# Patient Record
Sex: Male | Born: 1965 | Race: White | Hispanic: No | Marital: Married | Smoking: Never smoker
Health system: Southern US, Community
[De-identification: ages and names within clinical notes are randomized; demographics above are authoritative.]

## PROBLEM LIST (undated history)

## (undated) DIAGNOSIS — E785 Hyperlipidemia, unspecified: Secondary | ICD-10-CM

## (undated) DIAGNOSIS — I10 Essential (primary) hypertension: Secondary | ICD-10-CM

## (undated) DIAGNOSIS — E119 Type 2 diabetes mellitus without complications: Secondary | ICD-10-CM

## (undated) HISTORY — DX: Essential (primary) hypertension: I10

## (undated) HISTORY — DX: Hyperlipidemia, unspecified: E78.5

## (undated) HISTORY — DX: Type 2 diabetes mellitus without complications: E11.9

---

## 2010-07-31 DIAGNOSIS — I152 Hypertension secondary to endocrine disorders: Secondary | ICD-10-CM | POA: Insufficient documentation

## 2010-07-31 DIAGNOSIS — N138 Other obstructive and reflux uropathy: Secondary | ICD-10-CM | POA: Insufficient documentation

## 2010-07-31 DIAGNOSIS — N400 Enlarged prostate without lower urinary tract symptoms: Secondary | ICD-10-CM | POA: Insufficient documentation

## 2010-07-31 DIAGNOSIS — I10 Essential (primary) hypertension: Secondary | ICD-10-CM | POA: Insufficient documentation

## 2016-07-19 DIAGNOSIS — E1169 Type 2 diabetes mellitus with other specified complication: Secondary | ICD-10-CM | POA: Insufficient documentation

## 2016-07-19 DIAGNOSIS — E782 Mixed hyperlipidemia: Secondary | ICD-10-CM | POA: Insufficient documentation

## 2018-07-09 LAB — COLOGUARD: Cologuard: NEGATIVE

## 2019-12-28 DIAGNOSIS — E1141 Type 2 diabetes mellitus with diabetic mononeuropathy: Secondary | ICD-10-CM | POA: Insufficient documentation

## 2020-11-08 ENCOUNTER — Ambulatory Visit: Payer: Self-pay | Admitting: Family Medicine

## 2020-12-23 ENCOUNTER — Other Ambulatory Visit: Payer: Self-pay

## 2020-12-23 ENCOUNTER — Encounter: Payer: Self-pay | Admitting: Family Medicine

## 2020-12-23 ENCOUNTER — Ambulatory Visit (INDEPENDENT_AMBULATORY_CARE_PROVIDER_SITE_OTHER): Payer: 59 | Admitting: Family Medicine

## 2020-12-23 VITALS — BP 153/84 | HR 105 | Temp 98.2°F | Ht 67.0 in | Wt 177.6 lb

## 2020-12-23 DIAGNOSIS — E782 Mixed hyperlipidemia: Secondary | ICD-10-CM | POA: Diagnosis not present

## 2020-12-23 DIAGNOSIS — E1141 Type 2 diabetes mellitus with diabetic mononeuropathy: Secondary | ICD-10-CM

## 2020-12-23 DIAGNOSIS — I10 Essential (primary) hypertension: Secondary | ICD-10-CM

## 2020-12-23 LAB — BAYER DCA HB A1C WAIVED: HB A1C (BAYER DCA - WAIVED): 11.7 % — ABNORMAL HIGH (ref <7.0)

## 2020-12-23 MED ORDER — LOSARTAN POTASSIUM 100 MG PO TABS: 1.0000 | ORAL_TABLET | Freq: Every day | ORAL | 1 refills | Status: DC

## 2020-12-23 MED ORDER — GLIMEPIRIDE 2 MG PO TABS: 2.0000 mg | ORAL_TABLET | Freq: Two times a day (BID) | ORAL | 1 refills | Status: DC

## 2020-12-23 MED ORDER — DAPAGLIFLOZIN PROPANEDIOL 10 MG PO TABS: ORAL_TABLET | ORAL | 2 refills | Status: DC

## 2020-12-23 MED ORDER — AMLODIPINE BESYLATE 10 MG PO TABS: 1.0000 | ORAL_TABLET | Freq: Every day | ORAL | 1 refills | Status: DC

## 2020-12-23 MED ORDER — METFORMIN HCL ER 500 MG PO TB24
500.0000 mg | ORAL_TABLET | Freq: Two times a day (BID) | ORAL | 1 refills | Status: DC
Start: 2020-12-23 — End: 2021-06-20

## 2020-12-23 MED ORDER — ATORVASTATIN CALCIUM 20 MG PO TABS: 1.0000 | ORAL_TABLET | Freq: Every day | ORAL | 1 refills | Status: DC

## 2020-12-23 MED ORDER — HYDROCHLOROTHIAZIDE 25 MG PO TABS: 1.0000 | ORAL_TABLET | Freq: Every day | ORAL | 1 refills | Status: DC

## 2020-12-23 NOTE — Patient Instructions (Addendum)
Please monitor your blood pressure and blood sugar and keep a log to bring back with you.   Diabetes Mellitus and Nutrition, Adult When you have diabetes, or diabetes mellitus, it is very important to have healthy eating habits because your blood sugar (glucose) levels are greatly affected by what you eat and drink. Eating healthy foods in the right amounts, at about the same times every day, can help you:  Control your blood glucose.  Lower your risk of heart disease.  Improve your blood pressure.  Reach or maintain a healthy weight. What can affect my meal plan? Every person with diabetes is different, and each person has different needs for a meal plan. Your health care provider may recommend that you work with a dietitian to make a meal plan that is best for you. Your meal plan may vary depending on factors such as:  The calories you need.  The medicines you take.  Your weight.  Your blood glucose, blood pressure, and cholesterol levels.  Your activity level.  Other health conditions you have, such as heart or kidney disease. How do carbohydrates affect me? Carbohydrates, also called carbs, affect your blood glucose level more than any other type of food. Eating carbs naturally raises the amount of glucose in your blood. Carb counting is a method for keeping track of how many carbs you eat. Counting carbs is important to keep your blood glucose at a healthy level, especially if you use insulin or take certain oral diabetes medicines. It is important to know how many carbs you can safely have in each meal. This is different for every person. Your dietitian can help you calculate how many carbs you should have at each meal and for each snack. How does alcohol affect me? Alcohol can cause a sudden decrease in blood glucose (hypoglycemia), especially if you use insulin or take certain oral diabetes medicines. Hypoglycemia can be a life-threatening condition. Symptoms of hypoglycemia,  such as sleepiness, dizziness, and confusion, are similar to symptoms of having too much alcohol.  Do not drink alcohol if: ? Your health care provider tells you not to drink. ? You are pregnant, may be pregnant, or are planning to become pregnant.  If you drink alcohol: ? Do not drink on an empty stomach. ? Limit how much you use to:  0-1 drink a day for women.  0-2 drinks a day for men. ? Be aware of how much alcohol is in your drink. In the [place] one drink equals one 12 oz bottle of beer (355 mL), one 5 oz glass of wine (148 mL), or one 1 oz glass of hard liquor (44 mL). ? Keep yourself hydrated with water, diet soda, or unsweetened iced tea.  Keep in mind that regular soda, juice, and other mixers may contain a lot of sugar and must be counted as carbs. What are tips for following this plan? Reading food labels  Start by checking the serving size on the "Nutrition Facts" label of packaged foods and drinks. The amount of calories, carbs, fats, and other nutrients listed on the label is based on one serving of the item. Many items contain more than one serving per package.  Check the total grams (g) of carbs in one serving. You can calculate the number of servings of carbs in one serving by dividing the total carbs by 15. For example, if a food has 30 g of total carbs per serving, it would be equal to 2 servings of carbs.  Check the number of grams (g) of saturated fats and trans fats in one serving. Choose foods that have a low amount or none of these fats.  Check the number of milligrams (mg) of salt (sodium) in one serving. Most people should limit total sodium intake to less than 2,300 mg per day.  Always check the nutrition information of foods labeled as "low-fat" or "nonfat." These foods may be higher in added sugar or refined carbs and should be avoided.  Talk to your dietitian to identify your daily goals for nutrients listed on the label. Shopping  Avoid buying canned,  pre-made, or processed foods. These foods tend to be high in fat, sodium, and added sugar.  Shop around the outside edge of the grocery store. This is where you will most often find fresh fruits and vegetables, bulk grains, fresh meats, and fresh dairy. Cooking  Use low-heat cooking methods, such as baking, instead of high-heat cooking methods like deep frying.  Cook using healthy oils, such as olive, canola, or sunflower oil.  Avoid cooking with butter, cream, or high-fat meats. Meal planning  Eat meals and snacks regularly, preferably at the same times every day. Avoid going long periods of time without eating.  Eat foods that are high in fiber, such as fresh fruits, vegetables, beans, and whole grains. Talk with your dietitian about how many servings of carbs you can eat at each meal.  Eat 4-6 oz (112-168 g) of lean protein each day, such as lean meat, chicken, fish, eggs, or tofu. One ounce (oz) of lean protein is equal to: ? 1 oz (28 g) of meat, chicken, or fish. ? 1 egg. ?  cup (62 g) of tofu.  Eat some foods each day that contain healthy fats, such as avocado, nuts, seeds, and fish.   What foods should I eat? Fruits Berries. Apples. Oranges. Peaches. Apricots. Plums. Grapes. Mango. Papaya. Pomegranate. Kiwi. Cherries. Vegetables Lettuce. Spinach. Leafy greens, including kale, chard, collard greens, and mustard greens. Beets. Cauliflower. Cabbage. Broccoli. Carrots. Green beans. Tomatoes. Peppers. Onions. Cucumbers. Brussels sprouts. Grains Whole grains, such as whole-wheat or whole-grain bread, crackers, tortillas, cereal, and pasta. Unsweetened oatmeal. Quinoa. Brown or wild rice. Meats and other proteins Seafood. Poultry without skin. Lean cuts of poultry and beef. Tofu. Nuts. Seeds. Dairy Low-fat or fat-free dairy products such as milk, yogurt, and cheese. The items listed above may not be a complete list of foods and beverages you can eat. Contact a dietitian for more  information. What foods should I avoid? Fruits Fruits canned with syrup. Vegetables Canned vegetables. Frozen vegetables with butter or cream sauce. Grains Refined white flour and flour products such as bread, pasta, snack foods, and cereals. Avoid all processed foods. Meats and other proteins Fatty cuts of meat. Poultry with skin. Breaded or fried meats. Processed meat. Avoid saturated fats. Dairy Full-fat yogurt, cheese, or milk. Beverages Sweetened drinks, such as soda or iced tea. The items listed above may not be a complete list of foods and beverages you should avoid. Contact a dietitian for more information. Questions to ask a health care provider  Do I need to meet with a diabetes educator?  Do I need to meet with a dietitian?  What number can I call if I have questions?  When are the best times to check my blood glucose? Where to find more information:  American Diabetes Association: diabetes.org  Academy of Nutrition and Dietetics: www.eatright.[employer] of Diabetes and Digestive and Kidney Diseases:  [employer]  Association of Diabetes Care and Education Specialists: www.diabeteseducator.org Summary  It is important to have healthy eating habits because your blood sugar (glucose) levels are greatly affected by what you eat and drink.  A healthy meal plan will help you control your blood glucose and maintain a healthy lifestyle.  Your health care provider may recommend that you work with a dietitian to make a meal plan that is best for you.  Keep in mind that carbohydrates (carbs) and alcohol have immediate effects on your blood glucose levels. It is important to count carbs and to use alcohol carefully. This information is not intended to replace advice given to you by your health care provider. Make sure you discuss any questions you have with your health care provider. Document Revised: 07/28/2019 Document Reviewed: 07/28/2019 Elsevier  Patient Education  2021 [employer].

## 2020-12-23 NOTE — Progress Notes (Signed)
New Patient Office Visit  Assessment & Plan:  1. Type 2 diabetes mellitus with diabetic mononeuropathy, without long-term current use of insulin (HCC) Lab Results  Component Value Date   HGBA1C 11.7 (H) 12/23/2020    - Diabetes is not at goal of A1c < 7. - Medications: continue current medications, add [place] glucose monitoring: Continue monitoring - Patient is currently taking a statin. Patient is taking an ACE-inhibitor/ARB.  - Instruction/counseling given: discussed diet and provided printed educational material  Diabetes Health Maintenance Due  Topic Date Due  . FOOT EXAM  Never done  . OPHTHALMOLOGY EXAM  Never done  . HEMOGLOBIN A1C  06/24/2021    - metFORMIN (GLUCOPHAGE-XR) 500 MG 24 hr tablet; Take 1 tablet (500 mg total) by mouth 2 (two) times daily.  Dispense: 180 tablet; Refill: 1 - glimepiride (AMARYL) 2 MG tablet; Take 1 tablet (2 mg total) by mouth in the morning and at bedtime.  Dispense: 90 tablet; Refill: 1 - dapagliflozin propanediol (FARXIGA) 10 MG TABS tablet; Take 5 mg by mouth once daily x1 week, then increase to 10 mg once daily.  Dispense: 30 tablet; Refill: 2 - CBC with Differential/Platelet - CMP14+EGFR - Lipid panel - Vitamin B12 - Bayer DCA Hb A1c Waived  2. Essential hypertension Uncontrolled.  Patient to monitor his blood pressure at home and keep a log to bring back with him to his next appointment. - amLODipine (NORVASC) 10 MG tablet; Take 1 tablet (10 mg total) by mouth daily.  Dispense: 90 tablet; Refill: 1 - hydrochlorothiazide (HYDRODIURIL) 25 MG tablet; Take 1 tablet (25 mg total) by mouth daily.  Dispense: 90 tablet; Refill: 1 - losartan (COZAAR) 100 MG tablet; Take 1 tablet (100 mg total) by mouth daily.  Dispense: 90 tablet; Refill: 1 - CBC with Differential/Platelet - CMP14+EGFR - Lipid panel  3. Mixed hyperlipidemia Labs to assess. - atorvastatin (LIPITOR) 20 MG tablet; Take 1 tablet (20 mg total) by mouth daily.  Dispense:  90 tablet; Refill: 1 - CMP14+EGFR - Lipid panel   Follow-up: Return in about 6 weeks (around 02/03/2021) for DM, HTN.   Hendricks Limes, MSN, APRN, FNP-C [facility]  Subjective:  Patient ID: Ranulfo Kall, male    DOB: 11/30/1965  Age: 55 y.o. MRN: [id number]  Patient Care Team: Loman Brooklyn, FNP as PCP - General (Family Medicine)  CC:  Chief Complaint  Patient presents with  . New Patient (Initial Visit)    [facility]  . Establish Care    HPI Wilberth Damon presents to establish care.   Diabetes: Current symptoms include: hyperglycemia. Known diabetic complications: none. Medication compliance: Taking metformin 500 mg twice daily and glimepiride 2 mg twice daily.  He does not tolerate higher doses of metformin due to diarrhea. Current diet: in general, a "healthy" diet  . Current exercise: reports he is increasing his exercise now that it is getting warm. Home blood sugar records: high - 266 yesterday. Is he  on ACE inhibitor or angiotensin II receptor blocker? Yes. Is he on a statin? Yes.    Hypertension: Patient does check his blood pressure at home and states it is normally 140s/85.  He does not add salt to his food.  He is starting to exercise more now that it is getting warm out.   Review of Systems  Constitutional: Negative for chills, fever, malaise/fatigue and weight loss.  HENT: Negative for congestion, ear discharge, ear pain, nosebleeds, sinus pain, sore throat and tinnitus.  Eyes: Negative for blurred vision, double vision, pain, discharge and redness.  Respiratory: Negative for cough, shortness of breath and wheezing.   Cardiovascular: Negative for chest pain, palpitations and leg swelling.  Gastrointestinal: Negative for abdominal pain, constipation, diarrhea, heartburn, nausea and vomiting.  Genitourinary: Negative for dysuria, frequency and urgency.  Musculoskeletal: Negative for myalgias.  Skin: Negative for rash.  Neurological: Negative for  dizziness, seizures, weakness and headaches.  Psychiatric/Behavioral: Negative for depression, substance abuse and suicidal ideas. The patient is not nervous/anxious.     Current Outpatient Medications:  .  amLODipine (NORVASC) 10 MG tablet, Take 1 tablet by mouth daily., Disp: , Rfl:  .  aspirin 81 MG EC tablet, Take 1 tablet by mouth daily., Disp: , Rfl:  .  atorvastatin (LIPITOR) 20 MG tablet, Take 1 tablet by mouth daily., Disp: , Rfl:  .  glimepiride (AMARYL) 4 MG tablet, Take 4 mg by mouth 2 (two) times daily., Disp: , Rfl:  .  hydrochlorothiazide (HYDRODIURIL) 25 MG tablet, Take 1 tablet by mouth daily at 2 PM., Disp: , Rfl:  .  losartan (COZAAR) 100 MG tablet, Take 1 tablet by mouth daily., Disp: , Rfl:  .  metFORMIN (GLUCOPHAGE-XR) 500 MG 24 hr tablet, Take 500 mg by mouth 2 (two) times daily., Disp: , Rfl:   Allergies  Allergen Reactions  . Ace Inhibitors Swelling and Other (See Comments)    Tongue swelling and cough.     Past Medical History:  Diagnosis Date  . Diabetes mellitus without complication ([place])   . Hyperlipidemia   . Hypertension     History reviewed. No pertinent surgical history.  Family History  Problem Relation Age of Onset  . Hypertension Mother   . Heart disease Sister   . Heart attack Maternal Grandfather     Social History   Socioeconomic History  . Marital status: Married    Spouse name: Not on file  . Number of children: Not on file  . Years of education: Not on file  . Highest education level: Not on file  Occupational History  . Not on file  Tobacco Use  . Smoking status: Never Smoker  . Smokeless tobacco: Never Used  Vaping Use  . Vaping Use: Never used  Substance and Sexual Activity  . Alcohol use: Never  . Drug use: Never  . Sexual activity: Yes  Other Topics Concern  . Not on file  Social History Narrative  . Not on file   Social Determinants of Health   Financial Resource Strain: Not on file  Food Insecurity: Not on  file  Transportation Needs: Not on file  Physical Activity: Not on file  Stress: Not on file  Social Connections: Not on file  Intimate Partner Violence: Not on file    Objective:   Today's Vitals: BP (!) 153/84   Pulse (!) 105   Temp 98.2 F (36.8 C) (Temporal)   Ht '5\' 7"'  (1.702 m)   Wt 177 lb 9.6 oz (80.6 kg)   SpO2 98%   BMI 27.82 kg/m   Physical Exam Vitals reviewed.  Constitutional:      General: He is not in acute distress.    Appearance: Normal appearance. He is overweight. He is not ill-appearing, toxic-appearing or diaphoretic.  HENT:     Head: Normocephalic and atraumatic.  Eyes:     General: No scleral icterus.       Right eye: No discharge.        Left eye: No  discharge.     Conjunctiva/sclera: Conjunctivae normal.  Cardiovascular:     Rate and Rhythm: Normal rate and regular rhythm.     Heart sounds: Normal heart sounds. No murmur heard. No friction rub. No gallop.   Pulmonary:     Effort: Pulmonary effort is normal. No respiratory distress.     Breath sounds: Normal breath sounds. No stridor. No wheezing, rhonchi or rales.  Musculoskeletal:        General: Normal range of motion.     Cervical back: Normal range of motion.  Skin:    General: Skin is warm and dry.  Neurological:     Mental Status: He is alert and oriented to person, place, and time. Mental status is at baseline.  Psychiatric:        Mood and Affect: Mood normal.        Behavior: Behavior normal.        Thought Content: Thought content normal.        Judgment: Judgment normal.

## 2020-12-24 LAB — CBC WITH DIFFERENTIAL/PLATELET
Basophils Absolute: 0.1 10*3/uL (ref 0.0–0.2)
Basos: 1 %
EOS (ABSOLUTE): 0.1 10*3/uL (ref 0.0–0.4)
Eos: 1 %
Hematocrit: 48.4 % (ref 37.5–51.0)
Hemoglobin: 16.6 g/dL (ref 13.0–17.7)
Immature Grans (Abs): 0 10*3/uL (ref 0.0–0.1)
Immature Granulocytes: 1 %
Lymphocytes Absolute: 1.4 10*3/uL (ref 0.7–3.1)
Lymphs: 17 %
MCH: 29.7 pg (ref 26.6–33.0)
MCHC: 34.3 g/dL (ref 31.5–35.7)
MCV: 87 fL (ref 79–97)
Monocytes Absolute: 0.6 10*3/uL (ref 0.1–0.9)
Monocytes: 7 %
Neutrophils Absolute: 6.4 10*3/uL (ref 1.4–7.0)
Neutrophils: 73 %
Platelets: 273 10*3/uL (ref 150–450)
RBC: 5.58 x10E6/uL (ref 4.14–5.80)
RDW: 12.2 % (ref 11.6–15.4)
WBC: 8.7 10*3/uL (ref 3.4–10.8)

## 2020-12-24 LAB — CMP14+EGFR
ALT: 26 IU/L (ref 0–44)
AST: 16 IU/L (ref 0–40)
Albumin/Globulin Ratio: 1.8 (ref 1.2–2.2)
Albumin: 5.1 g/dL — ABNORMAL HIGH (ref 3.8–4.9)
Alkaline Phosphatase: 72 IU/L (ref 44–121)
BUN/Creatinine Ratio: 15 (ref 9–20)
BUN: 16 mg/dL (ref 6–24)
Bilirubin Total: 0.5 mg/dL (ref 0.0–1.2)
CO2: 21 mmol/L (ref 20–29)
Calcium: 10.4 mg/dL — ABNORMAL HIGH (ref 8.7–10.2)
Chloride: 95 mmol/L — ABNORMAL LOW (ref 96–106)
Creatinine, Ser: 1.1 mg/dL (ref 0.76–1.27)
Globulin, Total: 2.8 g/dL (ref 1.5–4.5)
Glucose: 391 mg/dL — ABNORMAL HIGH (ref 65–99)
Potassium: 4.8 mmol/L (ref 3.5–5.2)
Sodium: 137 mmol/L (ref 134–144)
Total Protein: 7.9 g/dL (ref 6.0–8.5)
eGFR: 80 mL/min/{1.73_m2} (ref >59)

## 2020-12-24 LAB — LIPID PANEL
Chol/HDL Ratio: 7.3 ratio — ABNORMAL HIGH (ref 0.0–5.0)
Cholesterol, Total: 242 mg/dL — ABNORMAL HIGH (ref 100–199)
HDL: 33 mg/dL — ABNORMAL LOW (ref >39)
LDL Chol Calc (NIH): 156 mg/dL — ABNORMAL HIGH (ref 0–99)
Triglycerides: 285 mg/dL — ABNORMAL HIGH (ref 0–149)
VLDL Cholesterol Cal: 53 mg/dL — ABNORMAL HIGH (ref 5–40)

## 2020-12-24 LAB — VITAMIN B12: Vitamin B-12: 1208 pg/mL (ref 232–1245)

## 2020-12-26 ENCOUNTER — Other Ambulatory Visit: Payer: Self-pay | Admitting: Family Medicine

## 2020-12-26 DIAGNOSIS — E782 Mixed hyperlipidemia: Secondary | ICD-10-CM

## 2020-12-26 MED ORDER — ATORVASTATIN CALCIUM 40 MG PO TABS: 40.0000 mg | ORAL_TABLET | Freq: Every day | ORAL | 2 refills | Status: DC

## 2020-12-26 NOTE — Progress Notes (Signed)
ORDER INFORMATION Patient: Matthew Mann, Matthew Mann Date of Birth: 1966/06/24 Medical Record #: [id number]  Sex: Male Authorizing Provider: Georgeanne Nim, NP Client Order ID: [id number] Report Date: 07/23/2018 Specimen ID: [id number] Specimen Collected: 07/09/2018 7:20 AM Specimen Received: 07/09/2018 Specimen Type: Stool Specimen Source: Per Rectum       Cologuard Order: [id number] Status: Final result   0 Result Notes     1                 Ref Range & Units 07/09/18  7:20 AM Test Result Not Applicable Negative   Results Negative per cologuard

## 2021-01-14 ENCOUNTER — Other Ambulatory Visit: Payer: Self-pay | Admitting: Family Medicine

## 2021-01-14 DIAGNOSIS — E1141 Type 2 diabetes mellitus with diabetic mononeuropathy: Secondary | ICD-10-CM

## 2021-01-30 ENCOUNTER — Other Ambulatory Visit: Payer: Self-pay | Admitting: Family Medicine

## 2021-01-30 DIAGNOSIS — E1141 Type 2 diabetes mellitus with diabetic mononeuropathy: Secondary | ICD-10-CM

## 2021-02-03 ENCOUNTER — Ambulatory Visit: Payer: 59 | Admitting: Family Medicine

## 2021-02-08 ENCOUNTER — Encounter: Payer: Self-pay | Admitting: Family Medicine

## 2021-02-08 DIAGNOSIS — E1141 Type 2 diabetes mellitus with diabetic mononeuropathy: Secondary | ICD-10-CM

## 2021-02-08 DIAGNOSIS — E782 Mixed hyperlipidemia: Secondary | ICD-10-CM

## 2021-02-08 MED ORDER — DAPAGLIFLOZIN PROPANEDIOL 10 MG PO TABS: ORAL_TABLET | ORAL | 0 refills | Status: DC

## 2021-02-08 MED ORDER — ATORVASTATIN CALCIUM 20 MG PO TABS: 20.0000 mg | ORAL_TABLET | Freq: Every day | ORAL | 0 refills | Status: DC

## 2021-02-21 ENCOUNTER — Other Ambulatory Visit: Payer: Self-pay

## 2021-02-21 ENCOUNTER — Encounter: Payer: Self-pay | Admitting: Family Medicine

## 2021-02-21 ENCOUNTER — Ambulatory Visit (INDEPENDENT_AMBULATORY_CARE_PROVIDER_SITE_OTHER): Payer: 59 | Admitting: Family Medicine

## 2021-02-21 VITALS — BP 154/92 | HR 90 | Temp 97.3°F | Ht 67.0 in | Wt 174.4 lb

## 2021-02-21 DIAGNOSIS — I10 Essential (primary) hypertension: Secondary | ICD-10-CM

## 2021-02-21 DIAGNOSIS — B349 Viral infection, unspecified: Secondary | ICD-10-CM

## 2021-02-21 DIAGNOSIS — E1141 Type 2 diabetes mellitus with diabetic mononeuropathy: Secondary | ICD-10-CM

## 2021-02-21 MED ORDER — GLIMEPIRIDE 4 MG PO TABS: 4.0000 mg | ORAL_TABLET | Freq: Two times a day (BID) | ORAL | 1 refills | Status: DC

## 2021-02-21 NOTE — Progress Notes (Signed)
Assessment & Plan:  1. Type 2 diabetes mellitus with diabetic mononeuropathy, without long-term current use of insulin (HCC) Lab Results  Component Value Date   HGBA1C 11.7 (H) 12/23/2020    - Diabetes is not at goal of A1c < 7. - Medications: continue current medications. Patient declines a medication change until follow-up A1c.  - Home glucose monitoring: continue monitoring - Patient is currently taking a statin. Patient is taking an ACE-inhibitor/ARB.  - Instruction/counseling given: reminded to get eye exam and discussed foot care  Diabetes Health Maintenance Due  Topic Date Due   OPHTHALMOLOGY EXAM  Never done   HEMOGLOBIN A1C  06/24/2021   FOOT EXAM  02/21/2022    - glimepiride (AMARYL) 4 MG tablet; Take 1 tablet (4 mg total) by mouth in the morning and at bedtime.  Dispense: 180 tablet; Refill: 1  2. Essential hypertension Well controlled by home readings.   3. Viral illness Discussed viral illness. Does not sound related to tick bite.    Return in about 2 months (around 04/23/2021) for DM.  Hendricks Limes, MSN, APRN, FNP-C [facility]  Subjective:    Patient ID: Matthew Mann, male    DOB: May 26, 1966, 55 y.o.   MRN: [id number]  Patient Care Team: Loman Brooklyn, FNP as PCP - General (Family Medicine)   Chief Complaint:  Chief Complaint  Patient presents with   Diabetes   Hypertension    6 week follow up     HPI: Matthew Mann is a 55 y.o. male presenting on 02/21/2021 for Diabetes and Hypertension (6 week follow up )  Diabetes: Patient presents for follow up of diabetes. Current symptoms include: hyperglycemia. Known diabetic complications: none. Medication compliance: yes. Current diet: in general, a "healthy" diet  . Current exercise: bicycling. Home blood sugar records:  mostly <200 since starting Farxiga; none over 300; 130 was the best reading . Is he  on ACE inhibitor or angiotensin II receptor blocker? Yes. Is he on a statin? Yes.    Hypertension: patient does check his blood pressure at home and reports readings 120-130/70-85. States he is always higher at the doctor's office.  New complaints: Patient reports he has had a sore throat intermittently over the past month. None for the past week. He had a fever x2 days in the past month but not at the same time as his sore throat. His grandson lives with him and goes to daycare; states he is always bringing illnesses home. He has got multiple ticks off of him this year but denies headaches, nausea, vomiting, rash, or increased joint pain.   Social history:  Relevant past medical, surgical, family and social history reviewed and updated as indicated. Interim medical history since our last visit reviewed.  Allergies and medications reviewed and updated.  DATA REVIEWED: CHART IN EPIC  ROS: Negative unless specifically indicated above in HPI.    Current Outpatient Medications:    amLODipine (NORVASC) 10 MG tablet, Take 1 tablet (10 mg total) by mouth daily., Disp: 90 tablet, Rfl: 1   aspirin 81 MG EC tablet, Take 1 tablet by mouth daily., Disp: , Rfl:    atorvastatin (LIPITOR) 20 MG tablet, Take 1 tablet (20 mg total) by mouth daily., Disp: 90 tablet, Rfl: 0   dapagliflozin propanediol (FARXIGA) 10 MG TABS tablet, Take 5 mg by mouth once daily x1 week, then increase to 10 mg once daily., Disp: 90 tablet, Rfl: 0   glimepiride (AMARYL) 2 MG tablet, TAKE 1  TABLET (2 MG TOTAL) BY MOUTH IN THE MORNING AND AT BEDTIME., Disp: 180 tablet, Rfl: 1   hydrochlorothiazide (HYDRODIURIL) 25 MG tablet, Take 1 tablet (25 mg total) by mouth daily., Disp: 90 tablet, Rfl: 1   losartan (COZAAR) 100 MG tablet, Take 1 tablet (100 mg total) by mouth daily., Disp: 90 tablet, Rfl: 1   metFORMIN (GLUCOPHAGE-XR) 500 MG 24 hr tablet, Take 1 tablet (500 mg total) by mouth 2 (two) times daily., Disp: 180 tablet, Rfl: 1   Allergies  Allergen Reactions   Ace Inhibitors Swelling and Other (See Comments)     Tongue swelling and cough.    Past Medical History:  Diagnosis Date   Diabetes mellitus without complication ([place])    Hyperlipidemia    Hypertension     History reviewed. No pertinent surgical history.  Social History   Socioeconomic History   Marital status: Married    Spouse name: Not on file   Number of children: Not on file   Years of education: Not on file   Highest education level: Not on file  Occupational History   Not on file  Tobacco Use   Smoking status: Never   Smokeless tobacco: Never  Vaping Use   Vaping Use: Never used  Substance and Sexual Activity   Alcohol use: Never   Drug use: Never   Sexual activity: Yes  Other Topics Concern   Not on file  Social History Narrative   Not on file   Social Determinants of Health   Financial Resource Strain: Not on file  Food Insecurity: Not on file  Transportation Needs: Not on file  Physical Activity: Not on file  Stress: Not on file  Social Connections: Not on file  Intimate Partner Violence: Not on file        Objective:    BP (!) 154/92   Pulse 90   Temp (!) 97.3 F (36.3 C) (Temporal)   Ht '5\' 7"'  (1.702 m)   Wt 174 lb 6.4 oz (79.1 kg)   SpO2 100%   BMI 27.31 kg/m   Wt Readings from Last 3 Encounters:  02/21/21 174 lb 6.4 oz (79.1 kg)  12/23/20 177 lb 9.6 oz (80.6 kg)    Physical Exam Vitals reviewed.  Constitutional:      General: He is not in acute distress.    Appearance: Normal appearance. He is not ill-appearing, toxic-appearing or diaphoretic.  HENT:     Head: Normocephalic and atraumatic.  Eyes:     General: No scleral icterus.       Right eye: No discharge.        Left eye: No discharge.     Conjunctiva/sclera: Conjunctivae normal.  Cardiovascular:     Rate and Rhythm: Normal rate and regular rhythm.     Heart sounds: Normal heart sounds. No murmur heard.   No friction rub. No gallop.  Pulmonary:     Effort: Pulmonary effort is normal. No respiratory distress.      Breath sounds: Normal breath sounds. No stridor. No wheezing, rhonchi or rales.  Musculoskeletal:        General: Normal range of motion.     Cervical back: Normal range of motion.  Skin:    General: Skin is warm and dry.     Comments: Area of tick bite to the left side of his abdomen with mild reactive erythema. No s/s of infection.  Neurological:     Mental Status: He is alert and oriented to person,  place, and time. Mental status is at baseline.  Psychiatric:        Mood and Affect: Mood normal.        Behavior: Behavior normal.        Thought Content: Thought content normal.        Judgment: Judgment normal.    No results found for: TSH Lab Results  Component Value Date   WBC 8.7 12/23/2020   HGB 16.6 12/23/2020   HCT 48.4 12/23/2020   MCV 87 12/23/2020   PLT 273 12/23/2020   Lab Results  Component Value Date   NA 137 12/23/2020   K 4.8 12/23/2020   CO2 21 12/23/2020   GLUCOSE 391 (H) 12/23/2020   BUN 16 12/23/2020   CREATININE 1.10 12/23/2020   BILITOT 0.5 12/23/2020   ALKPHOS 72 12/23/2020   AST 16 12/23/2020   ALT 26 12/23/2020   PROT 7.9 12/23/2020   ALBUMIN 5.1 (H) 12/23/2020   CALCIUM 10.4 (H) 12/23/2020   EGFR 80 12/23/2020   Lab Results  Component Value Date   CHOL 242 (H) 12/23/2020   Lab Results  Component Value Date   HDL 33 (L) 12/23/2020   Lab Results  Component Value Date   LDLCALC 156 (H) 12/23/2020   Lab Results  Component Value Date   TRIG 285 (H) 12/23/2020   Lab Results  Component Value Date   CHOLHDL 7.3 (H) 12/23/2020   Lab Results  Component Value Date   HGBA1C 11.7 (H) 12/23/2020

## 2021-04-24 ENCOUNTER — Ambulatory Visit: Payer: 59 | Admitting: Family Medicine

## 2021-04-27 ENCOUNTER — Other Ambulatory Visit: Payer: Self-pay

## 2021-04-27 ENCOUNTER — Encounter: Payer: Self-pay | Admitting: Family Medicine

## 2021-04-27 ENCOUNTER — Ambulatory Visit (INDEPENDENT_AMBULATORY_CARE_PROVIDER_SITE_OTHER): Payer: 59 | Admitting: Family Medicine

## 2021-04-27 VITALS — BP 125/86 | HR 93 | Temp 97.6°F | Ht 67.0 in | Wt 172.0 lb

## 2021-04-27 DIAGNOSIS — M5431 Sciatica, right side: Secondary | ICD-10-CM | POA: Diagnosis not present

## 2021-04-27 DIAGNOSIS — E1141 Type 2 diabetes mellitus with diabetic mononeuropathy: Secondary | ICD-10-CM | POA: Diagnosis not present

## 2021-04-27 DIAGNOSIS — I10 Essential (primary) hypertension: Secondary | ICD-10-CM | POA: Diagnosis not present

## 2021-04-27 DIAGNOSIS — E782 Mixed hyperlipidemia: Secondary | ICD-10-CM | POA: Diagnosis not present

## 2021-04-27 LAB — BAYER DCA HB A1C WAIVED: HB A1C (BAYER DCA - WAIVED): 8.9 % — ABNORMAL HIGH (ref <7.0)

## 2021-04-27 MED ORDER — PREDNISONE 10 MG (21) PO TBPK: ORAL_TABLET | ORAL | 0 refills | Status: DC

## 2021-04-27 NOTE — Progress Notes (Signed)
Assessment & Plan:  1. Type 2 diabetes mellitus with diabetic mononeuropathy, without long-term current use of insulin (HCC) Lab Results  Component Value Date   HGBA1C 8.9 (H) 04/27/2021   HGBA1C 11.7 (H) 12/23/2020    - Diabetes is not at goal of A1c < 7, but is improving. - Medications: continue current medications. Strongly encouraged adding a GLP or new oral medication but patient is not agreeable to adding a medication as he wants to finish getting his A1c down with diet. - Home glucose monitoring: Continue monitoring. - Patient is currently taking a statin. Patient is taking an ACE-inhibitor/ARB.  - Instruction/counseling given: reminded to get eye exam  Diabetes Health Maintenance Due  Topic Date Due   OPHTHALMOLOGY EXAM  Never done   HEMOGLOBIN A1C  10/28/2021   FOOT EXAM  02/21/2022    - Lipid panel - CBC with Differential/Platelet - CMP14+EGFR - Bayer DCA Hb A1c Waived  2. Essential hypertension Well controlled on current regimen per home readings. - Lipid panel - CBC with Differential/Platelet - CMP14+EGFR  3. Mixed hyperlipidemia Labs to assess since increasing Atorvastatin from 20 mg to 40 mg once daily. - Lipid panel - CMP14+EGFR  4. Right sided sciatica Education provided on sciatica. - predniSONE (STERAPRED UNI-PAK 21 TAB) 10 MG (21) TBPK tablet; As directed x 6 days  Dispense: 21 tablet; Refill: 0   Return in about 3 months (around 07/28/2021) for annual physical.  Matthew Limes, MSN, APRN, FNP-C Matthew Mann Matthew Mann  Subjective:    Patient ID: Matthew Mann, male    DOB: 07/13/1966, 55 y.o.   MRN: [id number]  Patient Care Team: Matthew Brooklyn, FNP as PCP - General (Matthew Mann) [place]   Chief Complaint:  Chief Complaint  Patient presents with   Diabetes   Hypertension    6 week follow up   Leg Pain    Patient states he has been having right leg pain x1 months. Pain goes down leg.     HPI: Matthew Mann is a  55 y.o. male presenting on 04/27/2021 for Diabetes, Hypertension (6 week follow up), and Leg Pain (Patient states he has been having right leg pain x1 months. Pain goes down leg. )  Diabetes with hypertension and hyperlipidemia: Patient presents for follow up of diabetes. Current symptoms include: hyperglycemia. Known diabetic complications: none. Medication compliance: yes. Current diet:  doing better cutting out sweets and not snacking in the evening . Current exercise:  "some" . Home blood sugar records: BGs range between 150 and 230 . Is he  on ACE inhibitor or angiotensin II receptor blocker? Yes. Is he on a statin? Yes.   Hypertension: patient does check his blood pressure at home and reports readings 120-130/70-85. States he is always higher at the doctor's office. He was 125/86 at home this morning.  New complaints: Patient reports right sided sciatic pain x1 month that started after he did a lot of walking. He has been trying to do exercises at home to help and taking Ibuprofen which is somewhat helpful.  Patient reports having COVID a few weeks ago.   Social history:  Relevant past medical, surgical, Matthew and social history reviewed and updated as indicated. Interim medical history since our last visit reviewed.  Allergies and medications reviewed and updated.  DATA REVIEWED: CHART IN EPIC  ROS: Negative unless specifically indicated above in HPI.    Current Outpatient Medications:    amLODipine (NORVASC) 10 MG tablet, Take 1 tablet (  10 mg total) by mouth daily., Disp: 90 tablet, Rfl: 1   aspirin 81 MG EC tablet, Take 1 tablet by mouth daily., Disp: , Rfl:    atorvastatin (LIPITOR) 20 MG tablet, Take 1 tablet (20 mg total) by mouth daily., Disp: 90 tablet, Rfl: 0   dapagliflozin propanediol (FARXIGA) 10 MG TABS tablet, Take 5 mg by mouth once daily x1 week, then increase to 10 mg once daily., Disp: 90 tablet, Rfl: 0   glimepiride (AMARYL) 4 MG tablet, Take 1 tablet (4 mg total)  by mouth in the morning and at bedtime., Disp: 180 tablet, Rfl: 1   hydrochlorothiazide (HYDRODIURIL) 25 MG tablet, Take 1 tablet (25 mg total) by mouth daily., Disp: 90 tablet, Rfl: 1   losartan (COZAAR) 100 MG tablet, Take 1 tablet (100 mg total) by mouth daily., Disp: 90 tablet, Rfl: 1   metFORMIN (GLUCOPHAGE-XR) 500 MG 24 hr tablet, Take 1 tablet (500 mg total) by mouth 2 (two) times daily., Disp: 180 tablet, Rfl: 1   Allergies  Allergen Reactions   Ace Inhibitors Swelling and Other (See Comments)    Tongue swelling and cough.    Past Medical History:  Diagnosis Date   Diabetes mellitus without complication ([place])    Hyperlipidemia    Hypertension     History reviewed. No pertinent surgical history.  Social History   Socioeconomic History   Marital status: Married    Spouse name: Not on file   Number of children: Not on file   Years of education: Not on file   Highest education level: Not on file  Occupational History   Not on file  Tobacco Use   Smoking status: Never   Smokeless tobacco: Never  Vaping Use   Vaping Use: Never used  Substance and Sexual Activity   Alcohol use: Never   Drug use: Never   Sexual activity: Yes  Other Topics Concern   Not on file  Social History Narrative   Not on file   Social Determinants of Health   Financial Resource Strain: Not on file  Food Insecurity: Not on file  Transportation Needs: Not on file  Physical Activity: Not on file  Stress: Not on file  Social Connections: Not on file  Intimate Partner Violence: Not on file        Objective:    BP 125/86   Pulse 93   Temp 97.6 F (36.4 C) (Temporal)   Ht '5\' 7"'  (1.702 m)   Wt 172 lb (78 kg)   SpO2 100%   BMI 26.94 kg/m   Wt Readings from Last 3 Encounters:  04/27/21 172 lb (78 kg)  02/21/21 174 lb 6.4 oz (79.1 kg)  12/23/20 177 lb 9.6 oz (80.6 kg)    Physical Exam Vitals reviewed.  Constitutional:      General: He is not in acute distress.    Appearance:  Normal appearance. He is overweight. He is not ill-appearing, toxic-appearing or diaphoretic.  HENT:     Head: Normocephalic and atraumatic.  Eyes:     General: No scleral icterus.       Right eye: No discharge.        Left eye: No discharge.     Conjunctiva/sclera: Conjunctivae normal.  Cardiovascular:     Rate and Rhythm: Normal rate and regular rhythm.     Heart sounds: Normal heart sounds. No murmur heard.   No friction rub. No gallop.  Pulmonary:     Effort: Pulmonary effort is normal.  No respiratory distress.     Breath sounds: Normal breath sounds. No stridor. No wheezing, rhonchi or rales.  Musculoskeletal:        General: Normal range of motion.     Cervical back: Normal range of motion.  Skin:    General: Skin is warm and dry.  Neurological:     Mental Status: He is alert and oriented to person, place, and time. Mental status is at baseline.  Psychiatric:        Mood and Affect: Mood normal.        Behavior: Behavior normal.        Thought Content: Thought content normal.        Judgment: Judgment normal.    No results found for: TSH Lab Results  Component Value Date   WBC 8.7 12/23/2020   HGB 16.6 12/23/2020   HCT 48.4 12/23/2020   MCV 87 12/23/2020   PLT 273 12/23/2020   Lab Results  Component Value Date   NA 137 12/23/2020   K 4.8 12/23/2020   CO2 21 12/23/2020   GLUCOSE 391 (H) 12/23/2020   BUN 16 12/23/2020   CREATININE 1.10 12/23/2020   BILITOT 0.5 12/23/2020   ALKPHOS 72 12/23/2020   AST 16 12/23/2020   ALT 26 12/23/2020   PROT 7.9 12/23/2020   ALBUMIN 5.1 (H) 12/23/2020   CALCIUM 10.4 (H) 12/23/2020   EGFR 80 12/23/2020   Lab Results  Component Value Date   CHOL 242 (H) 12/23/2020   Lab Results  Component Value Date   HDL 33 (L) 12/23/2020   Lab Results  Component Value Date   LDLCALC 156 (H) 12/23/2020   Lab Results  Component Value Date   TRIG 285 (H) 12/23/2020   Lab Results  Component Value Date   CHOLHDL 7.3 (H)  12/23/2020   Lab Results  Component Value Date   HGBA1C 11.7 (H) 12/23/2020

## 2021-04-28 LAB — CMP14+EGFR
ALT: 30 IU/L (ref 0–44)
AST: 19 IU/L (ref 0–40)
Albumin/Globulin Ratio: 1.9 (ref 1.2–2.2)
Albumin: 5.1 g/dL — ABNORMAL HIGH (ref 3.8–4.9)
Alkaline Phosphatase: 75 IU/L (ref 44–121)
BUN/Creatinine Ratio: 14 (ref 9–20)
BUN: 15 mg/dL (ref 6–24)
Bilirubin Total: 1 mg/dL (ref 0.0–1.2)
CO2: 22 mmol/L (ref 20–29)
Calcium: 10.4 mg/dL — ABNORMAL HIGH (ref 8.7–10.2)
Chloride: 98 mmol/L (ref 96–106)
Creatinine, Ser: 1.06 mg/dL (ref 0.76–1.27)
Globulin, Total: 2.7 g/dL (ref 1.5–4.5)
Glucose: 192 mg/dL — ABNORMAL HIGH (ref 65–99)
Potassium: 4.2 mmol/L (ref 3.5–5.2)
Sodium: 138 mmol/L (ref 134–144)
Total Protein: 7.8 g/dL (ref 6.0–8.5)
eGFR: 84 mL/min/{1.73_m2} (ref >59)

## 2021-04-28 LAB — CBC WITH DIFFERENTIAL/PLATELET
Basophils Absolute: 0.1 10*3/uL (ref 0.0–0.2)
Basos: 1 %
EOS (ABSOLUTE): 0.3 10*3/uL (ref 0.0–0.4)
Eos: 3 %
Hematocrit: 46.9 % (ref 37.5–51.0)
Hemoglobin: 16.2 g/dL (ref 13.0–17.7)
Immature Grans (Abs): 0 10*3/uL (ref 0.0–0.1)
Immature Granulocytes: 0 %
Lymphocytes Absolute: 2.5 10*3/uL (ref 0.7–3.1)
Lymphs: 25 %
MCH: 29.4 pg (ref 26.6–33.0)
MCHC: 34.5 g/dL (ref 31.5–35.7)
MCV: 85 fL (ref 79–97)
Monocytes Absolute: 0.7 10*3/uL (ref 0.1–0.9)
Monocytes: 7 %
Neutrophils Absolute: 6.3 10*3/uL (ref 1.4–7.0)
Neutrophils: 64 %
Platelets: 282 10*3/uL (ref 150–450)
RBC: 5.51 x10E6/uL (ref 4.14–5.80)
RDW: 11.7 % (ref 11.6–15.4)
WBC: 9.8 10*3/uL (ref 3.4–10.8)

## 2021-04-28 LAB — LIPID PANEL
Chol/HDL Ratio: 4.1 ratio (ref 0.0–5.0)
Cholesterol, Total: 153 mg/dL (ref 100–199)
HDL: 37 mg/dL — ABNORMAL LOW (ref >39)
LDL Chol Calc (NIH): 91 mg/dL (ref 0–99)
Triglycerides: 143 mg/dL (ref 0–149)
VLDL Cholesterol Cal: 25 mg/dL (ref 5–40)

## 2021-05-31 ENCOUNTER — Other Ambulatory Visit: Payer: Self-pay | Admitting: Family Medicine

## 2021-05-31 DIAGNOSIS — I10 Essential (primary) hypertension: Secondary | ICD-10-CM

## 2021-05-31 DIAGNOSIS — E1141 Type 2 diabetes mellitus with diabetic mononeuropathy: Secondary | ICD-10-CM

## 2021-06-04 ENCOUNTER — Other Ambulatory Visit: Payer: Self-pay | Admitting: Family Medicine

## 2021-06-04 DIAGNOSIS — E782 Mixed hyperlipidemia: Secondary | ICD-10-CM

## 2021-06-14 ENCOUNTER — Other Ambulatory Visit: Payer: Self-pay

## 2021-06-14 ENCOUNTER — Encounter (HOSPITAL_COMMUNITY)
Admission: EM | Disposition: A | Discharge status: Home / Self Care | Payer: Self-pay | Source: Home / Self Care | Attending: Cardiovascular Disease

## 2021-06-14 ENCOUNTER — Emergency Department (HOSPITAL_COMMUNITY): Payer: 59

## 2021-06-14 ENCOUNTER — Inpatient Hospital Stay (HOSPITAL_COMMUNITY): Payer: 59

## 2021-06-14 ENCOUNTER — Encounter (HOSPITAL_COMMUNITY): Payer: Self-pay | Admitting: *Deleted

## 2021-06-14 ENCOUNTER — Inpatient Hospital Stay (HOSPITAL_COMMUNITY)
Admission: EM | Admit: 2021-06-14 | Discharge: 2021-06-23 | DRG: 234 | Disposition: A | Discharge status: Home / Self Care | Payer: 59 | Attending: Thoracic Surgery (Cardiothoracic Vascular Surgery) | Admitting: Thoracic Surgery (Cardiothoracic Vascular Surgery)

## 2021-06-14 DIAGNOSIS — I2510 Atherosclerotic heart disease of native coronary artery without angina pectoris: Secondary | ICD-10-CM | POA: Diagnosis present

## 2021-06-14 DIAGNOSIS — D72829 Elevated white blood cell count, unspecified: Secondary | ICD-10-CM | POA: Diagnosis not present

## 2021-06-14 DIAGNOSIS — E1141 Type 2 diabetes mellitus with diabetic mononeuropathy: Secondary | ICD-10-CM | POA: Diagnosis present

## 2021-06-14 DIAGNOSIS — N400 Enlarged prostate without lower urinary tract symptoms: Secondary | ICD-10-CM | POA: Diagnosis present

## 2021-06-14 DIAGNOSIS — R911 Solitary pulmonary nodule: Secondary | ICD-10-CM | POA: Diagnosis present

## 2021-06-14 DIAGNOSIS — D62 Acute posthemorrhagic anemia: Secondary | ICD-10-CM | POA: Diagnosis not present

## 2021-06-14 DIAGNOSIS — Z8249 Family history of ischemic heart disease and other diseases of the circulatory system: Secondary | ICD-10-CM | POA: Diagnosis not present

## 2021-06-14 DIAGNOSIS — Z20822 Contact with and (suspected) exposure to covid-19: Secondary | ICD-10-CM | POA: Diagnosis present

## 2021-06-14 DIAGNOSIS — I252 Old myocardial infarction: Secondary | ICD-10-CM | POA: Diagnosis not present

## 2021-06-14 DIAGNOSIS — I214 Non-ST elevation (NSTEMI) myocardial infarction: Principal | ICD-10-CM | POA: Diagnosis present

## 2021-06-14 DIAGNOSIS — E119 Type 2 diabetes mellitus without complications: Secondary | ICD-10-CM | POA: Diagnosis present

## 2021-06-14 DIAGNOSIS — E782 Mixed hyperlipidemia: Secondary | ICD-10-CM | POA: Diagnosis present

## 2021-06-14 DIAGNOSIS — E8770 Fluid overload, unspecified: Secondary | ICD-10-CM | POA: Diagnosis not present

## 2021-06-14 DIAGNOSIS — Z79899 Other long term (current) drug therapy: Secondary | ICD-10-CM

## 2021-06-14 DIAGNOSIS — R42 Dizziness and giddiness: Secondary | ICD-10-CM | POA: Diagnosis present

## 2021-06-14 DIAGNOSIS — Z09 Encounter for follow-up examination after completed treatment for conditions other than malignant neoplasm: Secondary | ICD-10-CM

## 2021-06-14 DIAGNOSIS — I152 Hypertension secondary to endocrine disorders: Secondary | ICD-10-CM | POA: Diagnosis present

## 2021-06-14 DIAGNOSIS — Z888 Allergy status to other drugs, medicaments and biological substances status: Secondary | ICD-10-CM

## 2021-06-14 DIAGNOSIS — I10 Essential (primary) hypertension: Secondary | ICD-10-CM | POA: Diagnosis present

## 2021-06-14 DIAGNOSIS — K5900 Constipation, unspecified: Secondary | ICD-10-CM | POA: Diagnosis not present

## 2021-06-14 DIAGNOSIS — I25110 Atherosclerotic heart disease of native coronary artery with unstable angina pectoris: Secondary | ICD-10-CM | POA: Diagnosis not present

## 2021-06-14 DIAGNOSIS — Z7984 Long term (current) use of oral hypoglycemic drugs: Secondary | ICD-10-CM

## 2021-06-14 DIAGNOSIS — Z7982 Long term (current) use of aspirin: Secondary | ICD-10-CM | POA: Diagnosis not present

## 2021-06-14 DIAGNOSIS — Z951 Presence of aortocoronary bypass graft: Secondary | ICD-10-CM

## 2021-06-14 DIAGNOSIS — J939 Pneumothorax, unspecified: Secondary | ICD-10-CM

## 2021-06-14 DIAGNOSIS — E1169 Type 2 diabetes mellitus with other specified complication: Secondary | ICD-10-CM | POA: Diagnosis present

## 2021-06-14 DIAGNOSIS — Z01810 Encounter for preprocedural cardiovascular examination: Secondary | ICD-10-CM | POA: Diagnosis not present

## 2021-06-14 HISTORY — PX: LEFT HEART CATH AND CORONARY ANGIOGRAPHY: CATH118249

## 2021-06-14 LAB — BASIC METABOLIC PANEL
Anion gap: 9 (ref 5–15)
BUN: 17 mg/dL (ref 6–20)
CO2: 24 mmol/L (ref 22–32)
Calcium: 9.3 mg/dL (ref 8.9–10.3)
Chloride: 104 mmol/L (ref 98–111)
Creatinine, Ser: 1.11 mg/dL (ref 0.61–1.24)
GFR, Estimated: >60 mL/min (ref >60)
Glucose, Bld: 213 mg/dL — ABNORMAL HIGH (ref 70–99)
Potassium: 3.8 mmol/L (ref 3.5–5.1)
Sodium: 137 mmol/L (ref 135–145)

## 2021-06-14 LAB — CBC
HCT: 48.5 % (ref 39.0–52.0)
Hemoglobin: 16.6 g/dL (ref 13.0–17.0)
MCH: 29.8 pg (ref 26.0–34.0)
MCHC: 34.2 g/dL (ref 30.0–36.0)
MCV: 87.1 fL (ref 80.0–100.0)
Platelets: 267 10*3/uL (ref 150–400)
RBC: 5.57 MIL/uL (ref 4.22–5.81)
RDW: 12.7 % (ref 11.5–15.5)
WBC: 9.4 10*3/uL (ref 4.0–10.5)
nRBC: 0 % (ref 0.0–0.2)

## 2021-06-14 LAB — TROPONIN I (HIGH SENSITIVITY)
Troponin I (High Sensitivity): 278 ng/L (ref <18)
Troponin I (High Sensitivity): 275 ng/L (ref <18)
Troponin I (High Sensitivity): 285 ng/L (ref <18)
Troponin I (High Sensitivity): 265 ng/L (ref <18)

## 2021-06-14 LAB — RESP PANEL BY RT-PCR (FLU A&B, COVID) ARPGX2
Influenza A by PCR: NEGATIVE
Influenza B by PCR: NEGATIVE
SARS Coronavirus 2 by RT PCR: NEGATIVE

## 2021-06-14 LAB — GLUCOSE, CAPILLARY
Glucose-Capillary: 75 mg/dL (ref 70–99)
Glucose-Capillary: 120 mg/dL — ABNORMAL HIGH (ref 70–99)

## 2021-06-14 LAB — D-DIMER, QUANTITATIVE: D-Dimer, Quant: <0.27 ug/mL-FEU (ref 0.00–0.50)

## 2021-06-14 SURGERY — LEFT HEART CATH AND CORONARY ANGIOGRAPHY
Anesthesia: LOCAL

## 2021-06-14 MED ORDER — ASPIRIN EC 81 MG PO TBEC
81.0000 mg | DELAYED_RELEASE_TABLET | Freq: Every day | ORAL | Status: DC
  Administered 2021-06-15 – 2021-06-18 (×4): 81 mg via ORAL
  Filled 2021-06-14 (×4): qty 1

## 2021-06-14 MED ORDER — SODIUM CHLORIDE 0.9% FLUSH
3.0000 mL | Freq: Two times a day (BID) | INTRAVENOUS | Status: DC
Start: 2021-06-14 — End: 2021-06-14

## 2021-06-14 MED ORDER — LOSARTAN POTASSIUM 50 MG PO TABS
100.0000 mg | ORAL_TABLET | Freq: Every day | ORAL | Status: DC
  Administered 2021-06-15 – 2021-06-18 (×4): 100 mg via ORAL
  Filled 2021-06-14 (×4): qty 2

## 2021-06-14 MED ORDER — FENTANYL CITRATE (PF) 100 MCG/2ML IJ SOLN
INTRAMUSCULAR | Status: DC | PRN
  Administered 2021-06-14: 25 ug via INTRAVENOUS

## 2021-06-14 MED ORDER — ACETAMINOPHEN 325 MG PO TABS: 650.0000 mg | ORAL_TABLET | ORAL | Status: DC | PRN

## 2021-06-14 MED ORDER — SODIUM CHLORIDE 0.9% FLUSH
3.0000 mL | Freq: Two times a day (BID) | INTRAVENOUS | Status: DC
  Administered 2021-06-16 – 2021-06-18 (×3): 3 mL via INTRAVENOUS

## 2021-06-14 MED ORDER — SODIUM CHLORIDE 0.9 % WEIGHT BASED INFUSION
1.0000 mL/kg/h | INTRAVENOUS | Status: DC
  Administered 2021-06-14: 1 mL/kg/h via INTRAVENOUS

## 2021-06-14 MED ORDER — HEPARIN SODIUM (PORCINE) 1000 UNIT/ML IJ SOLN
INTRAMUSCULAR | Status: DC | PRN
  Administered 2021-06-14: 4000 [IU] via INTRAVENOUS

## 2021-06-14 MED ORDER — ONDANSETRON HCL 4 MG/2ML IJ SOLN
4.0000 mg | Freq: Four times a day (QID) | INTRAMUSCULAR | Status: DC | PRN
Start: 2021-06-14 — End: 2021-06-14

## 2021-06-14 MED ORDER — ASPIRIN 81 MG PO CHEW
324.0000 mg | CHEWABLE_TABLET | Freq: Once | ORAL | Status: AC
  Administered 2021-06-14: 324 mg via ORAL
  Filled 2021-06-14: qty 4

## 2021-06-14 MED ORDER — SODIUM CHLORIDE 0.9% FLUSH: 3.0000 mL | INTRAVENOUS | Status: DC | PRN

## 2021-06-14 MED ORDER — SODIUM CHLORIDE 0.9 % WEIGHT BASED INFUSION: 1.0000 mL/kg/h | INTRAVENOUS | Status: AC

## 2021-06-14 MED ORDER — HEPARIN (PORCINE) IN NACL 1000-0.9 UT/500ML-% IV SOLN
INTRAVENOUS | Status: DC | PRN
  Administered 2021-06-14 (×2): 500 mL

## 2021-06-14 MED ORDER — METOPROLOL TARTRATE 25 MG PO TABS
25.0000 mg | ORAL_TABLET | Freq: Two times a day (BID) | ORAL | Status: DC
  Administered 2021-06-14 – 2021-06-18 (×10): 25 mg via ORAL
  Filled 2021-06-14 (×10): qty 1

## 2021-06-14 MED ORDER — DAPAGLIFLOZIN PROPANEDIOL 10 MG PO TABS
10.0000 mg | ORAL_TABLET | Freq: Every day | ORAL | Status: DC
  Administered 2021-06-14 – 2021-06-18 (×5): 10 mg via ORAL
  Filled 2021-06-14 (×7): qty 1

## 2021-06-14 MED ORDER — SODIUM CHLORIDE 0.9 % IV SOLN: 250.0000 mL | INTRAVENOUS | Status: DC | PRN

## 2021-06-14 MED ORDER — AMLODIPINE BESYLATE 10 MG PO TABS
10.0000 mg | ORAL_TABLET | Freq: Every day | ORAL | Status: DC
  Administered 2021-06-15 – 2021-06-18 (×4): 10 mg via ORAL
  Filled 2021-06-14 (×4): qty 1

## 2021-06-14 MED ORDER — HEPARIN (PORCINE) IN NACL 1000-0.9 UT/500ML-% IV SOLN
INTRAVENOUS | Status: AC
  Filled 2021-06-14: qty 1000

## 2021-06-14 MED ORDER — SODIUM CHLORIDE 0.9% FLUSH: 3.0000 mL | Freq: Two times a day (BID) | INTRAVENOUS | Status: DC

## 2021-06-14 MED ORDER — NITROGLYCERIN 0.4 MG SL SUBL
0.4000 mg | SUBLINGUAL_TABLET | SUBLINGUAL | Status: DC | PRN
  Administered 2021-06-14 – 2021-06-15 (×5): 0.4 mg via SUBLINGUAL
  Filled 2021-06-14 (×3): qty 1

## 2021-06-14 MED ORDER — IOHEXOL 350 MG/ML SOLN
75.0000 mL | Freq: Once | INTRAVENOUS | Status: AC | PRN
  Administered 2021-06-14: 75 mL via INTRAVENOUS

## 2021-06-14 MED ORDER — VERAPAMIL HCL 2.5 MG/ML IV SOLN
INTRAVENOUS | Status: AC
  Filled 2021-06-14: qty 2

## 2021-06-14 MED ORDER — ONDANSETRON HCL 4 MG/2ML IJ SOLN
4.0000 mg | Freq: Once | INTRAMUSCULAR | Status: AC
  Administered 2021-06-14: 4 mg via INTRAVENOUS
  Filled 2021-06-14: qty 2

## 2021-06-14 MED ORDER — LIDOCAINE HCL (PF) 1 % IJ SOLN
INTRAMUSCULAR | Status: DC | PRN
  Administered 2021-06-14: 2 mL

## 2021-06-14 MED ORDER — HYDRALAZINE HCL 20 MG/ML IJ SOLN: 10.0000 mg | INTRAMUSCULAR | Status: AC | PRN

## 2021-06-14 MED ORDER — ONDANSETRON HCL 4 MG/2ML IJ SOLN: 4.0000 mg | Freq: Four times a day (QID) | INTRAMUSCULAR | Status: DC | PRN

## 2021-06-14 MED ORDER — ASPIRIN 81 MG PO CHEW: 81.0000 mg | CHEWABLE_TABLET | ORAL | Status: AC

## 2021-06-14 MED ORDER — HEPARIN BOLUS VIA INFUSION
4000.0000 [IU] | Freq: Once | INTRAVENOUS | Status: AC
  Administered 2021-06-14: 4000 [IU] via INTRAVENOUS

## 2021-06-14 MED ORDER — SODIUM CHLORIDE 0.9 % WEIGHT BASED INFUSION
3.0000 mL/kg/h | INTRAVENOUS | Status: AC
  Administered 2021-06-14: 3 mL/kg/h via INTRAVENOUS

## 2021-06-14 MED ORDER — HEPARIN (PORCINE) 25000 UT/250ML-% IV SOLN
1200.0000 [IU]/h | INTRAVENOUS | Status: DC
  Administered 2021-06-14 – 2021-06-15 (×2): 1000 [IU]/h via INTRAVENOUS
  Administered 2021-06-16 – 2021-06-18 (×4): 1200 [IU]/h via INTRAVENOUS
  Filled 2021-06-14 (×4): qty 250

## 2021-06-14 MED ORDER — GLIMEPIRIDE 4 MG PO TABS
4.0000 mg | ORAL_TABLET | Freq: Every day | ORAL | Status: DC
  Administered 2021-06-15 – 2021-06-18 (×4): 4 mg via ORAL
  Filled 2021-06-14 (×6): qty 1

## 2021-06-14 MED ORDER — ACETAMINOPHEN 325 MG PO TABS
650.0000 mg | ORAL_TABLET | Freq: Once | ORAL | Status: AC
  Administered 2021-06-14: 650 mg via ORAL
  Filled 2021-06-14: qty 2

## 2021-06-14 MED ORDER — ATORVASTATIN CALCIUM 80 MG PO TABS
80.0000 mg | ORAL_TABLET | Freq: Every day | ORAL | Status: DC
  Administered 2021-06-14: 80 mg via ORAL
  Filled 2021-06-14: qty 2

## 2021-06-14 MED ORDER — ACETAMINOPHEN 325 MG PO TABS
650.0000 mg | ORAL_TABLET | ORAL | Status: DC | PRN
Start: 2021-06-14 — End: 2021-06-19

## 2021-06-14 MED ORDER — INSULIN ASPART 100 UNIT/ML IJ SOLN
0.0000 [IU] | Freq: Three times a day (TID) | INTRAMUSCULAR | Status: DC
  Administered 2021-06-17: 3 [IU] via SUBCUTANEOUS

## 2021-06-14 MED ORDER — FENTANYL CITRATE (PF) 100 MCG/2ML IJ SOLN
INTRAMUSCULAR | Status: AC
  Filled 2021-06-14: qty 2

## 2021-06-14 MED ORDER — LABETALOL HCL 5 MG/ML IV SOLN: 10.0000 mg | INTRAVENOUS | Status: AC | PRN

## 2021-06-14 MED ORDER — ATORVASTATIN CALCIUM 80 MG PO TABS
80.0000 mg | ORAL_TABLET | Freq: Every day | ORAL | Status: DC
  Administered 2021-06-15 – 2021-06-23 (×8): 80 mg via ORAL
  Filled 2021-06-14 (×8): qty 1

## 2021-06-14 MED ORDER — HEPARIN SODIUM (PORCINE) 1000 UNIT/ML IJ SOLN
INTRAMUSCULAR | Status: AC
  Filled 2021-06-14: qty 1

## 2021-06-14 MED ORDER — MIDAZOLAM HCL 2 MG/2ML IJ SOLN
INTRAMUSCULAR | Status: DC | PRN
  Administered 2021-06-14: 2 mg via INTRAVENOUS

## 2021-06-14 MED ORDER — MIDAZOLAM HCL 2 MG/2ML IJ SOLN
INTRAMUSCULAR | Status: AC
  Filled 2021-06-14: qty 2

## 2021-06-14 MED ORDER — VERAPAMIL HCL 2.5 MG/ML IV SOLN
INTRAVENOUS | Status: DC | PRN
  Administered 2021-06-14: 10 mL via INTRA_ARTERIAL

## 2021-06-14 MED ORDER — SODIUM CHLORIDE 0.9 % IV SOLN
250.0000 mL | INTRAVENOUS | Status: DC | PRN
Start: 2021-06-14 — End: 2021-06-14

## 2021-06-14 MED ORDER — HEPARIN (PORCINE) 25000 UT/250ML-% IV SOLN
1000.0000 [IU]/h | INTRAVENOUS | Status: DC
  Administered 2021-06-14: 1000 [IU]/h via INTRAVENOUS
  Filled 2021-06-14: qty 250

## 2021-06-14 MED ORDER — LIDOCAINE HCL (PF) 1 % IJ SOLN
INTRAMUSCULAR | Status: AC
  Filled 2021-06-14: qty 30

## 2021-06-14 MED ORDER — IOHEXOL 350 MG/ML SOLN
INTRAVENOUS | Status: DC | PRN
  Administered 2021-06-14: 85 mL

## 2021-06-14 SURGICAL SUPPLY — 11 items
CATH 5FR JL3.5 JR4 ANG PIG MP (CATHETERS) ×2 IMPLANT
CATH INFINITI 5 FR JR5 (CATHETERS) ×2 IMPLANT
DEVICE RAD COMP TR BAND LRG (VASCULAR PRODUCTS) ×2 IMPLANT
GLIDESHEATH SLEND SS 6F .021 (SHEATH) ×2 IMPLANT
GUIDEWIRE INQWIRE 1.5J.035X260 (WIRE) ×1 IMPLANT
INQWIRE 1.5J .035X260CM (WIRE) ×2
KIT HEART LEFT (KITS) ×2 IMPLANT
PACK CARDIAC CATHETERIZATION (CUSTOM PROCEDURE TRAY) ×2 IMPLANT
SYR MEDRAD MARK 7 150ML (SYRINGE) ×2 IMPLANT
TRANSDUCER W/STOPCOCK (MISCELLANEOUS) ×2 IMPLANT
TUBING CIL FLEX 10 FLL-RA (TUBING) ×2 IMPLANT

## 2021-06-14 NOTE — Progress Notes (Signed)
ANTICOAGULATION CONSULT NOTE - Initial Consult  Pharmacy Consult for heparin Indication: chest pain/ACS  Allergies  Allergen Reactions   Ace Inhibitors Swelling and Other (See Comments)    Tongue swelling and cough.     Patient Measurements: Height: 5\' 7"  (170.2 cm) Weight: 78.9 kg (174 lb) IBW/kg (Calculated) : 66.1 HEPARIN DW (KG): 78.9   Vital Signs: Temp: 98.9 F (37.2 C) (10/12 0945) Temp Source: Oral (10/12 0945) BP: 136/93 (10/12 1100) Pulse Rate: 97 (10/12 1100)  Labs: Recent Labs    06/14/21 0954  HGB 16.6  HCT 48.5  PLT 267  CREATININE 1.11  TROPONINIHS 278*    Estimated Creatinine Clearance: 72 mL/min (by C-G formula based on SCr of 1.11 mg/dL).   Medical History: Past Medical History:  Diagnosis Date   Diabetes mellitus without complication (HCC)    Hyperlipidemia    Hypertension     Medications:  See med rec  Assessment: Patient presented with chest pain that radiates into left arm and jaw. Not on any oral anticoagulation. Pharmacy asked to start heparin  Goal of Therapy:  Heparin level 0.3-0.7 units/ml Monitor platelets by anticoagulation protocol: Yes   Plan:  Give 4000 units bolus x 1 Start heparin infusion at 1000 units/hr Check anti-Xa level in ~6 hours and daily while on heparin Continue to monitor H&H and platelets  08/14/21, BS Elder Cyphers, BCPS Clinical Pharmacist Pager [phone] 06/14/2021,11:05 AM

## 2021-06-14 NOTE — ED Triage Notes (Signed)
Pt c/o left sided chest pain that radiates into left arm that started Friday. Pt reports the pain started going up into his left jaw this morning. Pt also c/o SOB, nausea, dizziness and weakness.

## 2021-06-14 NOTE — ED Notes (Signed)
RN was in the room when pt reported he was starting to feel nauseated. Emesis bag given and then pt c/o feeling sweaty and "not right". Pt laid flat and new EKG done. Upon getting EKG, pt was noted to be very diaphoretic. BP 97/71, HR 65. Dr. Charm Barges given new EKG and updated on pt's symptoms.

## 2021-06-14 NOTE — Plan of Care (Signed)

## 2021-06-14 NOTE — ED Notes (Signed)
Pt given Nitro SL for chest pain. Initially rated his pain at a 7. Five minutes afterwards pain was rated at a 1. Pt stated, "it helped my pain significantly".

## 2021-06-14 NOTE — Progress Notes (Signed)
ANTICOAGULATION CONSULT NOTE  Pharmacy Consult for heparin Indication: chest pain/ACS  Allergies  Allergen Reactions   Ace Inhibitors Swelling and Other (See Comments)    Tongue swelling and cough.     Patient Measurements: Height: 5\' 7"  (170.2 cm) Weight: 78.9 kg (174 lb) IBW/kg (Calculated) : 66.1 HEPARIN DW (KG): 78.9   Vital Signs: Temp: 97.6 F (36.4 C) (10/12 1345) Temp Source: Oral (10/12 1345) BP: 116/68 (10/12 1600) Pulse Rate: 66 (10/12 1600)  Labs: Recent Labs    06/14/21 0954 06/14/21 1139  HGB 16.6  --   HCT 48.5  --   PLT 267  --   CREATININE 1.11  --   TROPONINIHS 278* 275*     Estimated Creatinine Clearance: 72 mL/min (by C-G formula based on SCr of 1.11 mg/dL).  Assessment: Patient presented with chest pain that radiates into left arm and jaw, and pharmacy asked to dose heparin.  Cath reveals severe multivessel CAD and TCTS consulted for possible CABG vs targeted PCI.  In the meantime, IV heparin to be resumed 4 hours post TR band removal.   Per discussion with RN, patient had a hematoma soon after he came to [facility].  Hematoma resolving and TR band was removed at 1900.  Goal of Therapy:  Heparin level 0.3-0.7 units/ml Monitor platelets by anticoagulation protocol: Yes   Plan:  At 2300, restart heparin infusion at 1000 units/hr Check 6 hr heparin level Daily heparin level and CBC Monitor for hematoma stability, s/sx of bleeding  Dalia Jollie D. 10-10-1980, PharmD, BCPS, BCCCP 06/14/2021, 9:08 PM

## 2021-06-14 NOTE — H&P (Signed)
Cardiology Admission History and Physical:  Patient ID: Matthew Mann MRN: [id number] DOB: 07/17/1966  Admit date: 06/14/2021  Primary Care Provider: Gwenlyn Fudge, FNP Primary Cardiologist: None  Primary Electrophysiologist:  None   Chief Complaint:  Chest pain  Patient Profile:  Matthew Mann is a 55 y.o. male with diabetes, hypertension, hyperlipidemia who was admitted on 06/14/2021 for non-STEMI.  History of Present Illness:  [name] presents with intermittent chest pressure over the last 4 days.  He reports last Thursday he was carrying his grandson when he noticed pressure in his left shoulder.  He reports he put his grandson down and symptoms resolved.  He is continue to have intermittent exertional chest pressure symptoms.  He reports a dull pain in the center of his chest as well as into his left arm.  It occurs with exertion and is relieved by rest.  He reports symptoms have not fully resolved.  He reports nausea with his symptoms.  No significant shortness of breath.  He reports he went to [facility] with his wife on Saturday and symptoms continued.  She made him come back early as a plan to stay to Friday.  Medical history significant for diabetes and hypertension.  He has never had a heart attack or stroke.  He reports his mother had a heart attack at age 47.  She died suddenly.  He is diabetic and inform his A1c is 8.0.  We do not know his cholesterol profile.  He is a Education officer, environmental.  He has children and grandchildren.  He denies tobacco abuse, alcohol or drugs.  On arrival to the ER vitals were stable.  Labs show creatinine 1.11.  Hemoglobin 16.6.  Initial troponin 278.  EKG demonstrates sinus rhythm heart rate 84, nonspecific ST-T changes were noted.  No prior tracing is available.  He informs me that his chest pain is 5 out of 10 currently.  It does improve with nitroglycerin.  Heart Pathway Score:   HEAR Score: 6   Past Medical History: Past Medical History:  Diagnosis  Date   Diabetes mellitus without complication (HCC)    Hyperlipidemia    Hypertension     Past Surgical History: History reviewed. No pertinent surgical history.   Medications Prior to Admission: Prior to Admission medications   Medication Sig Start Date End Date Taking? Authorizing Provider  amLODipine (NORVASC) 10 MG tablet Take 1 tablet (10 mg total) by mouth daily. 12/23/20  Yes Deliah Boston F, FNP  aspirin 81 MG EC tablet Take 1 tablet by mouth daily.   Yes [provider]  atorvastatin (LIPITOR) 20 MG tablet Take 1 tablet (20 mg total) by mouth daily. 02/08/21  Yes Dettinger, Elige Radon, MD  dapagliflozin propanediol (FARXIGA) 10 MG TABS tablet Take 1 tablet (10 mg total) by mouth daily. 05/31/21  Yes Gwenlyn Fudge, FNP  glimepiride (AMARYL) 4 MG tablet Take 1 tablet (4 mg total) by mouth in the morning and at bedtime. 02/21/21  Yes Deliah Boston F, FNP  hydrochlorothiazide (HYDRODIURIL) 25 MG tablet Take 1 tablet (25 mg total) by mouth daily. 12/23/20  Yes Deliah Boston F, FNP  losartan (COZAAR) 100 MG tablet TAKE 1 TABLET BY MOUTH EVERY DAY 05/31/21  Yes Deliah Boston F, FNP  metFORMIN (GLUCOPHAGE-XR) 500 MG 24 hr tablet Take 1 tablet (500 mg total) by mouth 2 (two) times daily. Patient taking differently: Take 500 mg by mouth daily. 12/23/20  Yes Deliah Boston F, FNP  predniSONE (STERAPRED UNI-PAK 21 TAB)  10 MG (21) TBPK tablet As directed x 6 days Patient not taking: No sig reported 04/27/21   Gwenlyn Fudge, FNP     Allergies:    Allergies  Allergen Reactions   Ace Inhibitors Swelling and Other (See Comments)    Tongue swelling and cough.     Social History:   Social History   Socioeconomic History   Marital status: Married    Spouse name: Not on file   Number of children: Not on file   Years of education: Not on file   Highest education level: Not on file  Occupational History   Not on file  Tobacco Use   Smoking status: Never   Smokeless tobacco:  Never  Vaping Use   Vaping Use: Never used  Substance and Sexual Activity   Alcohol use: Never   Drug use: Never   Sexual activity: Yes  Other Topics Concern   Not on file  Social History Narrative   Not on file   Social Determinants of Health   Financial Resource Strain: Not on file  Food Insecurity: Not on file  Transportation Needs: Not on file  Physical Activity: Not on file  Stress: Not on file  Social Connections: Not on file  Intimate Partner Violence: Not on file     Family History:   The patient's family history includes Heart attack in his maternal grandfather; Heart disease in his sister; Hypertension in his mother.    ROS:  All other ROS reviewed and negative. Pertinent positives noted in the HPI.     Physical Exam/Data:   Vitals:   06/14/21 0946 06/14/21 1000 06/14/21 1030 06/14/21 1100  BP:  (!) 140/92 125/82 (!) 136/93  Pulse:  90 81 97  Resp:  16 12 11   Temp:      TempSrc:      SpO2:  100% 97% 100%  Weight: 78.9 kg     Height: 5\' 7"  (1.702 m)      No intake or output data in the 24 hours ending 06/14/21 1114  Last 3 Weights 06/14/2021 04/27/2021 02/21/2021  Weight (lbs) 174 lb 172 lb 174 lb 6.4 oz  Weight (kg) 78.926 kg 78.019 kg 79.107 kg    Body mass index is 27.25 kg/m.   General: Well nourished, well developed, in no acute distress Head: Atraumatic, normal size  Eyes: PEERLA, EOMI  Neck: Supple, no JVD Endocrine: No thryomegaly Cardiac: Normal S1, S2; RRR; no murmurs, rubs, or gallops Lungs: Clear to auscultation bilaterally, no wheezing, rhonchi or rales  Abd: Soft, nontender, no hepatomegaly  Ext: No edema, pulses 2+ Musculoskeletal: No deformities, BUE and BLE strength normal and equal Skin: Warm and dry, no rashes   Neuro: Alert and oriented to person, place, time, and situation, CNII-XII grossly intact, no focal deficits  Psych: Normal mood and affect   EKG:  The ECG that was done was personally reviewed and demonstrates ormal sinus  rhythm heart rate 84, nonspecific ST-T changes   Relevant CV Studies: Echo pending  Laboratory Data: High Sensitivity Troponin:   Recent Labs  Lab 06/14/21 0954  TROPONINIHS 278*      Cardiac EnzymesNo results for input(s): TROPONINI in the last 168 hours. No results for input(s): TROPIPOC in the last 168 hours.  Chemistry Recent Labs  Lab 06/14/21 0954  NA 137  K 3.8  CL 104  CO2 24  GLUCOSE 213*  BUN 17  CREATININE 1.11  CALCIUM 9.3  GFRNONAA >60  ANIONGAP 9  No results for input(s): PROT, ALBUMIN, AST, ALT, ALKPHOS, BILITOT in the last 168 hours. Hematology Recent Labs  Lab 06/14/21 0954  WBC 9.4  RBC 5.57  HGB 16.6  HCT 48.5  MCV 87.1  MCH 29.8  MCHC 34.2  RDW 12.7  PLT 267   BNPNo results for input(s): BNP, PROBNP in the last 168 hours.  DDimer  Recent Labs  Lab 06/14/21 0954  DDIMER <0.27    Radiology/Studies:  [facility] Chest Port 1 View  Result Date: 06/14/2021 CLINICAL DATA:  Left-sided chest pain EXAM: PORTABLE CHEST 1 VIEW COMPARISON:  None. FINDINGS: The heart size and mediastinal contours are within normal limits. No focal airspace consolidation, pleural effusion, or pneumothorax. The visualized skeletal structures are unremarkable. IMPRESSION: No active disease. Electronically Signed   By: Duanne Guess D.O.   On: 06/14/2021 10:34    Assessment and Plan:   #NSTEMI -He presents with exertional chest pain symptoms that have progressed to symptoms at rest.  Chest pain improved with nitroglycerin.  Initial troponin is consistent with non-STEMI.  CVD risk factors include diabetes and hypertension.  EKG shows no ST elevation.  He does have nonspecific ST-T changes. -He will be admitted directly to our service.  We will plan for transfer today to [facility].  I discussed left heart catheterization with him.  Risk and benefits were explained and he is willing to proceed. -We will continue with heparin drip. -Sublingual nitro as needed for pain.  She  did continue to have pain we will start nitroglycerin drip. -He is on Lipitor and have increased this to 80 mg daily. -He has been given aspirin 325. 81 mg daily tomorrow.  -I have started metoprolol tartrate 25 mg twice daily. -He will need TSH, A1c and lipid profile tomorrow. -We will also set him up for an echo. -He currently is electrically stable.  No signs of arrhythmia.  Chest pain symptoms are improving with nitro.  He is hemodynamically stable.  Given symptoms that have occurred since Thursday we will proceed with left heart catheterization today.  Shared Decision Making/Informed Consent The risks [stroke (1 in 1000), death (1 in 1000), kidney failure [usually temporary] (1 in 500), bleeding (1 in 200), allergic reaction [possibly serious] (1 in 200)], benefits (diagnostic support and management of coronary artery disease) and alternatives of a cardiac catheterization were discussed in detail with Mr. Pfiffner and he is willing to proceed.  #DM -Hold oral hypoglycemic agents.  Sliding scale insulin while in house. -He will need to hold his metformin several days after cardiac cath. -Check A1c.  #Hypertension -Add metoprolol as above. -Add back home Norvasc and HCTZ as you are able.  Also on home losartan.  #FEN -Precath fluids -DVT Ppx: Heparin drip -Diet: N.p.o.  -Code: Full  Severity of Illness: The appropriate patient status for this patient is INPATIENT. Inpatient status is judged to be reasonable and necessary in order to provide the required intensity of service to ensure the patient's safety. The patient's presenting symptoms, physical exam findings, and initial radiographic and laboratory data in the context of their chronic comorbidities is felt to place them at high risk for further clinical deterioration. Furthermore, it is not anticipated that the patient will be medically stable for discharge from the hospital within 2 midnights of admission. The following factors support  the patient status of inpatient.   " The patient's presenting symptoms include chest pain. " The worrisome physical exam findings include chest pain. " The initial radiographic and  laboratory data are worrisome because of elevated troponin, abnormal EKG. " The chronic co-morbidities include diabetes, hypertension.   * I certify that at the point of admission it is my clinical judgment that the patient will require inpatient hospital care spanning beyond 2 midnights from the point of admission due to high intensity of service, high risk for further deterioration and high frequency of surveillance required.*   For questions or updates, please contact [facility] Please consult [email or web address] for contact info under    Signed, Gerri Spore T. Flora Lipps, MD, [facility] [place]  [facility]  06/14/2021 11:14 AM

## 2021-06-14 NOTE — Interval H&P Note (Signed)
Cath Lab Visit (complete for each Cath Lab visit)  Clinical Evaluation Leading to the Procedure:   ACS: Yes.    Non-ACS:    Anginal Classification: CCS IV  Anti-ischemic medical therapy: Minimal Therapy (1 class of medications)  Non-Invasive Test Results: No non-invasive testing performed  Prior CABG: No previous CABG      History and Physical Interval Note:  06/14/2021 3:02 PM  Matthew Mann  has presented today for surgery, with the diagnosis of nstemi.  The various methods of treatment have been discussed with the patient and family. After consideration of risks, benefits and other options for treatment, the patient has consented to  Procedure(s): LEFT HEART CATH AND CORONARY ANGIOGRAPHY (N/A) as a surgical intervention.  The patient's history has been reviewed, patient examined, no change in status, stable for surgery.  I have reviewed the patient's chart and labs.  Questions were answered to the patient's satisfaction.     Tonny Bollman

## 2021-06-14 NOTE — Progress Notes (Signed)
Received patient from [facility] via [facility], alert and oriented X4, skin warm and dry resp even and unlabored, and complaint of left upper shoulder pain a 1 out of 10.  Pt placed on bedside monitor, IVF infusing with Heparin in the left hand.  Consent signed and patient waiting for cath procedure.  Call bell in reach.

## 2021-06-14 NOTE — ED Provider Notes (Signed)
[facility] EMERGENCY DEPARTMENT Provider Note   CSN: [id number] Arrival date & time: 06/14/21  0930     History Chief Complaint  Patient presents with   Chest Pain    Matthew Mann is a 55 y.o. male.  He is here with a complaint of left-sided chest pressure that radiates into jaw and arm that started about 5 days ago.  Worse with exertion.  Feels slightly short of breath with it.  Nausea and lightheaded.  Has tried nothing for it.  No history of cardiac disease.  Thought it was muscular and tried ibuprofen without any improvement.  Risk factors of diabetes hyperlipidemia hypertension and family history.  Non-smoker.  Rates the pain 6 out of 10 currently 10 out of 10 at worst.  The history is provided by the patient.  Chest Pain Pain location:  Substernal area and L chest Pain quality: aching and pressure   Pain radiates to:  Neck and L shoulder Pain severity:  Severe Onset quality:  Gradual Duration:  5 days Timing:  Constant Progression:  Unchanged Chronicity:  New Relieved by:  Nothing Worsened by:  Exertion and certain positions Ineffective treatments:  Aspirin Associated symptoms: dizziness, nausea and shortness of breath   Associated symptoms: no abdominal pain, no cough, no diaphoresis, no fever, no palpitations and no vomiting   Risk factors: diabetes mellitus, high cholesterol, hypertension and male sex    HPI: A 55 year old patient with a history of treated diabetes, hypertension and hypercholesterolemia presents for evaluation of chest pain. Initial onset of pain was more than 6 hours ago. The patient's chest pain is described as heaviness/pressure/tightness and is worse with exertion. The patient complains of nausea. The patient's chest pain is middle- or left-sided, is not well-localized, is not sharp and does radiate to the arms/jaw/neck. The patient denies diaphoresis. The patient has a family history of coronary artery disease in a first-degree relative with onset  less than age 3. The patient has no history of stroke, has no history of peripheral artery disease, has not smoked in the past 90 days and does not have an elevated BMI (>=30).   Past Medical History:  Diagnosis Date   Diabetes mellitus without complication (HCC)    Hyperlipidemia    Hypertension     Patient Active Problem List   Diagnosis Date Noted   Type 2 diabetes mellitus with diabetic mononeuropathy, without long-term current use of insulin (HCC) 12/28/2019   Mixed hyperlipidemia 07/19/2016   Hypertrophy of prostate without urinary obstruction and other lower urinary tract symptoms (LUTS) 07/31/2010   Essential hypertension 07/31/2010    History reviewed. No pertinent surgical history.     Family History  Problem Relation Age of Onset   Hypertension Mother    Heart disease Sister    Heart attack Maternal Grandfather     Social History   Tobacco Use   Smoking status: Never   Smokeless tobacco: Never  Vaping Use   Vaping Use: Never used  Substance Use Topics   Alcohol use: Never   Drug use: Never    Home Medications Prior to Admission medications   Medication Sig Start Date End Date Taking? Authorizing Provider  amLODipine (NORVASC) 10 MG tablet Take 1 tablet (10 mg total) by mouth daily. 12/23/20   Gwenlyn Fudge, FNP  aspirin 81 MG EC tablet Take 1 tablet by mouth daily.    [provider]  atorvastatin (LIPITOR) 20 MG tablet Take 1 tablet (20 mg total) by mouth daily. 02/08/21  Dettinger, Elige Radon, MD  dapagliflozin propanediol (FARXIGA) 10 MG TABS tablet Take 1 tablet (10 mg total) by mouth daily. 05/31/21   Gwenlyn Fudge, FNP  glimepiride (AMARYL) 4 MG tablet Take 1 tablet (4 mg total) by mouth in the morning and at bedtime. 02/21/21   Gwenlyn Fudge, FNP  hydrochlorothiazide (HYDRODIURIL) 25 MG tablet Take 1 tablet (25 mg total) by mouth daily. 12/23/20   Gwenlyn Fudge, FNP  losartan (COZAAR) 100 MG tablet TAKE 1 TABLET BY MOUTH EVERY DAY  05/31/21   Gwenlyn Fudge, FNP  metFORMIN (GLUCOPHAGE-XR) 500 MG 24 hr tablet Take 1 tablet (500 mg total) by mouth 2 (two) times daily. 12/23/20   Gwenlyn Fudge, FNP  predniSONE (STERAPRED UNI-PAK 21 TAB) 10 MG (21) TBPK tablet As directed x 6 days 04/27/21   Gwenlyn Fudge, FNP    Allergies    Ace inhibitors  Review of Systems   Review of Systems  Constitutional:  Negative for diaphoresis and fever.  HENT:  Negative for sore throat.   Eyes:  Negative for visual disturbance.  Respiratory:  Positive for shortness of breath. Negative for cough.   Cardiovascular:  Positive for chest pain. Negative for palpitations.  Gastrointestinal:  Positive for nausea. Negative for abdominal pain and vomiting.  Genitourinary:  Negative for dysuria.  Musculoskeletal:  Negative for gait problem.  Skin:  Negative for rash.  Neurological:  Positive for dizziness.   Physical Exam Updated Vital Signs BP (!) 156/95 (BP Location: Left Arm)   Pulse 82   Temp 98.9 F (37.2 C) (Oral)   Resp 14   Ht 5\' 7"  (1.702 m)   Wt 78.9 kg   SpO2 99%   BMI 27.25 kg/m   Physical Exam Vitals and nursing note reviewed.  Constitutional:      Appearance: Normal appearance. He is well-developed.  HENT:     Head: Normocephalic and atraumatic.  Eyes:     Conjunctiva/sclera: Conjunctivae normal.  Cardiovascular:     Rate and Rhythm: Normal rate and regular rhythm.     Heart sounds: Normal heart sounds. No murmur heard. Pulmonary:     Effort: Pulmonary effort is normal. No respiratory distress.     Breath sounds: Normal breath sounds.  Abdominal:     Palpations: Abdomen is soft.     Tenderness: There is no abdominal tenderness.  Musculoskeletal:        General: Normal range of motion.     Cervical back: Neck supple.     Right lower leg: No tenderness. No edema.     Left lower leg: No tenderness. No edema.  Skin:    General: Skin is warm and dry.  Neurological:     General: No focal deficit present.      Mental Status: He is alert.    ED Results / Procedures / Treatments   Labs (all labs ordered are listed, but only abnormal results are displayed) Labs Reviewed  BASIC METABOLIC PANEL - Abnormal; Notable for the following components:      Result Value   Glucose, Bld 213 (*)    All other components within normal limits  TROPONIN I (HIGH SENSITIVITY) - Abnormal; Notable for the following components:   Troponin I (High Sensitivity) 278 (*)    All other components within normal limits  TROPONIN I (HIGH SENSITIVITY) - Abnormal; Notable for the following components:   Troponin I (High Sensitivity) 275 (*)    All other components within normal limits  TROPONIN I (HIGH SENSITIVITY) - Abnormal; Notable for the following components:   Troponin I (High Sensitivity) 285 (*)    All other components within normal limits  RESP PANEL BY RT-PCR (FLU A&B, COVID) ARPGX2  CBC  D-DIMER, QUANTITATIVE  GLUCOSE, CAPILLARY  LIPID PANEL  BASIC METABOLIC PANEL  CBC  TROPONIN I (HIGH SENSITIVITY)    EKG EKG Interpretation  Date/Time:  Wednesday June 14 2021 09:43:01 EDT Ventricular Rate:  84 PR Interval:  107 QRS Duration: 95 QT Interval:  359 QTC Calculation: 425 R Axis:   0 Text Interpretation: Sinus rhythm Short PR interval Low voltage, precordial leads Nonspecific T abnormalities, lateral leads Baseline wander in lead(s) III aVF V2 No old tracing to compare Confirmed by Meridee Score [phone]) on 06/14/2021 9:45:08 AM  Radiology CARDIAC CATHETERIZATION  Result Date: 06/14/2021 1.  Left dominant coronary system with patent left main, severe diffuse mid LAD stenosis, severe mid circumflex stenosis, severe left PDA stenosis, severe diagonal stenosis. 2.  Nondominant RCA with moderate diffuse plaquing 3.  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  We will resume heparin and consult cardiac  surgery for consideration of CABG.  His targets are suboptimal and if he is not felt to have suitable anatomy for CABG, he could be treated with targeted PCI and ongoing medical therapy.  If his targets are felt to be suitable for grafting, he would be more completely revascularized with CABG.   DG Chest Port 1 View  Result Date: 06/14/2021 CLINICAL DATA:  Left-sided chest pain EXAM: PORTABLE CHEST 1 VIEW COMPARISON:  None. FINDINGS: The heart size and mediastinal contours are within normal limits. No focal airspace consolidation, pleural effusion, or pneumothorax. The visualized skeletal structures are unremarkable. IMPRESSION: No active disease. Electronically Signed   By: Duanne Guess D.O.   On: 06/14/2021 10:34   CT Angio Chest Aorta W and/or Wo Contrast  Result Date: 06/14/2021 CLINICAL DATA:  Left-sided chest pain radiating to the left arm since this past Friday. Evaluate for thoracic aortic aneurysm. EXAM: CT ANGIOGRAPHY CHEST WITH CONTRAST TECHNIQUE: Multidetector CT imaging of the chest was performed using the standard protocol during bolus administration of intravenous contrast. Multiplanar CT image reconstructions and MIPs were obtained to evaluate the vascular anatomy. CONTRAST:  78mL OMNIPAQUE IOHEXOL 350 MG/ML SOLN COMPARISON:  None. FINDINGS: Vascular Findings: No evidence of thoracic aortic aneurysm with measurements as follows. There is a very minimal amount of eccentric noncalcified atherosclerotic plaque involving the proximal aspect of the descending thoracic aorta, not resulting in a hemodynamically significant stenosis. No evidence of thoracic aortic dissection or periaortic stranding on this nongated examination. Conventional configuration of the aortic arch. The branch vessels of the aortic arch appear widely patent throughout their imaged courses. Normal heart size.  No pericardial effusion. Although this examination was not tailored for the evaluation the pulmonary arteries,  there are no discrete filling defects within the central pulmonary arterial tree to suggest central pulmonary embolism. Normal caliber of the main pulmonary artery. ------------------------------------------------------------- Thoracic aortic measurements: SINOTUBULAR JUNCTION: 27 mm as measured in greatest oblique short axis coronal dimension. PROXIMAL ASCENDING THORACIC AORTA: 28 mm as measured in greatest oblique short axis axial dimension at the level of the main pulmonary artery in approximately 29 mm in greatest oblique short axis coronal diameter (coronal image 58, series 8) AORTIC ARCH: 22 mm as measured in greatest oblique short axis sagittal dimension. PROXIMAL DESCENDING  THORACIC AORTA: 24 mm as measured in greatest oblique short axis axial dimension at the level of the main pulmonary artery. DISTAL DESCENDING THORACIC AORTA: 21 mm as measured in greatest oblique short axis axial dimension at the level of the diaphragmatic hiatus. Review of the MIP images confirms the above findings. ------------------------------------------------------------- Non-Vascular Findings: Mediastinum/Lymph Nodes: Calcified mediastinal lymph nodes, the sequela of previous granulomatous infection. No bulky mediastinal, hilar or axillary lymphadenopathy. Lungs/Pleura: No focal airspace opacities. No pleural effusion or pneumothorax. The central pulmonary airways appear widely patent. Punctate (2 mm) calcified granuloma involving the medial basilar aspect the right lower lobe (image 107, series 7) as well as the left lower lobe (image 98, series 7). There is a punctate (approximately 4 mm) noncalcified nodule within the subpleural aspect the left lower lobe (image 129, series 7). Upper abdomen: Limited early arterial phase evaluation of the upper abdomen demonstrates diffuse decreased attenuation of the hepatic parenchyma suggestive of hepatic steatosis. Punctate granuloma are seen within the spleen. Musculoskeletal: No acute or  aggressive osseous abnormalities. Stigmata of dish within the thoracic spine. Regional soft tissues appear normal. IMPRESSION: 1. No acute cardiopulmonary disease. Specifically, no evidence of thoracic aortic aneurysm or dissection. 2. Minimal amount of atherosclerotic plaque within the thoracic aorta, not resulting in a hemodynamically significant stenosis. Aortic Atherosclerosis (ICD10-I70.0). 3. Sequela of previous granulomatous infection as above. 4. Punctate (4 mm) noncalcified left lower lobe pulmonary nodule. Correlates to previous outside examinations (if available), is advised. Otherwise, no follow-up needed if patient is low-risk. Non-contrast chest CT can be considered in 12 months if patient is high-risk. This recommendation follows the consensus statement: Guidelines for Management of Incidental Pulmonary Nodules Detected on CT Images: From the Fleischner Society 2017; Radiology 2017; 284:228-243. 5. Suspected hepatic steatosis.  Correlation with LFTs is advised. Electronically Signed   By: Simonne Come M.D.   On: 06/14/2021 14:17    Procedures .Critical Care Performed by: Terrilee Files, MD Authorized by: Terrilee Files, MD   Critical care provider statement:    Critical care time (minutes):  45   Critical care time was exclusive of:  Separately billable procedures and treating other patients   Critical care was necessary to treat or prevent imminent or life-threatening deterioration of the following conditions:  Cardiac failure   Critical care was time spent personally by me on the following activities:  Development of treatment plan with patient or surrogate, discussions with consultants, evaluation of patient's response to treatment, examination of patient, obtaining history from patient or surrogate, ordering and performing treatments and interventions, ordering and review of laboratory studies, ordering and review of radiographic studies, pulse oximetry and re-evaluation of  patient's condition   I assumed direction of critical care for this patient from another provider in my specialty: no     Medications Ordered in ED Medications  nitroGLYCERIN (NITROSTAT) SL tablet 0.4 mg ( Sublingual MAR Unhold 06/14/21 1616)  heparin bolus via infusion 4,000 Units (4,000 Units Intravenous Bolus from Bag 06/14/21 1118)    Followed by  heparin ADULT infusion 100 units/mL (25000 units/29mL) (0 Units/hr Intravenous Stopped 06/14/21 1515)  amLODipine (NORVASC) tablet 10 mg (has no administration in time range)  aspirin EC tablet 81 mg (has no administration in time range)  atorvastatin (LIPITOR) tablet 80 mg (has no administration in time range)  dapagliflozin propanediol (FARXIGA) tablet 10 mg (10 mg Oral Given 06/14/21 1740)  glimepiride (AMARYL) tablet 4 mg (has no administration in time range)  losartan (COZAAR) tablet 100  mg (has no administration in time range)  aspirin chewable tablet 81 mg (has no administration in time range)  insulin aspart (novoLOG) injection 0-15 Units (has no administration in time range)  0.9% sodium chloride infusion (0 mL/kg/hr  78.9 kg Intravenous Stopped 06/14/21 1342)  metoprolol tartrate (LOPRESSOR) tablet 25 mg ( Oral MAR Unhold 06/14/21 1616)  labetalol (NORMODYNE) injection 10 mg (has no administration in time range)  hydrALAZINE (APRESOLINE) injection 10 mg (has no administration in time range)  acetaminophen (TYLENOL) tablet 650 mg (has no administration in time range)  ondansetron (ZOFRAN) injection 4 mg (has no administration in time range)  0.9% sodium chloride infusion (1 mL/kg/hr  78.9 kg Intravenous Rate/Dose Change 06/14/21 1739)  sodium chloride flush (NS) 0.9 % injection 3 mL (has no administration in time range)  sodium chloride flush (NS) 0.9 % injection 3 mL (has no administration in time range)  0.9 %  sodium chloride infusion (has no administration in time range)  aspirin chewable tablet 324 mg (324 mg Oral Given  06/14/21 1004)  acetaminophen (TYLENOL) tablet 650 mg (650 mg Oral Given 06/14/21 1108)  ondansetron (ZOFRAN) injection 4 mg (4 mg Intravenous Given 06/14/21 1246)  iohexol (OMNIPAQUE) 350 MG/ML injection 75 mL (75 mLs Intravenous Contrast Given 06/14/21 1336)    ED Course  I have reviewed the triage vital signs and the nursing notes.  Pertinent labs & imaging results that were available during my care of the patient were reviewed by me and considered in my medical decision making (see chart for details).  Clinical Course as of 06/14/21 1901  Wed Jun 14, 2021  1006 Chest x-ray interpreted by me as normal mediastinum no pneumothorax.  Awaiting radiology reading. [MB]  1111 Discussed with Dr. Beverly Milch cardiology.  He agrees with current plan of heparin and he will work on getting the patient transferred to Cath Lab. [MB]  1117 Patient updated on plan. [MB]  1242 Patient had an episode of nausea and lightheadedness.  Dropped his blood pressure.  Nurse had another EKG which does not show any acute findings. [MB]    Clinical Course User Index [MB] Terrilee Files, MD   MDM Rules/Calculators/A&P HEAR Score: 6                        This patient complains of chest pain radiating into the left shoulder left neck left jaw worse with exertion; this involves an extensive number of treatment Options and is a complaint that carries with it a high risk of complications and Morbidity. The differential includes ACS, PE, pneumothorax, pneumonia, vascular  I ordered, reviewed and interpreted labs, which included CBC with normal white count normal hemoglobin, chemistries normal other than elevated glucose, troponins elevated but not rising, COVID and flu negative, D-dimer negative I ordered medication aspirin nitroglycerin heparin I ordered imaging studies which included chest x-ray and CT angio chest and I independently    visualized and interpreted imaging which showed no acute  findings. Additional history obtained from patient's wife Previous records obtained and reviewed in epic no recent admissions I consulted Dr. Bufford Buttner cardiology and discussed lab and imaging findings  Critical Interventions: Initiation of IV heparin for patient's NSTEMI, urgent cardiology consultation  After the interventions stated above, I reevaluated the patient and found patient's pain to be improving.  He had did have transient hypotension which has resolved.  He will be transferred to [facility] for catheterization.   Final Clinical Impression(s) /  ED Diagnoses Final diagnoses:  NSTEMI (non-ST elevated myocardial infarction) [facility])    Rx / DC Orders ED Discharge Orders     None        Terrilee Files, MD 06/14/21 1905

## 2021-06-15 ENCOUNTER — Ambulatory Visit: Payer: 59 | Admitting: Family Medicine

## 2021-06-15 ENCOUNTER — Inpatient Hospital Stay (HOSPITAL_COMMUNITY): Payer: 59

## 2021-06-15 ENCOUNTER — Encounter (HOSPITAL_COMMUNITY): Payer: Self-pay | Admitting: Cardiovascular Disease

## 2021-06-15 DIAGNOSIS — I214 Non-ST elevation (NSTEMI) myocardial infarction: Secondary | ICD-10-CM

## 2021-06-15 DIAGNOSIS — I25110 Atherosclerotic heart disease of native coronary artery with unstable angina pectoris: Secondary | ICD-10-CM

## 2021-06-15 DIAGNOSIS — E119 Type 2 diabetes mellitus without complications: Secondary | ICD-10-CM

## 2021-06-15 LAB — BASIC METABOLIC PANEL
Anion gap: 12 (ref 5–15)
BUN: 13 mg/dL (ref 6–20)
CO2: 21 mmol/L — ABNORMAL LOW (ref 22–32)
Calcium: 9.3 mg/dL (ref 8.9–10.3)
Chloride: 104 mmol/L (ref 98–111)
Creatinine, Ser: 0.94 mg/dL (ref 0.61–1.24)
GFR, Estimated: >60 mL/min (ref >60)
Glucose, Bld: 102 mg/dL — ABNORMAL HIGH (ref 70–99)
Potassium: 4.2 mmol/L (ref 3.5–5.1)
Sodium: 137 mmol/L (ref 135–145)

## 2021-06-15 LAB — ECHOCARDIOGRAM COMPLETE
AR max vel: 1.18 cm2
AV Area VTI: 1.22 cm2
AV Area mean vel: 1.17 cm2
AV Mean grad: 11 mmHg
AV Peak grad: 19.5 mmHg
Ao pk vel: 2.21 m/s
Area-P 1/2: 3.02 cm2
Height: 67 in
S' Lateral: 3.3 cm
Weight: 2698.43 oz

## 2021-06-15 LAB — CBC
HCT: 47.4 % (ref 39.0–52.0)
Hemoglobin: 16.1 g/dL (ref 13.0–17.0)
MCH: 29.5 pg (ref 26.0–34.0)
MCHC: 34 g/dL (ref 30.0–36.0)
MCV: 86.8 fL (ref 80.0–100.0)
Platelets: 233 10*3/uL (ref 150–400)
RBC: 5.46 MIL/uL (ref 4.22–5.81)
RDW: 12.8 % (ref 11.5–15.5)
WBC: 9.8 10*3/uL (ref 4.0–10.5)
nRBC: 0 % (ref 0.0–0.2)

## 2021-06-15 LAB — GLUCOSE, CAPILLARY
Glucose-Capillary: 106 mg/dL — ABNORMAL HIGH (ref 70–99)
Glucose-Capillary: 108 mg/dL — ABNORMAL HIGH (ref 70–99)
Glucose-Capillary: 104 mg/dL — ABNORMAL HIGH (ref 70–99)
Glucose-Capillary: 88 mg/dL (ref 70–99)

## 2021-06-15 LAB — LIPID PANEL
Cholesterol: 126 mg/dL (ref 0–200)
HDL: 35 mg/dL — ABNORMAL LOW (ref >40)
LDL Cholesterol: 73 mg/dL (ref 0–99)
Total CHOL/HDL Ratio: 3.6 RATIO
Triglycerides: 88 mg/dL (ref <150)
VLDL: 18 mg/dL (ref 0–40)

## 2021-06-15 LAB — HEPARIN LEVEL (UNFRACTIONATED)
Heparin Unfractionated: 0.56 IU/mL (ref 0.30–0.70)
Heparin Unfractionated: 0.19 IU/mL — ABNORMAL LOW (ref 0.30–0.70)
Heparin Unfractionated: 0.38 IU/mL (ref 0.30–0.70)

## 2021-06-15 MED ORDER — NITROGLYCERIN IN D5W 200-5 MCG/ML-% IV SOLN
INTRAVENOUS | Status: AC
  Administered 2021-06-15: 5 ug/min via INTRAVENOUS
  Filled 2021-06-15: qty 250

## 2021-06-15 MED ORDER — NITROGLYCERIN IN D5W 200-5 MCG/ML-% IV SOLN
0.0000 ug/min | INTRAVENOUS | Status: DC
  Administered 2021-06-17: 5 ug/min via INTRAVENOUS

## 2021-06-15 NOTE — Progress Notes (Addendum)
1728   Patient with same chest pain as at home, 2/10  NTG 0.4 mg given  Will notify MD  1746  Second NTG 0.4 mcg given, call to Theodore Demark PA requesting NTG drip per cardiology discussion earlier today.  1804 NTG drip started at per Minute, BPs set to Q 15, RN remaining in the room  to calm fears  1813 Discussed with patient and family that BPs will be frequent and for his to assess his pain when the cuff squeezes, will increase or decrease based on his pain level.  Patient and spouse understood the plan

## 2021-06-15 NOTE — Progress Notes (Signed)
Progress Note  Patient Name: Matthew Mann Date of Encounter: 06/15/2021  [facility] Cardiologist: O'Neal  Subjective   No chest pain. No events overnight  Inpatient Medications    Scheduled Meds:  amLODipine  10 mg Oral Daily   aspirin EC  81 mg Oral Daily   atorvastatin  80 mg Oral Daily   dapagliflozin propanediol  10 mg Oral Daily   glimepiride  4 mg Oral Q breakfast   insulin aspart  0-15 Units Subcutaneous TID WC   losartan  100 mg Oral Daily   metoprolol tartrate  25 mg Oral BID   sodium chloride flush  3 mL Intravenous Q12H   Continuous Infusions:  sodium chloride     heparin 1,000 Units/hr (06/15/21 0150)   PRN Meds: sodium chloride, acetaminophen, nitroGLYCERIN, ondansetron (ZOFRAN) IV, sodium chloride flush   Vital Signs    Vitals:   06/14/21 2200 06/14/21 2300 06/15/21 0356 06/15/21 0435  BP: 127/80 128/86 120/81   Pulse: 71 63 69   Resp: 15 15 17    Temp:  97.9 F (36.6 C) 97.9 F (36.6 C)   TempSrc:  Oral Oral   SpO2: 90% 94% 98%   Weight:    76.5 kg  Height:        Intake/Output Summary (Last 24 hours) at 06/15/2021 0721 Last data filed at 06/14/2021 1800 Gross per 24 hour  Intake 949.52 ml  Output --  Net 949.52 ml   Last 3 Weights 06/15/2021 06/14/2021 04/27/2021  Weight (lbs) 168 lb 10.4 oz 174 lb 172 lb  Weight (kg) 76.5 kg 78.926 kg 78.019 kg      Telemetry    sinus - Personally Reviewed  ECG    No AM EKG - Personally Reviewed  Physical Exam   GEN: No acute distress.   Neck: No JVD Cardiac: RRR, no murmurs, rubs, or gallops.  Respiratory: Clear to auscultation bilaterally. GI: Soft, nontender, non-distended  MS: No edema; No deformity. Neuro:  Nonfocal  Psych: Normal affect   Labs    High Sensitivity Troponin:   Recent Labs  Lab 06/14/21 0954 06/14/21 1139 06/14/21 1652 06/14/21 1825  TROPONINIHS 278* 275* 285* 265*     Chemistry Recent Labs  Lab 06/14/21 0954 06/15/21 0621  NA 137 137  K 3.8 4.2   CL 104 104  CO2 24 21*  GLUCOSE 213* 102*  BUN 17 13  CREATININE 1.11 0.94  CALCIUM 9.3 9.3  GFRNONAA >60 >60  ANIONGAP 9 12    Lipids  Recent Labs  Lab 06/15/21 0621  CHOL 126  TRIG 88  HDL 35*  LDLCALC 73  CHOLHDL 3.6    Hematology Recent Labs  Lab 06/14/21 0954 06/15/21 0621  WBC 9.4 9.8  RBC 5.57 5.46  HGB 16.6 16.1  HCT 48.5 47.4  MCV 87.1 86.8  MCH 29.8 29.5  MCHC 34.2 34.0  RDW 12.7 12.8  PLT 267 233   Thyroid No results for input(s): TSH, FREET4 in the last 168 hours.  BNPNo results for input(s): BNP, PROBNP in the last 168 hours.  DDimer  Recent Labs  Lab 06/14/21 0954  DDIMER <0.27     Radiology    CARDIAC CATHETERIZATION  Result Date: 06/14/2021 1.  Left dominant coronary system with patent left main, severe diffuse mid LAD stenosis, severe mid circumflex stenosis, severe left PDA stenosis, severe diagonal stenosis. 2.  Nondominant RCA with moderate diffuse plaquing 3.  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  We will resume heparin and consult cardiac surgery for consideration of CABG.  His targets are suboptimal and if he is not felt to have suitable anatomy for CABG, he could be treated with targeted PCI and ongoing medical therapy.  If his targets are felt to be suitable for grafting, he would be more completely revascularized with CABG.   DG Chest Port 1 View  Result Date: 06/14/2021 CLINICAL DATA:  Left-sided chest pain EXAM: PORTABLE CHEST 1 VIEW COMPARISON:  None. FINDINGS: The heart size and mediastinal contours are within normal limits. No focal airspace consolidation, pleural effusion, or pneumothorax. The visualized skeletal structures are unremarkable. IMPRESSION: No active disease. Electronically Signed   By: Duanne Guess D.O.   On: 06/14/2021 10:34   CT Angio Chest Aorta W and/or Wo Contrast  Result Date:  06/14/2021 CLINICAL DATA:  Left-sided chest pain radiating to the left arm since this past Friday. Evaluate for thoracic aortic aneurysm. EXAM: CT ANGIOGRAPHY CHEST WITH CONTRAST TECHNIQUE: Multidetector CT imaging of the chest was performed using the standard protocol during bolus administration of intravenous contrast. Multiplanar CT image reconstructions and MIPs were obtained to evaluate the vascular anatomy. CONTRAST:  7mL OMNIPAQUE IOHEXOL 350 MG/ML SOLN COMPARISON:  None. FINDINGS: Vascular Findings: No evidence of thoracic aortic aneurysm with measurements as follows. There is a very minimal amount of eccentric noncalcified atherosclerotic plaque involving the proximal aspect of the descending thoracic aorta, not resulting in a hemodynamically significant stenosis. No evidence of thoracic aortic dissection or periaortic stranding on this nongated examination. Conventional configuration of the aortic arch. The branch vessels of the aortic arch appear widely patent throughout their imaged courses. Normal heart size.  No pericardial effusion. Although this examination was not tailored for the evaluation the pulmonary arteries, there are no discrete filling defects within the central pulmonary arterial tree to suggest central pulmonary embolism. Normal caliber of the main pulmonary artery. ------------------------------------------------------------- Thoracic aortic measurements: SINOTUBULAR JUNCTION: 27 mm as measured in greatest oblique short axis coronal dimension. PROXIMAL ASCENDING THORACIC AORTA: 28 mm as measured in greatest oblique short axis axial dimension at the level of the main pulmonary artery in approximately 29 mm in greatest oblique short axis coronal diameter (coronal image 58, series 8) AORTIC ARCH: 22 mm as measured in greatest oblique short axis sagittal dimension. PROXIMAL DESCENDING THORACIC AORTA: 24 mm as measured in greatest oblique short axis axial dimension at the level of the main  pulmonary artery. DISTAL DESCENDING THORACIC AORTA: 21 mm as measured in greatest oblique short axis axial dimension at the level of the diaphragmatic hiatus. Review of the MIP images confirms the above findings. ------------------------------------------------------------- Non-Vascular Findings: Mediastinum/Lymph Nodes: Calcified mediastinal lymph nodes, the sequela of previous granulomatous infection. No bulky mediastinal, hilar or axillary lymphadenopathy. Lungs/Pleura: No focal airspace opacities. No pleural effusion or pneumothorax. The central pulmonary airways appear widely patent. Punctate (2 mm) calcified granuloma involving the medial basilar aspect the right lower lobe (image 107, series 7) as well as the left lower lobe (image 98, series 7). There is a punctate (approximately 4 mm) noncalcified nodule within the subpleural aspect the left lower lobe (image 129, series 7). Upper abdomen: Limited early arterial phase evaluation of the upper abdomen demonstrates diffuse decreased attenuation of the hepatic parenchyma suggestive of hepatic steatosis. Punctate granuloma are seen within the spleen. Musculoskeletal: No acute or aggressive osseous abnormalities. Stigmata of dish within the thoracic spine. Regional soft tissues  appear normal. IMPRESSION: 1. No acute cardiopulmonary disease. Specifically, no evidence of thoracic aortic aneurysm or dissection. 2. Minimal amount of atherosclerotic plaque within the thoracic aorta, not resulting in a hemodynamically significant stenosis. Aortic Atherosclerosis (ICD10-I70.0). 3. Sequela of previous granulomatous infection as above. 4. Punctate (4 mm) noncalcified left lower lobe pulmonary nodule. Correlates to previous outside examinations (if available), is advised. Otherwise, no follow-up needed if patient is low-risk. Non-contrast chest CT can be considered in 12 months if patient is high-risk. This recommendation follows the consensus statement: Guidelines for  Management of Incidental Pulmonary Nodules Detected on CT Images: From the Fleischner Society 2017; Radiology 2017; 284:228-243. 5. Suspected hepatic steatosis.  Correlation with LFTs is advised. Electronically Signed   By: Simonne Come M.D.   On: 06/14/2021 14:17    Cardiac Studies     Patient Profile     55 y.o. male with DM, HTN, HLD admitted 06/14/21 with a NSTEMI. 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   Assessment & Plan    NSTEMI: Cardiac cath with severe two vessel CAD. CT surgery consulted for CABG. No chest pain today. Continue ASA, statin, beta blocker and IV heparin.  Echo today HTN: BP controlled Diabetes mellitus: SSI.   For questions or updates, please contact [facility] Please consult [email or web address] for contact info under     Signed, Verne Carrow, MD  06/15/2021, 7:21 AM

## 2021-06-15 NOTE — Progress Notes (Signed)
ANTICOAGULATION CONSULT NOTE  Pharmacy Consult for heparin Indication: chest pain/ACS  Allergies  Allergen Reactions   Ace Inhibitors Swelling and Other (See Comments)    Tongue swelling and cough.     Patient Measurements: Height: 5\' 7"  (170.2 cm) Weight: 76.5 kg (168 lb 10.4 oz) IBW/kg (Calculated) : 66.1 HEPARIN DW (KG): 78.9   Vital Signs: Temp: 97.6 F (36.4 C) (10/13 1930) Temp Source: Oral (10/13 1930) BP: 127/97 (10/13 1930) Pulse Rate: 80 (10/13 1930)  Labs: Recent Labs    06/14/21 0954 06/14/21 1139 06/14/21 1652 06/14/21 1825 06/15/21 0621 06/15/21 1246 06/15/21 2038  HGB 16.6  --   --   --  16.1  --   --   HCT 48.5  --   --   --  47.4  --   --   PLT 267  --   --   --  233  --   --   HEPARINUNFRC  --   --   --   --  0.56 0.19* 0.38  CREATININE 1.11  --   --   --  0.94  --   --   TROPONINIHS 278* 275* 285* 265*  --   --   --      Estimated Creatinine Clearance: 85 mL/min (by C-G formula based on SCr of 0.94 mg/dL).  Assessment: Patient presented with chest pain that radiates into left arm and jaw, and pharmacy asked to dose heparin.  Cath reveals severe multivessel CAD and TCTS consulted for possible CABG (possible procedure on 10/17)  Heparin level of 0.38 is therapeutic on heparin 1200 units/hr.   Goal of Therapy:  Heparin level 0.3-0.7 units/ml Monitor platelets by anticoagulation protocol: Yes   Plan Continue heparin at 1200 units/hr  Next heparin level with AM labs   11/17, PharmD, BCPS Clinical Pharmacist 06/15/2021 10:32 PM

## 2021-06-15 NOTE — Plan of Care (Signed)

## 2021-06-15 NOTE — Consult Note (Addendum)
[address]       [place] [address]             [phone]        Matthew Mann [facility] Medical Record #[id number] Date of Birth: 1966/03/16  Referring: Verne Carrow, MD Primary Care: Gwenlyn Fudge, FNP Primary Cardiologist:None  Reason for consult: Consideration of coronary bypass grafting for severe multivessel coronary artery disease   History of Present Illness:     [name] is a 55 year old male with past history significant for type 2 diabetes, hypertension, and dyslipidemia.  He also has a positive family history for premature coronary artery disease.  He presented to the emergency room yesterday given a 4-day history of pressure-like for occurring intermittently over the previous 4 days.  In the emergency room, EKG showed sinus rhythm with nonspecific ST-T wave changes.  High-sensitivity troponin was 278.  Chest x-ray showed no active disease and CT scan of the chest showed cardiopulmonary disease.  There was minimal atherosclerotic plaque in the thoracic aorta.  There were some mild granulomatous changes in the lungs as well as a 4 mm noncalcified left lower lobe pulmonary nodule.  Having ruled in for acute non-ST elevation myocardial infarction, [name] was admitted to the hospital and started on a heparin drip along with PRN SL NTG, high intensity statin, aspirin, and a beta-blocker.  He remained stable following admission with no further chest pain.  He was taken to the Cath Lab yesterday for left heart catheterization which demonstrated a left dominant system with high-grade obstruction is in the LAD and circumflex distributions.  Please see the cath report below.  Left ventricular function was well-preserved.  An echocardiogram has been done this morning with results still pending.  Currently , [name] is free of chest pain and resting comfortably in bed with his wife at the bedside. He denies having any previous surgeries or injuries.  He sees a dentist regularly with the last visit within the past month. He denies any lower extremity claudication or varicosities.   Current Activity/ Functional Status:  Zubrod Score: At the time of surgery this patient's most appropriate activity status/level should be described as: []     0    Normal activity, no symptom [x]     1    Restricted in physical strenuous activity but ambulatory, able to do out light work []     2    Ambulatory and capable of self care, unable to do work activities, up and about                 more than 50%  Of the time                            []     3    Only limited self care, in bed greater than 50% of waking hours []     4    Completely disabled, no self care, confined to bed or chair []     5    Moribund  Past Medical History:  Diagnosis Date   Diabetes mellitus without complication (HCC)    Hyperlipidemia    Hypertension     Past Surgical History:  Procedure Laterality Date   LEFT HEART CATH AND CORONARY ANGIOGRAPHY N/A 06/14/2021   Procedure: LEFT HEART CATH AND CORONARY ANGIOGRAPHY;  Surgeon: Tonny Bollman, MD;  Location: [facility] INVASIVE CV LAB;  Service: Cardiovascular;  Laterality: N/A;    Social History   Tobacco Use  Smoking Status Never  Smokeless Tobacco Never    Social History   Substance and Sexual Activity  Alcohol Use Never     Allergies  Allergen Reactions   Ace Inhibitors Swelling and Other (See Comments)    Tongue swelling and cough.     Current Facility-Administered Medications  Medication Dose Route Frequency Provider Last Rate Last Admin   0.9 %  sodium chloride infusion  250 mL Intravenous PRN Tonny Bollman, MD       acetaminophen (TYLENOL) tablet 650 mg  650 mg Oral Q4H PRN Tonny Bollman, MD       amLODipine (NORVASC) tablet 10 mg  10 mg Oral Daily Creig Hines, NP   10 mg at 06/15/21 4098   aspirin EC tablet 81 mg  81 mg Oral Daily Creig Hines, NP   81 mg at 06/15/21 0953    atorvastatin (LIPITOR) tablet 80 mg  80 mg Oral Daily Creig Hines, NP   80 mg at 06/15/21 1191   dapagliflozin propanediol (FARXIGA) tablet 10 mg  10 mg Oral Daily Creig Hines, NP   10 mg at 06/15/21 0954   glimepiride (AMARYL) tablet 4 mg  4 mg Oral Q breakfast Creig Hines, NP   4 mg at 06/15/21 0801   heparin ADULT infusion 100 units/mL (25000 units/244mL)  1,000 Units/hr Intravenous Continuous Gerilyn Nestle, RPH 10 mL/hr at 06/15/21 1005 1,000 Units/hr at 06/15/21 1005   insulin aspart (novoLOG) injection 0-15 Units  0-15 Units Subcutaneous TID WC Creig Hines, NP       losartan (COZAAR) tablet 100 mg  100 mg Oral Daily Creig Hines, NP   50 mg at 06/15/21 0954   metoprolol tartrate (LOPRESSOR) tablet 25 mg  25 mg Oral BID Sande Rives, MD   25 mg at 06/15/21 0954   nitroGLYCERIN (NITROSTAT) SL tablet 0.4 mg  0.4 mg Sublingual Q5 min PRN Creig Hines, NP   0.4 mg at 06/14/21 1222   ondansetron (ZOFRAN) injection 4 mg  4 mg Intravenous Q6H PRN Tonny Bollman, MD       sodium chloride flush (NS) 0.9 % injection 3 mL  3 mL Intravenous Q12H Tonny Bollman, MD       sodium chloride flush (NS) 0.9 % injection 3 mL  3 mL Intravenous PRN Tonny Bollman, MD        Medications Prior to Admission  Medication Sig Dispense Refill Last Dose   amLODipine (NORVASC) 10 MG tablet Take 1 tablet (10 mg total) by mouth daily. 90 tablet 1 06/14/2021   aspirin 81 MG EC tablet Take 1 tablet by mouth daily.   06/13/2021   atorvastatin (LIPITOR) 20 MG tablet Take 1 tablet (20 mg total) by mouth daily. 90 tablet 0 06/14/2021   dapagliflozin propanediol (FARXIGA) 10 MG TABS tablet Take 1 tablet (10 mg total) by mouth daily. 90 tablet 0 06/13/2021   glimepiride (AMARYL) 4 MG tablet Take 1 tablet (4 mg total) by mouth in the morning and at bedtime. 180 tablet 1 06/13/2021   hydrochlorothiazide (HYDRODIURIL) 25 MG tablet Take 1  tablet (25 mg total) by mouth daily. 90 tablet 1 06/13/2021   losartan (COZAAR) 100 MG tablet TAKE 1 TABLET BY MOUTH EVERY DAY 90 tablet 0 06/14/2021   metFORMIN (GLUCOPHAGE-XR) 500 MG 24 hr tablet Take 1 tablet (500 mg total) by mouth 2 (two) times daily. (Patient  taking differently: Take 500 mg by mouth daily.) 180 tablet 1 06/13/2021   predniSONE (STERAPRED UNI-PAK 21 TAB) 10 MG (21) TBPK tablet As directed x 6 days (Patient not taking: No sig reported) 21 tablet 0 Completed Course    Family History  Problem Relation Age of Onset   Hypertension Mother    Heart disease Sister    Heart attack Maternal Grandfather      Review of Systems:       Cardiac Review of Systems: Y or  [    ]= no  Chest Pain [   x ]  Resting SOB [   ] Exertional SOB  [ x ]  Orthopnea [  ]   Pedal Edema [   ]    Palpitations [  ] Syncope  [  ]   Presyncope [   ]  General Review of Systems: [Y] = yes [  ]=no Constitional: recent weight change [  ]; anorexia [  ]; fatigue [  ]; nausea [  ]; night sweats [  ]; fever [  ]; or chills [  ]                                                               Dental: Last Dentist visit: last month  Eye : blurred vision [  ]; diplopia [   ]; vision changes [  ];  Amaurosis fugax[  ]; Resp: cough [  ];  wheezing[  ];  hemoptysis[  ]; shortness of breath[  ]; paroxysmal nocturnal dyspnea[  ]; dyspnea on exertion[  ]; or orthopnea[  ];  GI:  gallstones[  ], vomiting[  ];  dysphagia[  ]; melena[  ];  hematochezia [  ]; heartburn[  ];   Hx of  Colonoscopy[  ]; GU: kidney stones [  ]; hematuria[  ];   dysuria [  ];  nocturia[  ];  history of     obstruction [  ]; urinary frequency [  ]             Skin: rash, swelling[  ];, hair loss[  ];  peripheral edema[  ];  or itching[  ]; Musculosketetal: myalgias[  ];  joint swelling[  ];  joint erythema[  ];  joint pain[  ];  back pain[  ];  Heme/Lymph: bruising[  ];  bleeding[  ];  anemia[  ];  Neuro: TIA[  ];  headaches[  ];  stroke[  ];   vertigo[  ];  seizures[  ];   paresthesias[  ];  difficulty walking[  ];  Psych:depression[  ]; anxiety[  ];  Endocrine: diabetes[ y ];  thyroid dysfunction[  ];        Physical Exam: BP (!) 143/91 (BP Location: Left Arm)   Pulse 78   Temp 98 F (36.7 C) (Oral)   Resp 20   Ht  (1.702 m)   Wt 76.5 kg   SpO2 95%   BMI 26.41 kg/m    General appearance: alert, cooperative, and no distress Head: Normocephalic, without obvious abnormality, atraumatic Neck: no adenopathy, no carotid bruit, no JVD, and supple, symmetrical, trachea midline Lymph nodes: no cervical or clavicular adenopathy Resp: clear to auscultation bilaterally Cardio: regular rate and rhythm, S1, S2  normal, no murmur, click, rub or gallop GI: soft, non-tender; bowel sounds normal; no masses,  no organomegaly Extremities: no deformity, all well perfused with palpable pulses. The modified Allen's test using a pulse oximetry probe on the left thumb shows no change I the wave form with the left radial artery occluded. Neurologic: Grossly normal  Diagnostic Studies & Laboratory data:     Recent Radiology Findings:    CARDIAC CATHETERIZATION   Conclusion  1.  Left dominant coronary system with patent left main, severe diffuse mid LAD stenosis, severe mid circumflex stenosis, severe left PDA stenosis, severe diagonal stenosis. 2.  Nondominant RCA with moderate diffuse plaquing 3.  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  We will resume heparin and consult cardiac surgery for consideration of CABG.  His targets are suboptimal and if he is not felt to have suitable anatomy for CABG, he could be treated with targeted PCI and ongoing medical therapy.  If his targets are felt to be suitable for grafting, he would be more completely revascularized with CABG.   Procedural Details  Technical Details INDICATION:  NSTEMI. 55 yo diabetic male with typical symptoms of acute coronary syndrome, referred for cardiac catheterization.  PROCEDURAL DETAILS: The right wrist is prepped, draped, and anesthetized with 1% lidocaine. Using the modified Seldinger technique, a 5/6 French Slender sheath is introduced into the right radial artery. 3 mg of verapamil is administered through the sheath, weight-based unfractionated heparin was administered intravenously. Standard Judkins catheters are used for selective coronary angiography. LV pressure is recorded and an aortic valve pullback gradient is measured.  Left ventriculography is performed through the JR4 catheter.  Catheter exchanges are performed over an exchange length guidewire. There are no immediate procedural complications. A TR band is used for radial hemostasis at the completion of the procedure.  The patient was transferred to the post catheterization recovery area for further monitoring.      Estimated blood loss <50 mL.   During this procedure medications were administered to achieve and maintain moderate conscious sedation while the patient's heart rate, blood pressure, and oxygen saturation were continuously monitored and I was present face-to-face 100% of this time.   Medications (Filter: Administrations occurring from 1508 to 1607 on 06/14/21)  important  Continuous medications are totaled by the amount administered until 06/14/21 1607.    Heparin (Porcine) in NaCl 1000-0.9 UT/500ML-% SOLN (mL) Total volume:  1,000 mL  Date/Time Rate/Dose/Volume Action   06/14/21 1520 500 mL Given   1520 500 mL Given    midazolam (VERSED) injection (mg) Total dose:  2 mg  Date/Time Rate/Dose/Volume Action   06/14/21 1527 2 mg Given    fentaNYL (SUBLIMAZE) injection (mcg) Total dose:  25 mcg  Date/Time Rate/Dose/Volume Action   06/14/21 1527 25 mcg Given    lidocaine (PF) (XYLOCAINE) 1 % injection (mL) Total volume:  2 mL  Date/Time Rate/Dose/Volume  Action   06/14/21 1530 2 mL Given    Radial Cocktail/Verapamil only (mL) Total volume:  10 mL  Date/Time Rate/Dose/Volume Action   06/14/21 1533 10 mL Given    heparin sodium (porcine) injection (Units) Total dose:  4,000 Units  Date/Time Rate/Dose/Volume Action   06/14/21 1537 4,000 Units Given    iohexol (OMNIPAQUE) 350 MG/ML injection (mL) Total volume:  85 mL  Date/Time Rate/Dose/Volume Action   06/14/21 1557 85 mL Given    metoprolol tartrate (LOPRESSOR) tablet  25 mg (mg) Total dose:  Cannot be calculated* Dosing weight:  78.9  *Administration dose not documented Date/Time Rate/Dose/Volume Action   06/14/21 1508 *Not included in total MAR Hold    nitroGLYCERIN (NITROSTAT) SL tablet 0.4 mg (mg) Total dose:  Cannot be calculated* Dosing weight:  78.9  *Administration dose not documented Date/Time Rate/Dose/Volume Action   06/14/21 1508 *Not included in total MAR Hold    atorvastatin (LIPITOR) tablet 80 mg (mg) Total dose:  Cannot be calculated* Dosing weight:  78.9  *Administration dose not documented Date/Time Rate/Dose/Volume Action   06/14/21 1508 *Not included in total MAR Hold    Sedation Time  Sedation Time Physician-1:    Contrast  Medication Name Total Dose  iohexol (OMNIPAQUE) 350 MG/ML injection 85 mL    Radiation/Fluoro  Fluoro time: 7 (min) DAP: 20616 (mGycm2) Cumulative Air Kerma: 366 (mGy)   Coronary Findings   Diagnostic Dominance: Left  Left Anterior Descending  Prox LAD to Mid LAD lesion is 90% stenosed.  First Diagonal Branch  1st Diag lesion is 95% stenosed.  Second Diagonal Branch  2nd Diag lesion is 50% stenosed.  Left Circumflex  Mid Cx lesion is 80% stenosed. The lesion is focal and eccentric.  Left Posterior Descending Artery  LPDA lesion is 70% stenosed.  Left Posterior Atrioventricular Artery  LPAV lesion is 90% stenosed.  Right Coronary Artery  Vessel is small. There is moderate diffuse disease throughout the  vessel.   Intervention   No interventions have been documented.  Wall Motion              Left Heart  Left Ventricle The left ventricular size is normal. The left ventricular systolic function is normal. The left ventricular ejection fraction is 55-65% by visual estimate. No regional wall motion abnormalities.   Coronary Diagrams   Diagnostic Dominance: Left       DG Chest Port 1 View  Result Date: 06/14/2021 CLINICAL DATA:  Left-sided chest pain EXAM: PORTABLE CHEST 1 VIEW COMPARISON:  None. FINDINGS: The heart size and mediastinal contours are within normal limits. No focal airspace consolidation, pleural effusion, or pneumothorax. The visualized skeletal structures are unremarkable. IMPRESSION: No active disease. Electronically Signed   By: Duanne Guess D.O.   On: 06/14/2021 10:34   CT Angio Chest Aorta W and/or Wo Contrast  Result Date: 06/14/2021 CLINICAL DATA:  Left-sided chest pain radiating to the left arm since this past Friday. Evaluate for thoracic aortic aneurysm. EXAM: CT ANGIOGRAPHY CHEST WITH CONTRAST TECHNIQUE: Multidetector CT imaging of the chest was performed using the standard protocol during bolus administration of intravenous contrast. Multiplanar CT image reconstructions and MIPs were obtained to evaluate the vascular anatomy. CONTRAST:  34mL OMNIPAQUE IOHEXOL 350 MG/ML SOLN COMPARISON:  None. FINDINGS: Vascular Findings: No evidence of thoracic aortic aneurysm with measurements as follows. There is a very minimal amount of eccentric noncalcified atherosclerotic plaque involving the proximal aspect of the descending thoracic aorta, not resulting in a hemodynamically significant stenosis. No evidence of thoracic aortic dissection or periaortic stranding on this nongated examination. Conventional configuration of the aortic arch. The branch vessels of the aortic arch appear widely patent throughout their imaged courses. Normal heart size.  No pericardial  effusion. Although this examination was not tailored for the evaluation the pulmonary arteries, there are no discrete filling defects within the central pulmonary arterial tree to suggest central pulmonary embolism. Normal caliber of the main pulmonary artery. ------------------------------------------------------------- Thoracic aortic measurements: SINOTUBULAR JUNCTION: 27 mm as measured in  greatest oblique short axis coronal dimension. PROXIMAL ASCENDING THORACIC AORTA: 28 mm as measured in greatest oblique short axis axial dimension at the level of the main pulmonary artery in approximately 29 mm in greatest oblique short axis coronal diameter (coronal image 58, series 8) AORTIC ARCH: 22 mm as measured in greatest oblique short axis sagittal dimension. PROXIMAL DESCENDING THORACIC AORTA: 24 mm as measured in greatest oblique short axis axial dimension at the level of the main pulmonary artery. DISTAL DESCENDING THORACIC AORTA: 21 mm as measured in greatest oblique short axis axial dimension at the level of the diaphragmatic hiatus. Review of the MIP images confirms the above findings. ------------------------------------------------------------- Non-Vascular Findings: Mediastinum/Lymph Nodes: Calcified mediastinal lymph nodes, the sequela of previous granulomatous infection. No bulky mediastinal, hilar or axillary lymphadenopathy. Lungs/Pleura: No focal airspace opacities. No pleural effusion or pneumothorax. The central pulmonary airways appear widely patent. Punctate (2 mm) calcified granuloma involving the medial basilar aspect the right lower lobe (image 107, series 7) as well as the left lower lobe (image 98, series 7). There is a punctate (approximately 4 mm) noncalcified nodule within the subpleural aspect the left lower lobe (image 129, series 7). Upper abdomen: Limited early arterial phase evaluation of the upper abdomen demonstrates diffuse decreased attenuation of the hepatic parenchyma suggestive of  hepatic steatosis. Punctate granuloma are seen within the spleen. Musculoskeletal: No acute or aggressive osseous abnormalities. Stigmata of dish within the thoracic spine. Regional soft tissues appear normal. IMPRESSION: 1. No acute cardiopulmonary disease. Specifically, no evidence of thoracic aortic aneurysm or dissection. 2. Minimal amount of atherosclerotic plaque within the thoracic aorta, not resulting in a hemodynamically significant stenosis. Aortic Atherosclerosis (ICD10-I70.0). 3. Sequela of previous granulomatous infection as above. 4. Punctate (4 mm) noncalcified left lower lobe pulmonary nodule. Correlates to previous outside examinations (if available), is advised. Otherwise, no follow-up needed if patient is low-risk. Non-contrast chest CT can be considered in 12 months if patient is high-risk. This recommendation follows the consensus statement: Guidelines for Management of Incidental Pulmonary Nodules Detected on CT Images: From the Fleischner Society 2017; Radiology 2017; 284:228-243. 5. Suspected hepatic steatosis.  Correlation with LFTs is advised. Electronically Signed   By: Simonne Come M.D.   On: 06/14/2021 14:17     I have independently reviewed the above radiologic studies and discussed with the patient   Recent Lab Findings: Lab Results  Component Value Date   WBC 9.8 06/15/2021   HGB 16.1 06/15/2021   HCT 47.4 06/15/2021   PLT 233 06/15/2021   GLUCOSE 102 (H) 06/15/2021   CHOL 126 06/15/2021   TRIG 88 06/15/2021   HDL 35 (L) 06/15/2021   LDLCALC 73 06/15/2021   ALT 30 04/27/2021   AST 19 04/27/2021   NA 137 06/15/2021   K 4.2 06/15/2021   CL 104 06/15/2021   CREATININE 0.94 06/15/2021   BUN 13 06/15/2021   CO2 21 (L) 06/15/2021   HGBA1C 8.9 (H) 04/27/2021      Assessment / Plan:    -Pleasant 55 year old gentleman admitted for evaluation of intermittent chest pain of 4 days duration and ruled in for non-ST elevation myocardial infarction.  Left heart  catheterization demonstrates severe three-vessel coronary artery disease.  He has a left-dominant system and left ventricular function is well-preserved on ventriculogram.  The echocardiogram is pending.  The distal coronaries are small but he does appear to have a suitable targets for grafting.   Coronary bypass grafting and the expected perioperative course was described to [name]  and his wife and their questions were answered. They would like for [place] to proceed with preoperative work-up in anticipation of coronary bypass grafting.  Dr. Cliffton Asters will review the clinical data and the coronary angiography later. We will tentatively plan for surgery on Monday, 10/17.  -Type 2 diabetes mellitus-- A1c is 8.9 on admission, was 11.7 in April of this year.  Managed at home with Amaryl, Marcelline Deist, and metformin.  -Dyslipidemia-on high intensity statin therapy    -Hypertension-managed with amlodipine and losartan prior to admission    I  spent 25 minutes counseling the patient face to face.   Leary Roca, PA-C  06/15/2021 10:13 AM    Agree with above.  This is a 55 year old gentleman presents with an NSTEMI.  He underwent a left heart catheterization which showed severe three-vessel coronary disease.  His echocardiogram shows preserved biventricular function.  He also underwent a CTA of the chest which shows a 28 mm a sending aorta.  He has no mitral valve or tricuspid valve disease.  On review of his left heart catheterization he does have diffuse disease consistent with diabetes, however given his age I think that his best option would be surgical revascularization.  We discussed the risks and benefits of this, and we will plan for coronary artery bypass grafting with the use of the left radial artery as well.  He is tentatively scheduled for early next week.  Addalynne Golding Keane Scrape

## 2021-06-15 NOTE — Progress Notes (Addendum)
ANTICOAGULATION CONSULT NOTE  Pharmacy Consult for heparin Indication: chest pain/ACS  Allergies  Allergen Reactions   Ace Inhibitors Swelling and Other (See Comments)    Tongue swelling and cough.     Patient Measurements: Height: 5\' 7"  (170.2 cm) Weight: 76.5 kg (168 lb 10.4 oz) IBW/kg (Calculated) : 66.1 HEPARIN DW (KG): 78.9   Vital Signs: Temp: 97.9 F (36.6 C) (10/13 1110) Temp Source: Oral (10/13 1110) BP: 128/85 (10/13 1110) Pulse Rate: 77 (10/13 1110)  Labs: Recent Labs    06/14/21 0954 06/14/21 1139 06/14/21 1652 06/14/21 1825 06/15/21 0621  HGB 16.6  --   --   --  16.1  HCT 48.5  --   --   --  47.4  PLT 267  --   --   --  233  HEPARINUNFRC  --   --   --   --  0.56  CREATININE 1.11  --   --   --  0.94  TROPONINIHS 278* 275* 285* 265*  --      Estimated Creatinine Clearance: 85 mL/min (by C-G formula based on SCr of 0.94 mg/dL).  Assessment: Patient presented with chest pain that radiates into left arm and jaw, and pharmacy asked to dose heparin.  Cath reveals severe multivessel CAD and TCTS consulted for possible CABG (possible procedure on 10/17) -heparin level at goal on 1000 units/hr   Goal of Therapy:  Heparin level 0.3-0.7 units/ml Monitor platelets by anticoagulation protocol: Yes   Plan:  Continue heparin infusion at 1000 units/hr Recheck heparin level today Daily heparin level and CBC  11/17, PharmD Clinical Pharmacist **Pharmacist phone directory can now be found on [email or web address] ([email or web address]).  Listed under [facility].  Addendum -heparin level= 0.19  Plan -increase heparin to 1200 units/hr -Heparin level in 6 hours and daily wth CBC daily  [facility], PharmD Clinical Pharmacist **Pharmacist phone directory can now be found on [email or web address] ([email or web address]).  Listed under [facility].

## 2021-06-15 NOTE — Progress Notes (Signed)
  Echocardiogram 2D Echocardiogram has been performed.  Matthew Mann F 06/15/2021, 9:12 AM

## 2021-06-16 ENCOUNTER — Inpatient Hospital Stay (HOSPITAL_COMMUNITY): Payer: 59

## 2021-06-16 DIAGNOSIS — Z01810 Encounter for preprocedural cardiovascular examination: Secondary | ICD-10-CM | POA: Diagnosis not present

## 2021-06-16 DIAGNOSIS — I214 Non-ST elevation (NSTEMI) myocardial infarction: Secondary | ICD-10-CM | POA: Diagnosis not present

## 2021-06-16 LAB — CBC
HCT: 43.9 % (ref 39.0–52.0)
HCT: 44.3 % (ref 39.0–52.0)
Hemoglobin: 14.9 g/dL (ref 13.0–17.0)
Hemoglobin: 15.6 g/dL (ref 13.0–17.0)
MCH: 29.2 pg (ref 26.0–34.0)
MCH: 29.8 pg (ref 26.0–34.0)
MCHC: 33.9 g/dL (ref 30.0–36.0)
MCHC: 35.2 g/dL (ref 30.0–36.0)
MCV: 86.1 fL (ref 80.0–100.0)
MCV: 84.7 fL (ref 80.0–100.0)
Platelets: 243 10*3/uL (ref 150–400)
Platelets: 246 10*3/uL (ref 150–400)
RBC: 5.1 MIL/uL (ref 4.22–5.81)
RBC: 5.23 MIL/uL (ref 4.22–5.81)
RDW: 12.7 % (ref 11.5–15.5)
RDW: 12.6 % (ref 11.5–15.5)
WBC: 9.8 10*3/uL (ref 4.0–10.5)
WBC: 9.7 10*3/uL (ref 4.0–10.5)
nRBC: 0 % (ref 0.0–0.2)
nRBC: 0 % (ref 0.0–0.2)

## 2021-06-16 LAB — BASIC METABOLIC PANEL
Anion gap: 9 (ref 5–15)
BUN: 16 mg/dL (ref 6–20)
CO2: 23 mmol/L (ref 22–32)
Calcium: 9.2 mg/dL (ref 8.9–10.3)
Chloride: 105 mmol/L (ref 98–111)
Creatinine, Ser: 1.16 mg/dL (ref 0.61–1.24)
GFR, Estimated: >60 mL/min (ref >60)
Glucose, Bld: 93 mg/dL (ref 70–99)
Potassium: 3.7 mmol/L (ref 3.5–5.1)
Sodium: 137 mmol/L (ref 135–145)

## 2021-06-16 LAB — GLUCOSE, CAPILLARY
Glucose-Capillary: 92 mg/dL (ref 70–99)
Glucose-Capillary: 120 mg/dL — ABNORMAL HIGH (ref 70–99)
Glucose-Capillary: 109 mg/dL — ABNORMAL HIGH (ref 70–99)
Glucose-Capillary: 142 mg/dL — ABNORMAL HIGH (ref 70–99)

## 2021-06-16 LAB — HEPARIN LEVEL (UNFRACTIONATED): Heparin Unfractionated: 0.43 IU/mL (ref 0.30–0.70)

## 2021-06-16 NOTE — Progress Notes (Signed)
CARDIAC REHAB PHASE I   Preop education completed with pt and wife. Pt educated on importance of IS use, sternal precautions, and ambulation postop. Pt given in-the-tube sheet along with cardiac surgery booklet and [facility] care guide. Pt denies further questions or concerns at this time. Will continue to follow.  [id number] Reynold Bowen, RN BSN 06/16/2021 2:31 PM

## 2021-06-16 NOTE — Plan of Care (Signed)
  Problem: Education: Goal: Knowledge of General Education information will improve Description: Including pain rating scale, medication(s)/side effects and non-pharmacologic comfort measures Outcome: Progressing   Problem: Health Behavior/Discharge Planning: Goal: Ability to manage health-related needs will improve Outcome: Progressing   Problem: Nutrition: Goal: Adequate nutrition will be maintained Outcome: Progressing   Problem: Activity: Goal: Risk for activity intolerance will decrease Outcome: Progressing   

## 2021-06-16 NOTE — Progress Notes (Signed)
Progress Note  Patient Name: Matthew Mann Date of Encounter: 06/16/2021  [facility] Cardiologist: O'Neal  Subjective   Chest pain last night relieved with IV NTG. No chest pain this am.   Inpatient Medications    Scheduled Meds:  amLODipine  10 mg Oral Daily   aspirin EC  81 mg Oral Daily   atorvastatin  80 mg Oral Daily   dapagliflozin propanediol  10 mg Oral Daily   glimepiride  4 mg Oral Q breakfast   insulin aspart  0-15 Units Subcutaneous TID WC   losartan  100 mg Oral Daily   metoprolol tartrate  25 mg Oral BID   sodium chloride flush  3 mL Intravenous Q12H   Continuous Infusions:  sodium chloride     heparin 1,200 Units/hr (06/16/21 0636)   nitroGLYCERIN 5 mcg/min (06/15/21 1804)   PRN Meds: sodium chloride, acetaminophen, nitroGLYCERIN, ondansetron (ZOFRAN) IV, sodium chloride flush   Vital Signs    Vitals:   06/15/21 2341 06/16/21 0306 06/16/21 0628 06/16/21 0718  BP: 130/84 131/79  (!) 154/89  Pulse: 70 66  78  Resp: 20 15  19   Temp: 98.4 F (36.9 C) 97.8 F (36.6 C)  97.8 F (36.6 C)  TempSrc: Oral Oral  Oral  SpO2: 98% 96%  96%  Weight:   75.9 kg   Height:        Intake/Output Summary (Last 24 hours) at 06/16/2021 0734 Last data filed at 06/16/2021 06/18/2021 Gross per 24 hour  Intake 331.97 ml  Output 3 ml  Net 328.97 ml   Last 3 Weights 06/16/2021 06/15/2021 06/14/2021  Weight (lbs) 167 lb 5.3 oz 168 lb 10.4 oz 174 lb  Weight (kg) 75.9 kg 76.5 kg 78.926 kg      Telemetry    Sinus- Personally Reviewed  ECG    Sinus, possible inferior Q waves - Personally Reviewed  Physical Exam   General: Well developed, well nourished, NAD  HEENT: OP clear, mucus membranes moist  SKIN: warm, dry. No rashes. Neuro: No focal deficits  Musculoskeletal: Muscle strength 5/5 all ext  Psychiatric: Mood and affect normal  Neck: No JVD, no carotid bruits, no thyromegaly, no lymphadenopathy.  Lungs:Clear bilaterally, no wheezes, rhonci,  crackles Cardiovascular: Regular rate and rhythm. No murmurs, gallops or rubs. Abdomen:Soft. Bowel sounds present. Non-tender.  Extremities: No lower extremity edema. Pulses are 2 + in the bilateral DP/PT.  Labs    High Sensitivity Troponin:   Recent Labs  Lab 06/14/21 0954 06/14/21 1139 06/14/21 1652 06/14/21 1825  TROPONINIHS 278* 275* 285* 265*     Chemistry Recent Labs  Lab 06/14/21 0954 06/15/21 0621 06/16/21 0440  NA 137 137 137  K 3.8 4.2 3.7  CL 104 104 105  CO2 24 21* 23  GLUCOSE 213* 102* 93  BUN 17 13 16   CREATININE 1.11 0.94 1.16  CALCIUM 9.3 9.3 9.2  GFRNONAA >60 >60 >60  ANIONGAP 9 12 9     Lipids  Recent Labs  Lab 06/15/21 0621  CHOL 126  TRIG 88  HDL 35*  LDLCALC 73  CHOLHDL 3.6    Hematology Recent Labs  Lab 06/15/21 0621 06/16/21 0040 06/16/21 0440  WBC 9.8 9.8 9.7  RBC 5.46 5.10 5.23  HGB 16.1 14.9 15.6  HCT 47.4 43.9 44.3  MCV 86.8 86.1 84.7  MCH 29.5 29.2 29.8  MCHC 34.0 33.9 35.2  RDW 12.8 12.7 12.6  PLT 233 243 246   Thyroid No results for input(s): TSH, FREET4  in the last 168 hours.  BNPNo results for input(s): BNP, PROBNP in the last 168 hours.  DDimer  Recent Labs  Lab 06/14/21 0954  DDIMER <0.27     Radiology    CARDIAC CATHETERIZATION  Result Date: 06/14/2021 1.  Left dominant coronary system with patent left main, severe diffuse mid LAD stenosis, severe mid circumflex stenosis, severe left PDA stenosis, severe diagonal stenosis. 2.  Nondominant RCA with moderate diffuse plaquing 3.  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  We will resume heparin and consult cardiac surgery for consideration of CABG.  His targets are suboptimal and if he is not felt to have suitable anatomy for CABG, he could be treated with targeted PCI and ongoing medical therapy.  If his targets are felt to be suitable for grafting, he  would be more completely revascularized with CABG.   DG Chest Port 1 View  Result Date: 06/14/2021 CLINICAL DATA:  Left-sided chest pain EXAM: PORTABLE CHEST 1 VIEW COMPARISON:  None. FINDINGS: The heart size and mediastinal contours are within normal limits. No focal airspace consolidation, pleural effusion, or pneumothorax. The visualized skeletal structures are unremarkable. IMPRESSION: No active disease. Electronically Signed   By: Duanne Guess D.O.   On: 06/14/2021 10:34   CT Angio Chest Aorta W and/or Wo Contrast  Result Date: 06/14/2021 CLINICAL DATA:  Left-sided chest pain radiating to the left arm since this past Friday. Evaluate for thoracic aortic aneurysm. EXAM: CT ANGIOGRAPHY CHEST WITH CONTRAST TECHNIQUE: Multidetector CT imaging of the chest was performed using the standard protocol during bolus administration of intravenous contrast. Multiplanar CT image reconstructions and MIPs were obtained to evaluate the vascular anatomy. CONTRAST:  59mL OMNIPAQUE IOHEXOL 350 MG/ML SOLN COMPARISON:  None. FINDINGS: Vascular Findings: No evidence of thoracic aortic aneurysm with measurements as follows. There is a very minimal amount of eccentric noncalcified atherosclerotic plaque involving the proximal aspect of the descending thoracic aorta, not resulting in a hemodynamically significant stenosis. No evidence of thoracic aortic dissection or periaortic stranding on this nongated examination. Conventional configuration of the aortic arch. The branch vessels of the aortic arch appear widely patent throughout their imaged courses. Normal heart size.  No pericardial effusion. Although this examination was not tailored for the evaluation the pulmonary arteries, there are no discrete filling defects within the central pulmonary arterial tree to suggest central pulmonary embolism. Normal caliber of the main pulmonary artery. ------------------------------------------------------------- Thoracic aortic  measurements: SINOTUBULAR JUNCTION: 27 mm as measured in greatest oblique short axis coronal dimension. PROXIMAL ASCENDING THORACIC AORTA: 28 mm as measured in greatest oblique short axis axial dimension at the level of the main pulmonary artery in approximately 29 mm in greatest oblique short axis coronal diameter (coronal image 58, series 8) AORTIC ARCH: 22 mm as measured in greatest oblique short axis sagittal dimension. PROXIMAL DESCENDING THORACIC AORTA: 24 mm as measured in greatest oblique short axis axial dimension at the level of the main pulmonary artery. DISTAL DESCENDING THORACIC AORTA: 21 mm as measured in greatest oblique short axis axial dimension at the level of the diaphragmatic hiatus. Review of the MIP images confirms the above findings. ------------------------------------------------------------- Non-Vascular Findings: Mediastinum/Lymph Nodes: Calcified mediastinal lymph nodes, the sequela of previous granulomatous infection. No bulky mediastinal, hilar or axillary lymphadenopathy. Lungs/Pleura: No focal airspace opacities. No pleural effusion or pneumothorax. The central pulmonary airways appear widely patent. Punctate (2 mm) calcified granuloma involving the medial  basilar aspect the right lower lobe (image 107, series 7) as well as the left lower lobe (image 98, series 7). There is a punctate (approximately 4 mm) noncalcified nodule within the subpleural aspect the left lower lobe (image 129, series 7). Upper abdomen: Limited early arterial phase evaluation of the upper abdomen demonstrates diffuse decreased attenuation of the hepatic parenchyma suggestive of hepatic steatosis. Punctate granuloma are seen within the spleen. Musculoskeletal: No acute or aggressive osseous abnormalities. Stigmata of dish within the thoracic spine. Regional soft tissues appear normal. IMPRESSION: 1. No acute cardiopulmonary disease. Specifically, no evidence of thoracic aortic aneurysm or dissection. 2. Minimal  amount of atherosclerotic plaque within the thoracic aorta, not resulting in a hemodynamically significant stenosis. Aortic Atherosclerosis (ICD10-I70.0). 3. Sequela of previous granulomatous infection as above. 4. Punctate (4 mm) noncalcified left lower lobe pulmonary nodule. Correlates to previous outside examinations (if available), is advised. Otherwise, no follow-up needed if patient is low-risk. Non-contrast chest CT can be considered in 12 months if patient is high-risk. This recommendation follows the consensus statement: Guidelines for Management of Incidental Pulmonary Nodules Detected on CT Images: From the Fleischner Society 2017; Radiology 2017; 284:228-243. 5. Suspected hepatic steatosis.  Correlation with LFTs is advised. Electronically Signed   By: Simonne Come M.D.   On: 06/14/2021 14:17   ECHOCARDIOGRAM COMPLETE  Result Date: 06/15/2021    ECHOCARDIOGRAM REPORT   Patient Name:   Matthew Mann Date of Exam: 06/15/2021 Medical Rec #:  [id number]   Height:       67.0 in Accession #:    [id number]  Weight:       168.7 lb Date of Birth:  1966-04-29    BSA:          1.881 m Patient Age:    55 years    BP:           143/91 mmHg Patient Gender: M           HR:           73 bpm. Exam Location:  Inpatient Procedure: 2D Echo, Cardiac Doppler and Color Doppler Indications:    NSTEMI I21.4  History:        Patient has no prior history of Echocardiogram examinations.                 CAD, Cath 06/14/21; Risk Factors:Hypertension, Diabetes and                 Dyslipidemia.  Sonographer:    Roosvelt Maser RDCS Referring Phys: [id number] Jaiyden Laur RONALD BERGE IMPRESSIONS  1. There appears to be fusion of left and right coronary cusps with functionally bicuspid aortic valve; trace AI; mild AS (mean gradient 11 mmHg).  2. Left ventricular ejection fraction, by estimation, is 60 to 65%. The left ventricle has normal function. The left ventricle has no regional wall motion abnormalities. Left ventricular diastolic parameters  are consistent with Grade I diastolic dysfunction (impaired relaxation).  3. Right ventricular systolic function is normal. The right ventricular size is normal. Tricuspid regurgitation signal is inadequate for assessing PA pressure.  4. The mitral valve is normal in structure. Trivial mitral valve regurgitation. No evidence of mitral stenosis.  5. The aortic valve is bicuspid. Aortic valve regurgitation is trivial. Mild aortic valve stenosis.  6. The inferior vena cava is normal in size with greater than 50% respiratory variability, suggesting right atrial pressure of 3 mmHg. FINDINGS  Left Ventricle: Left ventricular ejection fraction, by estimation, is 60 to  65%. The left ventricle has normal function. The left ventricle has no regional wall motion abnormalities. The left ventricular internal cavity size was normal in size. There is  no left ventricular hypertrophy. Left ventricular diastolic parameters are consistent with Grade I diastolic dysfunction (impaired relaxation). Right Ventricle: The right ventricular size is normal. Right ventricular systolic function is normal. Tricuspid regurgitation signal is inadequate for assessing PA pressure. The tricuspid regurgitant velocity is 1.84 m/s, and with an assumed right atrial  pressure of 3 mmHg, the estimated right ventricular systolic pressure is 16.5 mmHg. Left Atrium: Left atrial size was normal in size. Right Atrium: Right atrial size was normal in size. Pericardium: There is no evidence of pericardial effusion. Mitral Valve: The mitral valve is normal in structure. Trivial mitral valve regurgitation. No evidence of mitral valve stenosis. Tricuspid Valve: The tricuspid valve is normal in structure. Tricuspid valve regurgitation is trivial. No evidence of tricuspid stenosis. Aortic Valve: The aortic valve is bicuspid. Aortic valve regurgitation is trivial. Mild aortic stenosis is present. Aortic valve mean gradient measures 11.0 mmHg. Aortic valve peak  gradient measures 19.5 mmHg. Aortic valve area, by VTI measures 1.22 cm. Pulmonic Valve: The pulmonic valve was normal in structure. Pulmonic valve regurgitation is not visualized. No evidence of pulmonic stenosis. Aorta: The aortic root is normal in size and structure. Venous: The inferior vena cava is normal in size with greater than 50% respiratory variability, suggesting right atrial pressure of 3 mmHg. IAS/Shunts: The interatrial septum was not well visualized. Additional Comments: There appears to be fusion of left and right coronary cusps with functionally bicuspid aortic valve; trace AI; mild AS (mean gradient 11 mmHg).  LEFT VENTRICLE PLAX 2D LVIDd:         4.60 cm   Diastology LVIDs:         3.30 cm   LV e' medial:    7.95 cm/s LV PW:         0.80 cm   LV E/e' medial:  10.5 LV IVS:        0.90 cm   LV e' lateral:   14.60 cm/s LVOT diam:     2.00 cm   LV E/e' lateral: 5.7 LV SV:         52 LV SV Index:   28 LVOT Area:     3.14 cm  RIGHT VENTRICLE RV Basal diam:  3.30 cm LEFT ATRIUM             Index        RIGHT ATRIUM           Index LA diam:        2.90 cm 1.54 cm/m   RA Area:     13.40 cm LA Vol (A2C):   26.4 ml 14.04 ml/m  RA Volume:   31.30 ml  16.64 ml/m LA Vol (A4C):   25.3 ml 13.45 ml/m LA Biplane Vol: 28.0 ml 14.89 ml/m  AORTIC VALVE AV Area (Vmax):    1.18 cm AV Area (Vmean):   1.17 cm AV Area (VTI):     1.22 cm AV Vmax:           221.00 cm/s AV Vmean:          151.000 cm/s AV VTI:            0.427 m AV Peak Grad:      19.5 mmHg AV Mean Grad:      11.0 mmHg LVOT Vmax:  83.20 cm/s LVOT Vmean:        56.200 cm/s LVOT VTI:          0.166 m LVOT/AV VTI ratio: 0.39  AORTA Ao Root diam: 3.50 cm Ao Asc diam:  2.90 cm MITRAL VALVE                TRICUSPID VALVE MV Area (PHT): 3.02 cm     TR Peak grad:   13.5 mmHg MV Decel Time: 251 msec     TR Vmax:        184.00 cm/s MV E velocity: 83.20 cm/s MV A velocity: 122.00 cm/s  SHUNTS MV E/A ratio:  0.68         Systemic VTI:  0.17 m                              Systemic Diam: 2.00 cm Olga Millers MD Electronically signed by Olga Millers MD Signature Date/Time: 06/15/2021/11:52:44 AM    Final     Cardiac Studies     Patient Profile     55 y.o. male with DM, HTN, HLD admitted 06/14/21 with a NSTEMI. 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   Assessment & Plan    NSTEMI: Cardiac cath with severe two vessel CAD. Echo with normal LV systolic function and mild AS with possible bicuspid aortic valve. CT surgery consulted for CABG. CABG planned for early next week. He has no chest pain today. Will continue ASA, statin, beta blocker and IV heparin while awaiting bypass.   HTN: BP is well controlled. No changes Diabetes mellitus: SSI.   For questions or updates, please contact [facility] Please consult [email or web address] for contact info under     Signed, Verne Carrow, MD  06/16/2021, 7:34 AM

## 2021-06-16 NOTE — Progress Notes (Signed)
Pre-CABG study has been completed.  Results can be found under chart review under CV PROC. 06/16/2021 11:48 AM Mika Griffitts RVT, RDMS

## 2021-06-16 NOTE — Progress Notes (Signed)
ANTICOAGULATION CONSULT NOTE  Pharmacy Consult for heparin Indication: chest pain/ACS  Allergies  Allergen Reactions   Ace Inhibitors Swelling and Other (See Comments)    Tongue swelling and cough.     Patient Measurements: Height: 5\' 7"  (170.2 cm) Weight: 75.9 kg (167 lb 5.3 oz) IBW/kg (Calculated) : 66.1 HEPARIN DW (KG): 78.9   Vital Signs: Temp: 97.8 F (36.6 C) (10/14 0718) Temp Source: Oral (10/14 0718) BP: 154/89 (10/14 0718) Pulse Rate: 78 (10/14 0718)  Labs: Recent Labs    06/14/21 0954 06/14/21 0954 06/14/21 1139 06/14/21 1652 06/14/21 1825 06/15/21 0621 06/15/21 1246 06/15/21 2038 06/16/21 0040 06/16/21 0440  HGB 16.6  --   --   --   --  16.1  --   --  14.9 15.6  HCT 48.5  --   --   --   --  47.4  --   --  43.9 44.3  PLT 267  --   --   --   --  233  --   --  243 246  HEPARINUNFRC  --    < >  --   --   --  0.56 0.19* 0.38 0.43  --   CREATININE 1.11  --   --   --   --  0.94  --   --   --  1.16  TROPONINIHS 278*  --  275* 285* 265*  --   --   --   --   --    < > = values in this interval not displayed.     Estimated Creatinine Clearance: 68.9 mL/min (by C-G formula based on SCr of 1.16 mg/dL).  Assessment: Patient presented with chest pain that radiates into left arm and jaw, and pharmacy asked to dose heparin.  Cath reveals severe multivessel CAD and TCTS consulted for possible CABG (possible procedure on 10/17)  Heparin level of 0.43 is therapeutic on heparin 1200 units/hr.   Goal of Therapy:  Heparin level 0.3-0.7 units/ml Monitor platelets by anticoagulation protocol: Yes   Plan Continue heparin at 1200 units/hr  Daily heparin level and CBC  11/17, PharmD Clinical Pharmacist **Pharmacist phone directory can now be found on [email or web address] ([email or web address]).  Listed under [facility].

## 2021-06-17 DIAGNOSIS — I214 Non-ST elevation (NSTEMI) myocardial infarction: Secondary | ICD-10-CM | POA: Diagnosis not present

## 2021-06-17 LAB — CBC
HCT: 42.7 % (ref 39.0–52.0)
Hemoglobin: 15 g/dL (ref 13.0–17.0)
MCH: 29.9 pg (ref 26.0–34.0)
MCHC: 35.1 g/dL (ref 30.0–36.0)
MCV: 85.2 fL (ref 80.0–100.0)
Platelets: 227 10*3/uL (ref 150–400)
RBC: 5.01 MIL/uL (ref 4.22–5.81)
RDW: 12.8 % (ref 11.5–15.5)
WBC: 10.6 10*3/uL — ABNORMAL HIGH (ref 4.0–10.5)
nRBC: 0 % (ref 0.0–0.2)

## 2021-06-17 LAB — BASIC METABOLIC PANEL
Anion gap: 13 (ref 5–15)
BUN: 22 mg/dL — ABNORMAL HIGH (ref 6–20)
CO2: 19 mmol/L — ABNORMAL LOW (ref 22–32)
Calcium: 9 mg/dL (ref 8.9–10.3)
Chloride: 107 mmol/L (ref 98–111)
Creatinine, Ser: 1.05 mg/dL (ref 0.61–1.24)
GFR, Estimated: >60 mL/min (ref >60)
Glucose, Bld: 101 mg/dL — ABNORMAL HIGH (ref 70–99)
Potassium: 3.3 mmol/L — ABNORMAL LOW (ref 3.5–5.1)
Sodium: 139 mmol/L (ref 135–145)

## 2021-06-17 LAB — GLUCOSE, CAPILLARY
Glucose-Capillary: 99 mg/dL (ref 70–99)
Glucose-Capillary: 184 mg/dL — ABNORMAL HIGH (ref 70–99)
Glucose-Capillary: 108 mg/dL — ABNORMAL HIGH (ref 70–99)
Glucose-Capillary: 131 mg/dL — ABNORMAL HIGH (ref 70–99)

## 2021-06-17 LAB — HEPARIN LEVEL (UNFRACTIONATED): Heparin Unfractionated: 0.58 IU/mL (ref 0.30–0.70)

## 2021-06-17 MED ORDER — POTASSIUM CHLORIDE CRYS ER 20 MEQ PO TBCR
40.0000 meq | EXTENDED_RELEASE_TABLET | Freq: Once | ORAL | Status: AC
  Administered 2021-06-17: 40 meq via ORAL
  Filled 2021-06-17: qty 2

## 2021-06-17 NOTE — Progress Notes (Signed)
ANTICOAGULATION CONSULT NOTE  Pharmacy Consult for heparin Indication: chest pain/ACS  Allergies  Allergen Reactions   Ace Inhibitors Swelling and Other (See Comments)    Tongue swelling and cough.     Patient Measurements: Height: 5\' 7"  (170.2 cm) Weight: 75.3 kg (166 lb) IBW/kg (Calculated) : 66.1 HEPARIN DW (KG): 78.9   Vital Signs: Temp: 97.9 F (36.6 C) (10/15 0403) Temp Source: Oral (10/15 0403) BP: 121/80 (10/15 0403) Pulse Rate: 66 (10/15 0403)  Labs: Recent Labs    06/14/21 0954 06/14/21 1139 06/14/21 1652 06/14/21 1825 06/15/21 0621 06/15/21 1246 06/15/21 2038 06/16/21 0040 06/16/21 0440 06/17/21 0116  HGB 16.6  --   --   --  16.1  --   --  14.9 15.6 15.0  HCT 48.5  --   --   --  47.4  --   --  43.9 44.3 42.7  PLT 267  --   --   --  233  --   --  243 246 227  HEPARINUNFRC  --   --   --   --  0.56   < > 0.38 0.43  --  0.58  CREATININE 1.11  --   --   --  0.94  --   --   --  1.16  --   TROPONINIHS 278* 275* 285* 265*  --   --   --   --   --   --    < > = values in this interval not displayed.     Estimated Creatinine Clearance: 68.9 mL/min (by C-G formula based on SCr of 1.16 mg/dL).  Assessment: Patient presented with chest pain that radiates into left arm and jaw, and pharmacy asked to dose heparin.  Cath reveals severe multivessel CAD and TCTS consulted for possible CABG (possible procedure on 10/17)  Heparin level of 0.58 is therapeutic on heparin 1200 units/hr. CBC is stable.  Goal of Therapy:  Heparin level 0.3-0.7 units/ml Monitor platelets by anticoagulation protocol: Yes   Plan Continue heparin at 1200 units/hr  Daily heparin level and CBC  11/17, PharmD PGY2 Cardiology Pharmacy Resident Phone: [phone] 06/17/2021  7:15 AM  Please check [email or web address] for unit-specific pharmacy phone numbers.

## 2021-06-17 NOTE — Progress Notes (Signed)
Progress Note  Patient Name: Matthew Mann Date of Encounter: 06/17/2021  [facility] Cardiologist: O'Neal  Subjective   No CP after starting nitro IV. Feeling well with family, wife and daughter at bedside. No questions.  Inpatient Medications    Scheduled Meds:  amLODipine  10 mg Oral Daily   aspirin EC  81 mg Oral Daily   atorvastatin  80 mg Oral Daily   dapagliflozin propanediol  10 mg Oral Daily   glimepiride  4 mg Oral Q breakfast   insulin aspart  0-15 Units Subcutaneous TID WC   losartan  100 mg Oral Daily   metoprolol tartrate  25 mg Oral BID   sodium chloride flush  3 mL Intravenous Q12H   Continuous Infusions:  sodium chloride     heparin 1,200 Units/hr (06/17/21 0342)   nitroGLYCERIN 5 mcg/min (06/15/21 1804)   PRN Meds: sodium chloride, acetaminophen, nitroGLYCERIN, ondansetron (ZOFRAN) IV, sodium chloride flush   Vital Signs    Vitals:   06/16/21 2325 06/17/21 0403 06/17/21 0622 06/17/21 0734  BP: 126/80 121/80  130/89  Pulse: 64 66  73  Resp: 16 15  13   Temp: 97.7 F (36.5 C) 97.9 F (36.6 C)  97.8 F (36.6 C)  TempSrc: Oral Oral  Oral  SpO2: 98% 94%  97%  Weight:   75.3 kg   Height:        Intake/Output Summary (Last 24 hours) at 06/17/2021 1014 Last data filed at 06/17/2021 0800 Gross per 24 hour  Intake 203 ml  Output --  Net 203 ml   Last 3 Weights 06/17/2021 06/16/2021 06/15/2021  Weight (lbs) 166 lb 167 lb 5.3 oz 168 lb 10.4 oz  Weight (kg) 75.297 kg 75.9 kg 76.5 kg      Telemetry    Sinus- Personally Reviewed  ECG    No new - Personally Reviewed  Physical Exam   GEN: No acute distress.   Neck: No JVD Cardiac: RRR, no murmurs, rubs, or gallops.  Respiratory: Clear to auscultation bilaterally. GI: Soft, nontender, non-distended  MS: No edema; No deformity. Neuro:  Nonfocal  Psych: Normal affect    Labs    High Sensitivity Troponin:   Recent Labs  Lab 06/14/21 0954 06/14/21 1139 06/14/21 1652  06/14/21 1825  TROPONINIHS 278* 275* 285* 265*     Chemistry Recent Labs  Lab 06/15/21 0621 06/16/21 0440 06/17/21 0800  NA 137 137 139  K 4.2 3.7 3.3*  CL 104 105 107  CO2 21* 23 19*  GLUCOSE 102* 93 101*  BUN 13 16 22*  CREATININE 0.94 1.16 1.05  CALCIUM 9.3 9.2 9.0  GFRNONAA >60 >60 >60  ANIONGAP 12 9 13     Lipids  Recent Labs  Lab 06/15/21 0621  CHOL 126  TRIG 88  HDL 35*  LDLCALC 73  CHOLHDL 3.6    Hematology Recent Labs  Lab 06/16/21 0040 06/16/21 0440 06/17/21 0116  WBC 9.8 9.7 10.6*  RBC 5.10 5.23 5.01  HGB 14.9 15.6 15.0  HCT 43.9 44.3 42.7  MCV 86.1 84.7 85.2  MCH 29.2 29.8 29.9  MCHC 33.9 35.2 35.1  RDW 12.7 12.6 12.8  PLT 243 246 227   Thyroid No results for input(s): TSH, FREET4 in the last 168 hours.  BNPNo results for input(s): BNP, PROBNP in the last 168 hours.  DDimer  Recent Labs  Lab 06/14/21 0954  DDIMER <0.27     Radiology    VAS 06/19/21 DOPPLER PRE CABG  Result  Date: 06/16/2021 PREOPERATIVE VASCULAR EVALUATION Patient Name:  Matthew Mann  Date of Exam:   06/16/2021 Medical Rec #: [id number]    Accession #:    [id number] Date of Birth: [birth year]     Patient Gender: M Patient Age:   62 years Exam Location:  [facility] Procedure:      VAS US DOPPLER PRE CABG Referring Phys: HARRELL LIGHTFOOT --------------------------------------------------------------------------------  Indications:      Pre-CABG. Risk Factors:     Hypertension, hyperlipidemia, Diabetes, no history of smoking,                   prior MI. Comparison Study: No previous exams Performing Technologist: Hill, Jody RVT, RDMS  Examination Guidelines: A complete evaluation includes B-mode imaging, spectral Doppler, color Doppler, and power Doppler as needed of all accessible portions of each vessel. Bilateral testing is considered an integral part of a complete examination. Limited examinations for reoccurring indications may be performed as noted.  Right Carotid Findings:  +----------+--------+--------+--------+--------+--------+           PSV cm/sEDV cm/sStenosisDescribeComments +----------+--------+--------+--------+--------+--------+ CCA Prox  97      20                               +----------+--------+--------+--------+--------+--------+ CCA Distal76      21                               +----------+--------+--------+--------+--------+--------+ ICA Prox  58      23                               +----------+--------+--------+--------+--------+--------+ ICA Distal52      21                               +----------+--------+--------+--------+--------+--------+ ECA       111     14                               +----------+--------+--------+--------+--------+--------+ +----------+--------+-------+--------+------------+           PSV cm/sEDV cmsDescribeArm Pressure +----------+--------+-------+--------+------------+ Subclavian122                                 +----------+--------+-------+--------+------------+ +---------+--------+--+--------+-+ VertebralPSV cm/s31EDV cm/s7 +---------+--------+--+--------+-+ Left Carotid Findings: +----------+--------+--------+--------+--------+------------------+           PSV cm/sEDV cm/sStenosisDescribeComments           +----------+--------+--------+--------+--------+------------------+ CCA Prox  105     20                      intimal thickening +----------+--------+--------+--------+--------+------------------+ CCA Distal67      15                      intimal thickening +----------+--------+--------+--------+--------+------------------+ ICA Prox  45      17                                         +----------+--------+--------+--------+--------+------------------+ ICA Distal47      18                                         +----------+--------+--------+--------+--------+------------------+  ECA       101     17                                          +----------+--------+--------+--------+--------+------------------+ +----------+--------+--------+--------+------------+ SubclavianPSV cm/sEDV cm/sDescribeArm Pressure +----------+--------+--------+--------+------------+           102                                  +----------+--------+--------+--------+------------+ +---------+--------+--+--------+--+ VertebralPSV cm/s36EDV cm/s10 +---------+--------+--+--------+--+  ABI Findings: +---------+------------------+-----+---------+----------+ Right    Rt Pressure (mmHg)IndexWaveform Comment    +---------+------------------+-----+---------+----------+ Brachial 140                    triphasic           +---------+------------------+-----+---------+----------+ PTA      169               1.17 biphasic            +---------+------------------+-----+---------+----------+ DP       159               1.10 biphasic            +---------+------------------+-----+---------+----------+ Great Toe94                0.65 Normal   borderline +---------+------------------+-----+---------+----------+ +---------+------------------+-----+---------+-------+ Left     Lt Pressure (mmHg)IndexWaveform Comment +---------+------------------+-----+---------+-------+ Brachial 145                    triphasic        +---------+------------------+-----+---------+-------+ PTA      181               1.25 biphasic         +---------+------------------+-----+---------+-------+ DP       154               1.06 biphasic         +---------+------------------+-----+---------+-------+ Great Toe111               0.77 Normal           +---------+------------------+-----+---------+-------+ +-------+---------------+----------------+ ABI/TBIToday's ABI/TBIPrevious ABI/TBI +-------+---------------+----------------+ Right  1.17 / 0.65                     +-------+---------------+----------------+ Left   1.25 / 0.77                      +-------+---------------+----------------+  Right Doppler Findings: +--------+--------+-----+---------+--------+ Site    PressureIndexDoppler  Comments +--------+--------+-----+---------+--------+ [id number]          triphasic         +--------+--------+-----+---------+--------+ Radial               triphasic         +--------+--------+-----+---------+--------+ Ulnar                triphasic         +--------+--------+-----+---------+--------+  Left Doppler Findings: +--------+--------+-----+---------+--------+ Site    PressureIndexDoppler  Comments +--------+--------+-----+---------+--------+ [id number]          triphasic         +--------+--------+-----+---------+--------+ Radial               triphasic         +--------+--------+-----+---------+--------+ Ulnar  triphasic         +--------+--------+-----+---------+--------+  Summary: Right Carotid: The extracranial vessels were near-normal with only minimal wall                thickening or plaque. Left Carotid: The extracranial vessels were near-normal with only minimal wall               thickening or plaque. Vertebrals:  Bilateral vertebral arteries demonstrate antegrade flow. Subclavians: Normal flow hemodynamics were seen in bilateral subclavian              arteries. Right ABI: Resting right ankle-brachial index is within normal range. No evidence of significant right lower extremity arterial disease. The right toe-brachial index is normal. Left ABI: Resting left ankle-brachial index is within normal range. No evidence of significant left lower extremity arterial disease. The left toe-brachial index is normal. Bilateral Extremity: Doppler waveforms remain within normal limits with compression bilaterally for the radial arteries. Doppler waveforms remain within normal limits with compression bilaterally for the ulnar arteries.  Electronically signed by Sherald Hess MD on  06/16/2021 at 2:32:02 PM.    Final     Cardiac Studies   1. Left dominant coronary system with patent left main, severe diffuse mid LAD stenosis, severe mid circumflex stenosis, severe left PDA stenosis, severe diagonal stenosis. 2. Nondominant RCA with moderate diffuse plaquing 3. 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 We will resume heparin and consult cardiac surgery for consideration of CABG. His targets are suboptimal and if he is not felt to have suitable anatomy for CABG, he could be treated with targeted PCI and ongoing medical therapy. If his targets are felt to be suitable for grafting, he would be more completely revascularized with CABG.   Patient Profile     55 y.o. male with DM, HTN, HLD admitted 06/14/21 with a NSTEMI. 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   Assessment & Plan    NSTEMI: Cardiac cath with severe two vessel CAD. Echo with normal LV systolic function and mild AS with possible bicuspid aortic valve (images independently reviewed, appears possible calcified raphe between L and R coronary cusps). CT surgery consulted for CABG. CABG planned for Tuesday. He has no chest pain today. Will continue ASA, statin, beta blocker and IV heparin and IV nitro while awaiting bypass.   HTN: BP is well controlled. No changes Diabetes mellitus: SSI.   For questions or updates, please contact [facility] Please consult [email or web address] for contact info under     Signed, Parke Poisson, MD  06/17/2021, 10:14 AM

## 2021-06-17 NOTE — Progress Notes (Signed)
CARDIAC REHAB PHASE I   Pt given IS, able to get 2500 with ease. Pt states surgery date has been moved, expresses some disappointment, but understands reasoning. Provided support and encouragement. Encouraged continued OOB and IS use as long as asymptomatic. Will continue to follow.  [id number] Reynold Bowen, RN BSN 06/17/2021 12:05 PM

## 2021-06-18 DIAGNOSIS — I214 Non-ST elevation (NSTEMI) myocardial infarction: Secondary | ICD-10-CM | POA: Diagnosis not present

## 2021-06-18 DIAGNOSIS — I25110 Atherosclerotic heart disease of native coronary artery with unstable angina pectoris: Secondary | ICD-10-CM

## 2021-06-18 LAB — PREPARE RBC (CROSSMATCH)

## 2021-06-18 LAB — BASIC METABOLIC PANEL
Anion gap: 7 (ref 5–15)
BUN: 20 mg/dL (ref 6–20)
CO2: 21 mmol/L — ABNORMAL LOW (ref 22–32)
Calcium: 9.1 mg/dL (ref 8.9–10.3)
Chloride: 110 mmol/L (ref 98–111)
Creatinine, Ser: 1.04 mg/dL (ref 0.61–1.24)
GFR, Estimated: >60 mL/min (ref >60)
Glucose, Bld: 101 mg/dL — ABNORMAL HIGH (ref 70–99)
Potassium: 4 mmol/L (ref 3.5–5.1)
Sodium: 138 mmol/L (ref 135–145)

## 2021-06-18 LAB — GLUCOSE, CAPILLARY
Glucose-Capillary: 95 mg/dL (ref 70–99)
Glucose-Capillary: 102 mg/dL — ABNORMAL HIGH (ref 70–99)
Glucose-Capillary: 116 mg/dL — ABNORMAL HIGH (ref 70–99)
Glucose-Capillary: 106 mg/dL — ABNORMAL HIGH (ref 70–99)

## 2021-06-18 LAB — HEPARIN LEVEL (UNFRACTIONATED): Heparin Unfractionated: 0.62 IU/mL (ref 0.30–0.70)

## 2021-06-18 LAB — CBC
HCT: 43.6 % (ref 39.0–52.0)
Hemoglobin: 15 g/dL (ref 13.0–17.0)
MCH: 29.6 pg (ref 26.0–34.0)
MCHC: 34.4 g/dL (ref 30.0–36.0)
MCV: 86.2 fL (ref 80.0–100.0)
Platelets: 214 10*3/uL (ref 150–400)
RBC: 5.06 MIL/uL (ref 4.22–5.81)
RDW: 12.9 % (ref 11.5–15.5)
WBC: 11.6 10*3/uL — ABNORMAL HIGH (ref 4.0–10.5)
nRBC: 0 % (ref 0.0–0.2)

## 2021-06-18 LAB — ABO/RH: ABO/RH(D): O NEG

## 2021-06-18 LAB — MAGNESIUM: Magnesium: 2.3 mg/dL (ref 1.7–2.4)

## 2021-06-18 MED ORDER — PHENYLEPHRINE HCL-NACL 20-0.9 MG/250ML-% IV SOLN
30.0000 ug/min | INTRAVENOUS | Status: AC
  Administered 2021-06-19: 20 ug/min via INTRAVENOUS
  Filled 2021-06-18: qty 250

## 2021-06-18 MED ORDER — TRANEXAMIC ACID 1000 MG/10ML IV SOLN
1.5000 mg/kg/h | INTRAVENOUS | Status: AC
  Administered 2021-06-19: 1.5 mg/kg/h via INTRAVENOUS
  Filled 2021-06-18: qty 25

## 2021-06-18 MED ORDER — HEPARIN 30,000 UNITS/1000 ML (OHS) CELLSAVER SOLUTION
Status: DC
  Filled 2021-06-18: qty 1000

## 2021-06-18 MED ORDER — MILRINONE LACTATE IN DEXTROSE 20-5 MG/100ML-% IV SOLN
0.3000 ug/kg/min | INTRAVENOUS | Status: DC
  Filled 2021-06-18: qty 100

## 2021-06-18 MED ORDER — EPINEPHRINE HCL 5 MG/250ML IV SOLN IN NS
0.0000 ug/min | INTRAVENOUS | Status: DC
  Filled 2021-06-18: qty 250

## 2021-06-18 MED ORDER — CHLORHEXIDINE GLUCONATE CLOTH 2 % EX PADS: 6.0000 | MEDICATED_PAD | Freq: Once | CUTANEOUS | Status: DC

## 2021-06-18 MED ORDER — NITROGLYCERIN IN D5W 200-5 MCG/ML-% IV SOLN
2.0000 ug/min | INTRAVENOUS | Status: AC
Start: 2021-06-19 — End: 2021-06-19
  Administered 2021-06-19: 5 ug/min via INTRAVENOUS
  Filled 2021-06-18: qty 250

## 2021-06-18 MED ORDER — POTASSIUM CHLORIDE 2 MEQ/ML IV SOLN
80.0000 meq | INTRAVENOUS | Status: DC
  Filled 2021-06-18: qty 40

## 2021-06-18 MED ORDER — METOPROLOL TARTRATE 12.5 MG HALF TABLET: 12.5000 mg | ORAL_TABLET | Freq: Once | ORAL | Status: DC

## 2021-06-18 MED ORDER — INSULIN REGULAR(HUMAN) IN NACL 100-0.9 UT/100ML-% IV SOLN
INTRAVENOUS | Status: AC
  Administered 2021-06-19: 2.4 [IU]/h via INTRAVENOUS
  Filled 2021-06-18 (×2): qty 100

## 2021-06-18 MED ORDER — TRANEXAMIC ACID (OHS) BOLUS VIA INFUSION
15.0000 mg/kg | INTRAVENOUS | Status: AC
  Administered 2021-06-19: 1134 mg via INTRAVENOUS
  Filled 2021-06-18: qty 1134

## 2021-06-18 MED ORDER — CEFAZOLIN SODIUM-DEXTROSE 2-4 GM/100ML-% IV SOLN
2.0000 g | INTRAVENOUS | Status: AC
  Administered 2021-06-19 (×2): 2 g via INTRAVENOUS
  Filled 2021-06-18: qty 100

## 2021-06-18 MED ORDER — VANCOMYCIN HCL 1250 MG/250ML IV SOLN
1250.0000 mg | INTRAVENOUS | Status: AC
  Administered 2021-06-19: 1250 mg via INTRAVENOUS
  Filled 2021-06-18: qty 250

## 2021-06-18 MED ORDER — CHLORHEXIDINE GLUCONATE 0.12 % MT SOLN: 15.0000 mL | Freq: Once | OROMUCOSAL | Status: DC

## 2021-06-18 MED ORDER — NOREPINEPHRINE 4 MG/250ML-% IV SOLN
0.0000 ug/min | INTRAVENOUS | Status: DC
  Filled 2021-06-18: qty 250

## 2021-06-18 MED ORDER — BISACODYL 5 MG PO TBEC
5.0000 mg | DELAYED_RELEASE_TABLET | Freq: Once | ORAL | Status: AC
  Administered 2021-06-18: 5 mg via ORAL
  Filled 2021-06-18: qty 1

## 2021-06-18 MED ORDER — PLASMA-LYTE A IV SOLN
INTRAVENOUS | Status: AC
  Administered 2021-06-19: 1000 mL
  Filled 2021-06-18: qty 5

## 2021-06-18 MED ORDER — MANNITOL 20 % IV SOLN
INTRAVENOUS | Status: DC
  Filled 2021-06-18: qty 13

## 2021-06-18 MED ORDER — CHLORHEXIDINE GLUCONATE CLOTH 2 % EX PADS
6.0000 | MEDICATED_PAD | Freq: Once | CUTANEOUS | Status: AC
  Administered 2021-06-18: 6 via TOPICAL

## 2021-06-18 MED ORDER — DEXMEDETOMIDINE HCL IN NACL 400 MCG/100ML IV SOLN
0.1000 ug/kg/h | INTRAVENOUS | Status: AC
  Administered 2021-06-19: .2 ug/kg/h via INTRAVENOUS
  Filled 2021-06-18: qty 100

## 2021-06-18 MED ORDER — TRANEXAMIC ACID (OHS) PUMP PRIME SOLUTION
2.0000 mg/kg | INTRAVENOUS | Status: DC
  Filled 2021-06-18: qty 1.51

## 2021-06-18 MED ORDER — TEMAZEPAM 15 MG PO CAPS: 15.0000 mg | ORAL_CAPSULE | Freq: Once | ORAL | Status: DC | PRN

## 2021-06-18 MED ORDER — CEFAZOLIN SODIUM-DEXTROSE 2-4 GM/100ML-% IV SOLN
2.0000 g | INTRAVENOUS | Status: DC
  Filled 2021-06-18: qty 100

## 2021-06-18 NOTE — Anesthesia Preprocedure Evaluation (Addendum)
Anesthesia Evaluation  Patient identified by MRN, date of birth, ID band Patient awake    Reviewed: Allergy & Precautions, NPO status , Patient's Chart, lab work & pertinent test results  Airway Mallampati: III  TM Distance: >3 FB Neck ROM: Full    Dental no notable dental hx.    Pulmonary neg pulmonary ROS,    Pulmonary exam normal breath sounds clear to auscultation       Cardiovascular hypertension, Pt. on medications + CAD and + Past MI  Normal cardiovascular exam Rhythm:Regular Rate:Normal     Neuro/Psych negative neurological ROS  negative psych ROS   GI/Hepatic negative GI ROS, Neg liver ROS,   Endo/Other  diabetes, Oral Hypoglycemic Agents  Renal/GU negative Renal ROS     Musculoskeletal negative musculoskeletal ROS (+)   Abdominal   Peds  Hematology  (+) anemia , HLD   Anesthesia Other Findings CAD  Reproductive/Obstetrics                            Anesthesia Physical Anesthesia Plan  ASA: 4  Anesthesia Plan: General   Post-op Pain Management:    Induction: Intravenous  PONV Risk Score and Plan: 2 and Ondansetron, Dexamethasone, Midazolam and Treatment may vary due to age or medical condition  Airway Management Planned: Oral ETT  Additional Equipment: Arterial line, CVP, TEE and Ultrasound Guidance Line Placement  Intra-op Plan:   Post-operative Plan: Post-operative intubation/ventilation  Informed Consent: I have reviewed the patients History and Physical, chart, labs and discussed the procedure including the risks, benefits and alternatives for the proposed anesthesia with the patient or authorized representative who has indicated his/her understanding and acceptance.     Dental advisory given  Plan Discussed with: CRNA  Anesthesia Plan Comments:         Anesthesia Quick Evaluation

## 2021-06-18 NOTE — Progress Notes (Signed)
     [address]       [place] [address]             [phone]        Patient has been on nitroglycerin drip.  His pain is well controlled.  Vitals:   06/18/21 0334 06/18/21 0745  BP: 107/81 112/77  Pulse: 75 83  Resp: 12 16  Temp: 98.4 F (36.9 C) 97.7 F (36.5 C)  SpO2: 94% 96%   Alert NAD Sinus EWOB  55 yo male with sever 3V CAD On nitro gtt OR tomorrow for CABG with L radial artery harvest  Maybree Riling O Sun Wilensky

## 2021-06-18 NOTE — Progress Notes (Signed)
Progress Note  Patient Name: Wm Sahagun Date of Encounter: 06/18/2021  [facility] Cardiologist: O'Neal  Subjective   CP free while on nitro gtt.  Inpatient Medications    Scheduled Meds:  amLODipine  10 mg Oral Daily   aspirin EC  81 mg Oral Daily   atorvastatin  80 mg Oral Daily   dapagliflozin propanediol  10 mg Oral Daily   glimepiride  4 mg Oral Q breakfast   insulin aspart  0-15 Units Subcutaneous TID WC   losartan  100 mg Oral Daily   metoprolol tartrate  25 mg Oral BID   sodium chloride flush  3 mL Intravenous Q12H   Continuous Infusions:  sodium chloride     heparin 1,200 Units/hr (06/18/21 0000)   nitroGLYCERIN 5 mcg/min (06/18/21 0000)   PRN Meds: sodium chloride, acetaminophen, nitroGLYCERIN, ondansetron (ZOFRAN) IV, sodium chloride flush   Vital Signs    Vitals:   06/17/21 2323 06/18/21 0334 06/18/21 0513 06/18/21 0745  BP: 129/84 107/81  112/77  Pulse: 65 75  83  Resp: 14 12  16   Temp: 98.4 F (36.9 C) 98.4 F (36.9 C)  97.7 F (36.5 C)  TempSrc: Oral Oral  Oral  SpO2: 93% 94%  96%  Weight:   75.6 kg   Height:        Intake/Output Summary (Last 24 hours) at 06/18/2021 1125 Last data filed at 06/18/2021 0000 Gross per 24 hour  Intake 751.94 ml  Output --  Net 751.94 ml   Last 3 Weights 06/18/2021 06/17/2021 06/16/2021  Weight (lbs) 166 lb 10.7 oz 166 lb 167 lb 5.3 oz  Weight (kg) 75.6 kg 75.297 kg 75.9 kg      Telemetry    Sinus- Personally Reviewed  ECG    No new - Personally Reviewed  Physical Exam   GEN: No acute distress.   Neck: No JVD Cardiac: RRR, no murmurs, rubs, or gallops.  Respiratory: Clear to auscultation bilaterally. GI: Soft, nontender, non-distended  MS: No edema; No deformity. Neuro:  Nonfocal  Psych: Normal affect    Labs    High Sensitivity Troponin:   Recent Labs  Lab 06/14/21 0954 06/14/21 1139 06/14/21 1652 06/14/21 1825  TROPONINIHS 278* 275* 285* 265*     Chemistry Recent Labs   Lab 06/16/21 0440 06/17/21 0800 06/18/21 0127  NA 137 139 138  K 3.7 3.3* 4.0  CL 105 107 110  CO2 23 19* 21*  GLUCOSE 93 101* 101*  BUN 16 22* 20  CREATININE 1.16 1.05 1.04  CALCIUM 9.2 9.0 9.1  MG  --   --  2.3  GFRNONAA >60 >60 >60  ANIONGAP 9 13 7     Lipids  Recent Labs  Lab 06/15/21 0621  CHOL 126  TRIG 88  HDL 35*  LDLCALC 73  CHOLHDL 3.6    Hematology Recent Labs  Lab 06/16/21 0440 06/17/21 0116 06/18/21 0127  WBC 9.7 10.6* 11.6*  RBC 5.23 5.01 5.06  HGB 15.6 15.0 15.0  HCT 44.3 42.7 43.6  MCV 84.7 85.2 86.2  MCH 29.8 29.9 29.6  MCHC 35.2 35.1 34.4  RDW 12.6 12.8 12.9  PLT 246 227 214   Thyroid No results for input(s): TSH, FREET4 in the last 168 hours.  BNPNo results for input(s): BNP, PROBNP in the last 168 hours.  DDimer  Recent Labs  Lab 06/14/21 0954  DDIMER <0.27     Radiology    No results found.  Cardiac Studies   1.  Left dominant coronary system with patent left main, severe diffuse mid LAD stenosis, severe mid circumflex stenosis, severe left PDA stenosis, severe diagonal stenosis. 2. Nondominant RCA with moderate diffuse plaquing 3. 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 We will resume heparin and consult cardiac surgery for consideration of CABG. His targets are suboptimal and if he is not felt to have suitable anatomy for CABG, he could be treated with targeted PCI and ongoing medical therapy. If his targets are felt to be suitable for grafting, he would be more completely revascularized with CABG.   Patient Profile     55 y.o. male with DM, HTN, HLD admitted 06/14/21 with a NSTEMI. 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   Assessment & Plan    NSTEMI: Cardiac cath with severe two vessel CAD. Echo with normal LV systolic function and mild AS with possible bicuspid aortic valve (images independently reviewed, appears  possible calcified raphe between L and R coronary cusps). CT surgery consulted for CABG. CABG planned for tomorrow given need for IV nitro. Discussed with Dr. Cliffton Asters. He has no chest pain today while on IV nitro. Will continue ASA, statin, beta blocker and IV heparin and IV nitro while awaiting bypass.   HTN: BP is well controlled. No changes Diabetes mellitus: SSI.   For questions or updates, please contact [facility] Please consult [email or web address] for contact info under     Signed, Parke Poisson, MD  06/18/2021, 11:25 AM

## 2021-06-18 NOTE — Progress Notes (Signed)
ANTICOAGULATION CONSULT NOTE  Pharmacy Consult for heparin Indication: chest pain/ACS  Allergies  Allergen Reactions   Ace Inhibitors Swelling and Other (See Comments)    Tongue swelling and cough.     Patient Measurements: Height: 5\' 7"  (170.2 cm) Weight: 75.6 kg (166 lb 10.7 oz) IBW/kg (Calculated) : 66.1 HEPARIN DW (KG): 78.9   Vital Signs: Temp: 97.7 F (36.5 C) (10/16 0745) Temp Source: Oral (10/16 0745) BP: 112/77 (10/16 0745) Pulse Rate: 83 (10/16 0745)  Labs: Recent Labs    06/16/21 0040 06/16/21 0440 06/17/21 0116 06/17/21 0800 06/18/21 0127  HGB 14.9 15.6 15.0  --  15.0  HCT 43.9 44.3 42.7  --  43.6  PLT 243 246 227  --  214  HEPARINUNFRC 0.43  --  0.58  --  0.62  CREATININE  --  1.16  --  1.05 1.04     Estimated Creatinine Clearance: 76.8 mL/min (by C-G formula based on SCr of 1.04 mg/dL).  Assessment: Patient presented with chest pain that radiates into left arm and jaw, and pharmacy asked to dose heparin.  Cath reveals severe multivessel CAD and TCTS consulted for possible CABG (possible procedure on 10/17)  Heparin level of 0.62 is therapeutic on heparin 1200 units/hr. CBC is stable.  Goal of Therapy:  Heparin level 0.3-0.7 units/ml Monitor platelets by anticoagulation protocol: Yes   Plan Continue heparin at 1200 units/hr  Daily heparin level and CBC  11/17, PharmD PGY2 Cardiology Pharmacy Resident Phone: [phone] 06/18/2021  8:51 AM  Please check [email or web address] for unit-specific pharmacy phone numbers.

## 2021-06-19 ENCOUNTER — Encounter (HOSPITAL_COMMUNITY): Payer: Self-pay | Admitting: Cardiovascular Disease

## 2021-06-19 ENCOUNTER — Inpatient Hospital Stay (HOSPITAL_COMMUNITY)
Admission: EM | Disposition: A | Discharge status: Home / Self Care | Payer: Self-pay | Source: Home / Self Care | Attending: Cardiovascular Disease

## 2021-06-19 ENCOUNTER — Inpatient Hospital Stay (HOSPITAL_COMMUNITY): Payer: 59

## 2021-06-19 DIAGNOSIS — I10 Essential (primary) hypertension: Secondary | ICD-10-CM | POA: Diagnosis not present

## 2021-06-19 DIAGNOSIS — I214 Non-ST elevation (NSTEMI) myocardial infarction: Secondary | ICD-10-CM | POA: Diagnosis not present

## 2021-06-19 DIAGNOSIS — Z951 Presence of aortocoronary bypass graft: Secondary | ICD-10-CM

## 2021-06-19 DIAGNOSIS — E119 Type 2 diabetes mellitus without complications: Secondary | ICD-10-CM | POA: Diagnosis not present

## 2021-06-19 DIAGNOSIS — I2510 Atherosclerotic heart disease of native coronary artery without angina pectoris: Secondary | ICD-10-CM

## 2021-06-19 HISTORY — PX: RADIAL ARTERY HARVEST: SHX5067

## 2021-06-19 HISTORY — PX: TEE WITHOUT CARDIOVERSION: SHX5443

## 2021-06-19 HISTORY — PX: ENDOVEIN HARVEST OF GREATER SAPHENOUS VEIN: SHX5059

## 2021-06-19 HISTORY — PX: CORONARY ARTERY BYPASS GRAFT: SHX141

## 2021-06-19 LAB — CBC
HCT: 42.6 % (ref 39.0–52.0)
HCT: 34.5 % — ABNORMAL LOW (ref 39.0–52.0)
HCT: 37.9 % — ABNORMAL LOW (ref 39.0–52.0)
Hemoglobin: 14.6 g/dL (ref 13.0–17.0)
Hemoglobin: 11.6 g/dL — ABNORMAL LOW (ref 13.0–17.0)
Hemoglobin: 12.8 g/dL — ABNORMAL LOW (ref 13.0–17.0)
MCH: 29.7 pg (ref 26.0–34.0)
MCH: 29.2 pg (ref 26.0–34.0)
MCH: 29.7 pg (ref 26.0–34.0)
MCHC: 34.3 g/dL (ref 30.0–36.0)
MCHC: 33.6 g/dL (ref 30.0–36.0)
MCHC: 33.8 g/dL (ref 30.0–36.0)
MCV: 86.6 fL (ref 80.0–100.0)
MCV: 86.9 fL (ref 80.0–100.0)
MCV: 87.9 fL (ref 80.0–100.0)
Platelets: 209 10*3/uL (ref 150–400)
Platelets: 164 10*3/uL (ref 150–400)
Platelets: 276 10*3/uL (ref 150–400)
RBC: 4.92 MIL/uL (ref 4.22–5.81)
RBC: 3.97 MIL/uL — ABNORMAL LOW (ref 4.22–5.81)
RBC: 4.31 MIL/uL (ref 4.22–5.81)
RDW: 12.8 % (ref 11.5–15.5)
RDW: 12.8 % (ref 11.5–15.5)
RDW: 13.1 % (ref 11.5–15.5)
WBC: 12.3 10*3/uL — ABNORMAL HIGH (ref 4.0–10.5)
WBC: 15.9 10*3/uL — ABNORMAL HIGH (ref 4.0–10.5)
WBC: 24.5 10*3/uL — ABNORMAL HIGH (ref 4.0–10.5)
nRBC: 0 % (ref 0.0–0.2)
nRBC: 0 % (ref 0.0–0.2)
nRBC: 0 % (ref 0.0–0.2)

## 2021-06-19 LAB — ECHO INTRAOPERATIVE TEE
Height: 67 in
Weight: 2638.47 oz

## 2021-06-19 LAB — POCT I-STAT 7, (LYTES, BLD GAS, ICA,H+H)
Acid-base deficit: 3 mmol/L — ABNORMAL HIGH (ref 0.0–2.0)
Acid-base deficit: 1 mmol/L (ref 0.0–2.0)
Acid-base deficit: 3 mmol/L — ABNORMAL HIGH (ref 0.0–2.0)
Acid-base deficit: 5 mmol/L — ABNORMAL HIGH (ref 0.0–2.0)
Acid-base deficit: 2 mmol/L (ref 0.0–2.0)
Acid-base deficit: 3 mmol/L — ABNORMAL HIGH (ref 0.0–2.0)
Acid-base deficit: 5 mmol/L — ABNORMAL HIGH (ref 0.0–2.0)
Acid-base deficit: 10 mmol/L — ABNORMAL HIGH (ref 0.0–2.0)
Bicarbonate: 22.6 mmol/L (ref 20.0–28.0)
Bicarbonate: 20.1 mmol/L (ref 20.0–28.0)
Bicarbonate: 22.6 mmol/L (ref 20.0–28.0)
Bicarbonate: 23.1 mmol/L (ref 20.0–28.0)
Bicarbonate: 23.5 mmol/L (ref 20.0–28.0)
Bicarbonate: 22.7 mmol/L (ref 20.0–28.0)
Bicarbonate: 19.4 mmol/L — ABNORMAL LOW (ref 20.0–28.0)
Bicarbonate: 15.1 mmol/L — ABNORMAL LOW (ref 20.0–28.0)
Calcium, Ion: 1.01 mmol/L — ABNORMAL LOW (ref 1.15–1.40)
Calcium, Ion: 1 mmol/L — ABNORMAL LOW (ref 1.15–1.40)
Calcium, Ion: 1.02 mmol/L — ABNORMAL LOW (ref 1.15–1.40)
Calcium, Ion: 1.06 mmol/L — ABNORMAL LOW (ref 1.15–1.40)
Calcium, Ion: 1.37 mmol/L (ref 1.15–1.40)
Calcium, Ion: 1.25 mmol/L (ref 1.15–1.40)
Calcium, Ion: 1.16 mmol/L (ref 1.15–1.40)
Calcium, Ion: 1 mmol/L — ABNORMAL LOW (ref 1.15–1.40)
HCT: 26 % — ABNORMAL LOW (ref 39.0–52.0)
HCT: 27 % — ABNORMAL LOW (ref 39.0–52.0)
HCT: 27 % — ABNORMAL LOW (ref 39.0–52.0)
HCT: 28 % — ABNORMAL LOW (ref 39.0–52.0)
HCT: 27 % — ABNORMAL LOW (ref 39.0–52.0)
HCT: 33 % — ABNORMAL LOW (ref 39.0–52.0)
HCT: 34 % — ABNORMAL LOW (ref 39.0–52.0)
HCT: 27 % — ABNORMAL LOW (ref 39.0–52.0)
Hemoglobin: 8.8 g/dL — ABNORMAL LOW (ref 13.0–17.0)
Hemoglobin: 9.2 g/dL — ABNORMAL LOW (ref 13.0–17.0)
Hemoglobin: 9.2 g/dL — ABNORMAL LOW (ref 13.0–17.0)
Hemoglobin: 9.5 g/dL — ABNORMAL LOW (ref 13.0–17.0)
Hemoglobin: 9.2 g/dL — ABNORMAL LOW (ref 13.0–17.0)
Hemoglobin: 11.2 g/dL — ABNORMAL LOW (ref 13.0–17.0)
Hemoglobin: 11.6 g/dL — ABNORMAL LOW (ref 13.0–17.0)
Hemoglobin: 9.2 g/dL — ABNORMAL LOW (ref 13.0–17.0)
O2 Saturation: 100 %
O2 Saturation: 100 %
O2 Saturation: 100 %
O2 Saturation: 100 %
O2 Saturation: 93 %
O2 Saturation: 90 %
O2 Saturation: 95 %
O2 Saturation: 95 %
Patient temperature: 36.5
Patient temperature: 36.6
Patient temperature: 37
Potassium: 5.5 mmol/L — ABNORMAL HIGH (ref 3.5–5.1)
Potassium: 4.3 mmol/L (ref 3.5–5.1)
Potassium: 4.8 mmol/L (ref 3.5–5.1)
Potassium: 5.1 mmol/L (ref 3.5–5.1)
Potassium: 4.1 mmol/L (ref 3.5–5.1)
Potassium: 4 mmol/L (ref 3.5–5.1)
Potassium: 4.2 mmol/L (ref 3.5–5.1)
Potassium: 3.2 mmol/L — ABNORMAL LOW (ref 3.5–5.1)
Sodium: 137 mmol/L (ref 135–145)
Sodium: 138 mmol/L (ref 135–145)
Sodium: 139 mmol/L (ref 135–145)
Sodium: 139 mmol/L (ref 135–145)
Sodium: 141 mmol/L (ref 135–145)
Sodium: 141 mmol/L (ref 135–145)
Sodium: 140 mmol/L (ref 135–145)
Sodium: 144 mmol/L (ref 135–145)
TCO2: 24 mmol/L (ref 22–32)
TCO2: 21 mmol/L — ABNORMAL LOW (ref 22–32)
TCO2: 24 mmol/L (ref 22–32)
TCO2: 24 mmol/L (ref 22–32)
TCO2: 25 mmol/L (ref 22–32)
TCO2: 24 mmol/L (ref 22–32)
TCO2: 20 mmol/L — ABNORMAL LOW (ref 22–32)
TCO2: 16 mmol/L — ABNORMAL LOW (ref 22–32)
pCO2 arterial: 43.3 mmHg (ref 32.0–48.0)
pCO2 arterial: 35.1 mmHg (ref 32.0–48.0)
pCO2 arterial: 36.9 mmHg (ref 32.0–48.0)
pCO2 arterial: 39.7 mmHg (ref 32.0–48.0)
pCO2 arterial: 44.1 mmHg (ref 32.0–48.0)
pCO2 arterial: 39.4 mmHg (ref 32.0–48.0)
pCO2 arterial: 32 mmHg (ref 32.0–48.0)
pCO2 arterial: 30.1 mmHg — ABNORMAL LOW (ref 32.0–48.0)
pH, Arterial: 7.325 — ABNORMAL LOW (ref 7.350–7.450)
pH, Arterial: 7.344 — ABNORMAL LOW (ref 7.350–7.450)
pH, Arterial: 7.364 (ref 7.350–7.450)
pH, Arterial: 7.427 (ref 7.350–7.450)
pH, Arterial: 7.334 — ABNORMAL LOW (ref 7.350–7.450)
pH, Arterial: 7.366 (ref 7.350–7.450)
pH, Arterial: 7.388 (ref 7.350–7.450)
pH, Arterial: 7.31 — ABNORMAL LOW (ref 7.350–7.450)
pO2, Arterial: 359 mmHg — ABNORMAL HIGH (ref 83.0–108.0)
pO2, Arterial: 208 mmHg — ABNORMAL HIGH (ref 83.0–108.0)
pO2, Arterial: 260 mmHg — ABNORMAL HIGH (ref 83.0–108.0)
pO2, Arterial: 342 mmHg — ABNORMAL HIGH (ref 83.0–108.0)
pO2, Arterial: 73 mmHg — ABNORMAL LOW (ref 83.0–108.0)
pO2, Arterial: 58 mmHg — ABNORMAL LOW (ref 83.0–108.0)
pO2, Arterial: 72 mmHg — ABNORMAL LOW (ref 83.0–108.0)
pO2, Arterial: 81 mmHg — ABNORMAL LOW (ref 83.0–108.0)

## 2021-06-19 LAB — BLOOD GAS, ARTERIAL
Acid-base deficit: 4.8 mmol/L — ABNORMAL HIGH (ref 0.0–2.0)
Bicarbonate: 19.3 mmol/L — ABNORMAL LOW (ref 20.0–28.0)
FIO2: 21
O2 Saturation: 96.6 %
Patient temperature: 37
pCO2 arterial: 32.2 mmHg (ref 32.0–48.0)
pH, Arterial: 7.394 (ref 7.350–7.450)
pO2, Arterial: 86.6 mmHg (ref 83.0–108.0)

## 2021-06-19 LAB — POCT I-STAT, CHEM 8
BUN: 20 mg/dL (ref 6–20)
BUN: 20 mg/dL (ref 6–20)
BUN: 17 mg/dL (ref 6–20)
BUN: 17 mg/dL (ref 6–20)
BUN: 17 mg/dL (ref 6–20)
BUN: 18 mg/dL (ref 6–20)
Calcium, Ion: 1.26 mmol/L (ref 1.15–1.40)
Calcium, Ion: 1.25 mmol/L (ref 1.15–1.40)
Calcium, Ion: 1.03 mmol/L — ABNORMAL LOW (ref 1.15–1.40)
Calcium, Ion: 1.02 mmol/L — ABNORMAL LOW (ref 1.15–1.40)
Calcium, Ion: 1.06 mmol/L — ABNORMAL LOW (ref 1.15–1.40)
Calcium, Ion: 1.35 mmol/L (ref 1.15–1.40)
Chloride: 107 mmol/L (ref 98–111)
Chloride: 107 mmol/L (ref 98–111)
Chloride: 104 mmol/L (ref 98–111)
Chloride: 106 mmol/L (ref 98–111)
Chloride: 104 mmol/L (ref 98–111)
Chloride: 106 mmol/L (ref 98–111)
Creatinine, Ser: 0.8 mg/dL (ref 0.61–1.24)
Creatinine, Ser: 0.8 mg/dL (ref 0.61–1.24)
Creatinine, Ser: 0.7 mg/dL (ref 0.61–1.24)
Creatinine, Ser: 0.7 mg/dL (ref 0.61–1.24)
Creatinine, Ser: 0.7 mg/dL (ref 0.61–1.24)
Creatinine, Ser: 0.7 mg/dL (ref 0.61–1.24)
Glucose, Bld: 124 mg/dL — ABNORMAL HIGH (ref 70–99)
Glucose, Bld: 123 mg/dL — ABNORMAL HIGH (ref 70–99)
Glucose, Bld: 100 mg/dL — ABNORMAL HIGH (ref 70–99)
Glucose, Bld: 104 mg/dL — ABNORMAL HIGH (ref 70–99)
Glucose, Bld: 110 mg/dL — ABNORMAL HIGH (ref 70–99)
Glucose, Bld: 124 mg/dL — ABNORMAL HIGH (ref 70–99)
HCT: 42 % (ref 39.0–52.0)
HCT: 36 % — ABNORMAL LOW (ref 39.0–52.0)
HCT: 28 % — ABNORMAL LOW (ref 39.0–52.0)
HCT: 26 % — ABNORMAL LOW (ref 39.0–52.0)
HCT: 29 % — ABNORMAL LOW (ref 39.0–52.0)
HCT: 30 % — ABNORMAL LOW (ref 39.0–52.0)
Hemoglobin: 14.3 g/dL (ref 13.0–17.0)
Hemoglobin: 12.2 g/dL — ABNORMAL LOW (ref 13.0–17.0)
Hemoglobin: 9.5 g/dL — ABNORMAL LOW (ref 13.0–17.0)
Hemoglobin: 8.8 g/dL — ABNORMAL LOW (ref 13.0–17.0)
Hemoglobin: 10.2 g/dL — ABNORMAL LOW (ref 13.0–17.0)
Hemoglobin: 9.9 g/dL — ABNORMAL LOW (ref 13.0–17.0)
Potassium: 3.9 mmol/L (ref 3.5–5.1)
Potassium: 4.1 mmol/L (ref 3.5–5.1)
Potassium: 4.5 mmol/L (ref 3.5–5.1)
Potassium: 4.8 mmol/L (ref 3.5–5.1)
Potassium: 4.1 mmol/L (ref 3.5–5.1)
Potassium: 4.8 mmol/L (ref 3.5–5.1)
Sodium: 139 mmol/L (ref 135–145)
Sodium: 138 mmol/L (ref 135–145)
Sodium: 137 mmol/L (ref 135–145)
Sodium: 139 mmol/L (ref 135–145)
Sodium: 139 mmol/L (ref 135–145)
Sodium: 140 mmol/L (ref 135–145)
TCO2: 22 mmol/L (ref 22–32)
TCO2: 23 mmol/L (ref 22–32)
TCO2: 23 mmol/L (ref 22–32)
TCO2: 23 mmol/L (ref 22–32)
TCO2: 22 mmol/L (ref 22–32)
TCO2: 25 mmol/L (ref 22–32)

## 2021-06-19 LAB — PROTIME-INR
INR: 1 (ref 0.8–1.2)
INR: 1.3 — ABNORMAL HIGH (ref 0.8–1.2)
Prothrombin Time: 13.3 seconds (ref 11.4–15.2)
Prothrombin Time: 15.7 seconds — ABNORMAL HIGH (ref 11.4–15.2)

## 2021-06-19 LAB — BASIC METABOLIC PANEL
Anion gap: 10 (ref 5–15)
Anion gap: 9 (ref 5–15)
BUN: 23 mg/dL — ABNORMAL HIGH (ref 6–20)
BUN: 18 mg/dL (ref 6–20)
CO2: 18 mmol/L — ABNORMAL LOW (ref 22–32)
CO2: 19 mmol/L — ABNORMAL LOW (ref 22–32)
Calcium: 8.9 mg/dL (ref 8.9–10.3)
Calcium: 8.1 mg/dL — ABNORMAL LOW (ref 8.9–10.3)
Chloride: 109 mmol/L (ref 98–111)
Chloride: 106 mmol/L (ref 98–111)
Creatinine, Ser: 1 mg/dL (ref 0.61–1.24)
Creatinine, Ser: 1.07 mg/dL (ref 0.61–1.24)
GFR, Estimated: >60 mL/min (ref >60)
GFR, Estimated: >60 mL/min (ref >60)
Glucose, Bld: 116 mg/dL — ABNORMAL HIGH (ref 70–99)
Glucose, Bld: 124 mg/dL — ABNORMAL HIGH (ref 70–99)
Potassium: 3.5 mmol/L (ref 3.5–5.1)
Potassium: 4.7 mmol/L (ref 3.5–5.1)
Sodium: 137 mmol/L (ref 135–145)
Sodium: 134 mmol/L — ABNORMAL LOW (ref 135–145)

## 2021-06-19 LAB — GLUCOSE, CAPILLARY
Glucose-Capillary: 104 mg/dL — ABNORMAL HIGH (ref 70–99)
Glucose-Capillary: 114 mg/dL — ABNORMAL HIGH (ref 70–99)
Glucose-Capillary: 117 mg/dL — ABNORMAL HIGH (ref 70–99)
Glucose-Capillary: 122 mg/dL — ABNORMAL HIGH (ref 70–99)
Glucose-Capillary: 129 mg/dL — ABNORMAL HIGH (ref 70–99)
Glucose-Capillary: 130 mg/dL — ABNORMAL HIGH (ref 70–99)
Glucose-Capillary: 51 mg/dL — ABNORMAL LOW (ref 70–99)
Glucose-Capillary: 71 mg/dL (ref 70–99)
Glucose-Capillary: 97 mg/dL (ref 70–99)
Glucose-Capillary: 98 mg/dL (ref 70–99)

## 2021-06-19 LAB — POCT I-STAT EG7
Acid-base deficit: 5 mmol/L — ABNORMAL HIGH (ref 0.0–2.0)
Bicarbonate: 21.9 mmol/L (ref 20.0–28.0)
Calcium, Ion: 0.97 mmol/L — ABNORMAL LOW (ref 1.15–1.40)
HCT: 26 % — ABNORMAL LOW (ref 39.0–52.0)
Hemoglobin: 8.8 g/dL — ABNORMAL LOW (ref 13.0–17.0)
O2 Saturation: 69 %
Potassium: 4.5 mmol/L (ref 3.5–5.1)
Sodium: 138 mmol/L (ref 135–145)
TCO2: 23 mmol/L (ref 22–32)
pCO2, Ven: 47.8 mmHg (ref 44.0–60.0)
pH, Ven: 7.27 (ref 7.250–7.430)
pO2, Ven: 41 mmHg (ref 32.0–45.0)

## 2021-06-19 LAB — APTT
aPTT: 75 seconds — ABNORMAL HIGH (ref 24–36)
aPTT: 29 seconds (ref 24–36)

## 2021-06-19 LAB — URINALYSIS, ROUTINE W REFLEX MICROSCOPIC
Bacteria, UA: NONE SEEN
Bilirubin Urine: NEGATIVE
Glucose, UA: >=500 mg/dL — AB
Hgb urine dipstick: NEGATIVE
Ketones, ur: 20 mg/dL — AB
Leukocytes,Ua: NEGATIVE
Nitrite: NEGATIVE
Protein, ur: NEGATIVE mg/dL
Specific Gravity, Urine: 1.015 (ref 1.005–1.030)
pH: 5 (ref 5.0–8.0)

## 2021-06-19 LAB — MAGNESIUM
Magnesium: 2.2 mg/dL (ref 1.7–2.4)
Magnesium: 3 mg/dL — ABNORMAL HIGH (ref 1.7–2.4)

## 2021-06-19 LAB — HEPARIN LEVEL (UNFRACTIONATED): Heparin Unfractionated: 0.41 IU/mL (ref 0.30–0.70)

## 2021-06-19 LAB — HEMOGLOBIN AND HEMATOCRIT, BLOOD
HCT: 27.2 % — ABNORMAL LOW (ref 39.0–52.0)
Hemoglobin: 9.5 g/dL — ABNORMAL LOW (ref 13.0–17.0)

## 2021-06-19 LAB — PLATELET COUNT: Platelets: 144 10*3/uL — ABNORMAL LOW (ref 150–400)

## 2021-06-19 LAB — HEMOGLOBIN A1C
Hgb A1c MFr Bld: 8.4 % — ABNORMAL HIGH (ref 4.8–5.6)
Mean Plasma Glucose: 194.38 mg/dL

## 2021-06-19 SURGERY — CORONARY ARTERY BYPASS GRAFTING (CABG)
Anesthesia: General | Site: Leg Lower | Laterality: Right

## 2021-06-19 MED ORDER — LACTATED RINGERS IV SOLN
INTRAVENOUS | Status: DC | PRN
Start: 2021-06-19 — End: 2021-06-19

## 2021-06-19 MED ORDER — BISACODYL 5 MG PO TBEC
10.0000 mg | DELAYED_RELEASE_TABLET | Freq: Every day | ORAL | Status: DC
  Administered 2021-06-20 – 2021-06-22 (×3): 10 mg via ORAL
  Filled 2021-06-19 (×4): qty 2

## 2021-06-19 MED ORDER — DOBUTAMINE IN D5W 4-5 MG/ML-% IV SOLN: 0.0000 ug/kg/min | INTRAVENOUS | Status: DC

## 2021-06-19 MED ORDER — LIDOCAINE HCL (CARDIAC) PF 100 MG/5ML IV SOSY
PREFILLED_SYRINGE | INTRAVENOUS | Status: DC | PRN
  Administered 2021-06-19: 60 mg via INTRAVENOUS

## 2021-06-19 MED ORDER — MAGNESIUM SULFATE 4 GM/100ML IV SOLN
4.0000 g | Freq: Once | INTRAVENOUS | Status: AC
  Administered 2021-06-19: 4 g via INTRAVENOUS
  Filled 2021-06-19: qty 100

## 2021-06-19 MED ORDER — DEXTROSE 50 % IV SOLN: 0.0000 mL | INTRAVENOUS | Status: DC | PRN

## 2021-06-19 MED ORDER — PROPOFOL 10 MG/ML IV BOLUS
INTRAVENOUS | Status: AC
  Filled 2021-06-19: qty 40

## 2021-06-19 MED ORDER — MORPHINE SULFATE (PF) 2 MG/ML IV SOLN
1.0000 mg | INTRAVENOUS | Status: DC | PRN
  Administered 2021-06-19 – 2021-06-21 (×6): 2 mg via INTRAVENOUS
  Filled 2021-06-19 (×7): qty 1

## 2021-06-19 MED ORDER — LACTATED RINGERS IV SOLN: 500.0000 mL | Freq: Once | INTRAVENOUS | Status: DC | PRN

## 2021-06-19 MED ORDER — POTASSIUM CHLORIDE 10 MEQ/50ML IV SOLN
10.0000 meq | INTRAVENOUS | Status: AC
  Filled 2021-06-19: qty 50

## 2021-06-19 MED ORDER — DOCUSATE SODIUM 100 MG PO CAPS
200.0000 mg | ORAL_CAPSULE | Freq: Every day | ORAL | Status: DC
  Administered 2021-06-20 – 2021-06-22 (×3): 200 mg via ORAL
  Filled 2021-06-19 (×4): qty 2

## 2021-06-19 MED ORDER — SODIUM CHLORIDE 0.9% FLUSH: 3.0000 mL | INTRAVENOUS | Status: DC | PRN

## 2021-06-19 MED ORDER — MIDAZOLAM HCL 5 MG/5ML IJ SOLN
INTRAMUSCULAR | Status: DC | PRN
  Administered 2021-06-19: 1 mg via INTRAVENOUS
  Administered 2021-06-19 (×3): 2 mg via INTRAVENOUS
  Administered 2021-06-19: 1 mg via INTRAVENOUS
  Administered 2021-06-19: 2 mg via INTRAVENOUS

## 2021-06-19 MED ORDER — METOPROLOL TARTRATE 25 MG/10 ML ORAL SUSPENSION
12.5000 mg | Freq: Two times a day (BID) | ORAL | Status: DC
Start: 2021-06-19 — End: 2021-06-21
  Filled 2021-06-19: qty 5

## 2021-06-19 MED ORDER — CEFAZOLIN SODIUM-DEXTROSE 2-4 GM/100ML-% IV SOLN
2.0000 g | Freq: Three times a day (TID) | INTRAVENOUS | Status: AC
Start: 2021-06-19 — End: 2021-06-21
  Administered 2021-06-19 – 2021-06-21 (×6): 2 g via INTRAVENOUS
  Filled 2021-06-19 (×7): qty 100

## 2021-06-19 MED ORDER — METOPROLOL TARTRATE 12.5 MG HALF TABLET
12.5000 mg | ORAL_TABLET | Freq: Two times a day (BID) | ORAL | Status: DC
  Administered 2021-06-20 (×2): 12.5 mg via ORAL
  Filled 2021-06-19 (×2): qty 1

## 2021-06-19 MED ORDER — PROTAMINE SULFATE 10 MG/ML IV SOLN
INTRAVENOUS | Status: DC | PRN
  Administered 2021-06-19: 180 mg via INTRAVENOUS
  Administered 2021-06-19: 30 mg via INTRAVENOUS

## 2021-06-19 MED ORDER — PANTOPRAZOLE SODIUM 40 MG PO TBEC
40.0000 mg | DELAYED_RELEASE_TABLET | Freq: Every day | ORAL | Status: DC
  Administered 2021-06-21 – 2021-06-23 (×3): 40 mg via ORAL
  Filled 2021-06-19 (×3): qty 1

## 2021-06-19 MED ORDER — PHENYLEPHRINE HCL-NACL 20-0.9 MG/250ML-% IV SOLN
0.0000 ug/min | INTRAVENOUS | Status: DC
  Administered 2021-06-19: 35 ug/min via INTRAVENOUS
  Filled 2021-06-19: qty 250

## 2021-06-19 MED ORDER — ALBUMIN HUMAN 25 % IV SOLN
12.5000 g | INTRAVENOUS | Status: DC | PRN
Start: 2021-06-19 — End: 2021-06-23
  Administered 2021-06-19: 12.5 g via INTRAVENOUS
  Filled 2021-06-19: qty 50

## 2021-06-19 MED ORDER — LACTATED RINGERS IV SOLN: INTRAVENOUS | Status: DC

## 2021-06-19 MED ORDER — CHLORHEXIDINE GLUCONATE CLOTH 2 % EX PADS
6.0000 | MEDICATED_PAD | Freq: Every day | CUTANEOUS | Status: DC
  Administered 2021-06-21: 6 via TOPICAL

## 2021-06-19 MED ORDER — PROPOFOL 10 MG/ML IV BOLUS
INTRAVENOUS | Status: DC | PRN
  Administered 2021-06-19: 100 mg via INTRAVENOUS

## 2021-06-19 MED ORDER — SODIUM CHLORIDE 0.9% FLUSH
10.0000 mL | Freq: Two times a day (BID) | INTRAVENOUS | Status: DC
  Administered 2021-06-19: 10 mL
  Administered 2021-06-20: 40 mL
  Administered 2021-06-20: 30 mL

## 2021-06-19 MED ORDER — METOPROLOL TARTRATE 5 MG/5ML IV SOLN: 2.5000 mg | INTRAVENOUS | Status: DC | PRN

## 2021-06-19 MED ORDER — DEXAMETHASONE SODIUM PHOSPHATE 10 MG/ML IJ SOLN
INTRAMUSCULAR | Status: DC | PRN
  Administered 2021-06-19: 10 mg via INTRAVENOUS

## 2021-06-19 MED ORDER — CHLORHEXIDINE GLUCONATE CLOTH 2 % EX PADS
6.0000 | MEDICATED_PAD | Freq: Every day | CUTANEOUS | Status: DC
  Administered 2021-06-19 – 2021-06-20 (×2): 6 via TOPICAL

## 2021-06-19 MED ORDER — SODIUM CHLORIDE 0.9 % IV SOLN: INTRAVENOUS | Status: DC

## 2021-06-19 MED ORDER — MIDAZOLAM HCL 2 MG/2ML IJ SOLN: 2.0000 mg | INTRAMUSCULAR | Status: DC | PRN

## 2021-06-19 MED ORDER — SODIUM CHLORIDE 0.9% FLUSH: 10.0000 mL | INTRAVENOUS | Status: DC | PRN

## 2021-06-19 MED ORDER — ACETAMINOPHEN 160 MG/5ML PO SOLN: 1000.0000 mg | Freq: Four times a day (QID) | ORAL | Status: DC

## 2021-06-19 MED ORDER — HEPARIN SODIUM (PORCINE) 1000 UNIT/ML IJ SOLN
INTRAMUSCULAR | Status: DC | PRN
  Administered 2021-06-19: 21000 [IU] via INTRAVENOUS
  Administered 2021-06-19: 3000 [IU] via INTRAVENOUS

## 2021-06-19 MED ORDER — ASPIRIN 81 MG PO CHEW: 324.0000 mg | CHEWABLE_TABLET | Freq: Every day | ORAL | Status: DC

## 2021-06-19 MED ORDER — VANCOMYCIN HCL IN DEXTROSE 1-5 GM/200ML-% IV SOLN
1000.0000 mg | Freq: Once | INTRAVENOUS | Status: AC
  Administered 2021-06-19: 1000 mg via INTRAVENOUS
  Filled 2021-06-19: qty 200

## 2021-06-19 MED ORDER — NICARDIPINE HCL IN NACL 20-0.86 MG/200ML-% IV SOLN: 0.0000 mg/h | INTRAVENOUS | Status: DC

## 2021-06-19 MED ORDER — ONDANSETRON HCL 4 MG/2ML IJ SOLN
4.0000 mg | Freq: Four times a day (QID) | INTRAMUSCULAR | Status: DC | PRN
  Administered 2021-06-20 – 2021-06-23 (×5): 4 mg via INTRAVENOUS
  Filled 2021-06-19 (×5): qty 2

## 2021-06-19 MED ORDER — CHLORHEXIDINE GLUCONATE 0.12 % MT SOLN
15.0000 mL | OROMUCOSAL | Status: AC
  Administered 2021-06-19: 15 mL via OROMUCOSAL
  Filled 2021-06-19: qty 15

## 2021-06-19 MED ORDER — FENTANYL CITRATE (PF) 250 MCG/5ML IJ SOLN
INTRAMUSCULAR | Status: AC
  Filled 2021-06-19: qty 25

## 2021-06-19 MED ORDER — ACETAMINOPHEN 500 MG PO TABS
1000.0000 mg | ORAL_TABLET | Freq: Four times a day (QID) | ORAL | Status: DC
  Administered 2021-06-19 – 2021-06-23 (×14): 1000 mg via ORAL
  Filled 2021-06-19 (×13): qty 2

## 2021-06-19 MED ORDER — ASPIRIN EC 325 MG PO TBEC: 325.0000 mg | DELAYED_RELEASE_TABLET | Freq: Every day | ORAL | Status: DC

## 2021-06-19 MED ORDER — BISACODYL 10 MG RE SUPP: 10.0000 mg | Freq: Every day | RECTAL | Status: DC

## 2021-06-19 MED ORDER — ACETAMINOPHEN 650 MG RE SUPP
650.0000 mg | Freq: Once | RECTAL | Status: AC
  Administered 2021-06-19: 650 mg via RECTAL

## 2021-06-19 MED ORDER — SODIUM CHLORIDE 0.9 % IV SOLN: 250.0000 mL | INTRAVENOUS | Status: DC

## 2021-06-19 MED ORDER — FENTANYL CITRATE (PF) 250 MCG/5ML IJ SOLN
INTRAMUSCULAR | Status: DC | PRN
  Administered 2021-06-19: 50 ug via INTRAVENOUS
  Administered 2021-06-19 (×4): 100 ug via INTRAVENOUS
  Administered 2021-06-19: 50 ug via INTRAVENOUS
  Administered 2021-06-19 (×3): 100 ug via INTRAVENOUS
  Administered 2021-06-19: 250 ug via INTRAVENOUS

## 2021-06-19 MED ORDER — NOREPINEPHRINE 4 MG/250ML-% IV SOLN: 0.0000 ug/min | INTRAVENOUS | Status: DC

## 2021-06-19 MED ORDER — POTASSIUM CHLORIDE 10 MEQ/50ML IV SOLN
10.0000 meq | INTRAVENOUS | Status: AC
  Administered 2021-06-19 (×3): 10 meq via INTRAVENOUS

## 2021-06-19 MED ORDER — MIDAZOLAM HCL (PF) 10 MG/2ML IJ SOLN
INTRAMUSCULAR | Status: AC
  Filled 2021-06-19: qty 2

## 2021-06-19 MED ORDER — SODIUM CHLORIDE 0.45 % IV SOLN: INTRAVENOUS | Status: DC | PRN

## 2021-06-19 MED ORDER — INSULIN REGULAR(HUMAN) IN NACL 100-0.9 UT/100ML-% IV SOLN: INTRAVENOUS | Status: DC

## 2021-06-19 MED ORDER — SODIUM CHLORIDE 0.9% FLUSH: 3.0000 mL | Freq: Two times a day (BID) | INTRAVENOUS | Status: DC

## 2021-06-19 MED ORDER — NITROGLYCERIN IN D5W 200-5 MCG/ML-% IV SOLN
0.0000 ug/min | INTRAVENOUS | Status: DC
Start: 2021-06-19 — End: 2021-06-21

## 2021-06-19 MED ORDER — LACTATED RINGERS IV SOLN: INTRAVENOUS | Status: DC | PRN

## 2021-06-19 MED ORDER — DEXMEDETOMIDINE HCL IN NACL 400 MCG/100ML IV SOLN: 0.0000 ug/kg/h | INTRAVENOUS | Status: DC

## 2021-06-19 MED ORDER — ROCURONIUM BROMIDE 100 MG/10ML IV SOLN
INTRAVENOUS | Status: DC | PRN
  Administered 2021-06-19: 50 mg via INTRAVENOUS
  Administered 2021-06-19: 20 mg via INTRAVENOUS
  Administered 2021-06-19: 30 mg via INTRAVENOUS
  Administered 2021-06-19: 70 mg via INTRAVENOUS

## 2021-06-19 MED ORDER — OXYCODONE HCL 5 MG PO TABS
5.0000 mg | ORAL_TABLET | ORAL | Status: DC | PRN
  Administered 2021-06-19 – 2021-06-21 (×5): 10 mg via ORAL
  Filled 2021-06-19 (×5): qty 2

## 2021-06-19 MED ORDER — ALBUMIN HUMAN 5 % IV SOLN
250.0000 mL | INTRAVENOUS | Status: DC | PRN
Start: 2021-06-19 — End: 2021-06-19

## 2021-06-19 MED ORDER — ACETAMINOPHEN 160 MG/5ML PO SOLN: 650.0000 mg | Freq: Once | ORAL | Status: AC

## 2021-06-19 MED ORDER — SODIUM CHLORIDE 0.9 % IV BOLUS
250.0000 mL | INTRAVENOUS | Status: DC | PRN
  Administered 2021-06-19: 250 mL via INTRAVENOUS

## 2021-06-19 MED ORDER — HEMOSTATIC AGENTS (NO CHARGE) OPTIME
TOPICAL | Status: DC | PRN
Start: 2021-06-19 — End: 2021-06-19
  Administered 2021-06-19 (×3): 1 via TOPICAL

## 2021-06-19 MED ORDER — FAMOTIDINE IN NACL 20-0.9 MG/50ML-% IV SOLN
20.0000 mg | Freq: Two times a day (BID) | INTRAVENOUS | Status: AC
  Administered 2021-06-19: 20 mg via INTRAVENOUS
  Filled 2021-06-19: qty 50

## 2021-06-19 MED ORDER — TRAMADOL HCL 50 MG PO TABS
50.0000 mg | ORAL_TABLET | ORAL | Status: DC | PRN
  Administered 2021-06-19 – 2021-06-23 (×15): 100 mg via ORAL
  Filled 2021-06-19 (×15): qty 2

## 2021-06-19 MED ORDER — 0.9 % SODIUM CHLORIDE (POUR BTL) OPTIME
TOPICAL | Status: DC | PRN
  Administered 2021-06-19 (×4): 1000 mL

## 2021-06-19 SURGICAL SUPPLY — 86 items
BAG DECANTER FOR FLEXI CONT (MISCELLANEOUS) ×6 IMPLANT
BLADE CLIPPER SURG (BLADE) ×6 IMPLANT
BLADE STERNUM SYSTEM 6 (BLADE) ×6 IMPLANT
BLADE SURG 11 STRL SS (BLADE) ×6 IMPLANT
BLADE SURG 15 STRL LF DISP TIS (BLADE) ×5 IMPLANT
BLADE SURG 15 STRL SS (BLADE) ×1
BNDG ELASTIC 4X5.8 VLCR STR LF (GAUZE/BANDAGES/DRESSINGS) ×6 IMPLANT
BNDG ELASTIC 6X5.8 VLCR STR LF (GAUZE/BANDAGES/DRESSINGS) ×6 IMPLANT
BNDG GAUZE ELAST 4 BULKY (GAUZE/BANDAGES/DRESSINGS) ×12 IMPLANT
CABLE SURGICAL S-101-97-12 (CABLE) ×6 IMPLANT
CANISTER SUCT 3000ML PPV (MISCELLANEOUS) ×6 IMPLANT
CANNULA MC2 2 STG 29/37 NON-V (CANNULA) ×5 IMPLANT
CANNULA MC2 TWO STAGE (CANNULA) ×1
CANNULA NON VENT 20FR 12 (CANNULA) ×12 IMPLANT
CATH ROBINSON RED A/P 18FR (CATHETERS) ×12 IMPLANT
CLIP RETRACTION 3.0MM CORONARY (MISCELLANEOUS) ×6 IMPLANT
CLIP VESOCCLUDE MED 24/CT (CLIP) IMPLANT
CLIP VESOCCLUDE SM WIDE 24/CT (CLIP) IMPLANT
CONN ST 1/2X1/2  BEN (MISCELLANEOUS) ×1
CONN ST 1/2X1/2 BEN (MISCELLANEOUS) ×5 IMPLANT
CONNECTOR BLAKE 2:1 CARIO BLK (MISCELLANEOUS) ×6 IMPLANT
CONTAINER PROTECT SURGISLUSH (MISCELLANEOUS) ×6 IMPLANT
COVER MAYO STAND STRL (DRAPES) ×6 IMPLANT
DERMABOND ADVANCED (GAUZE/BANDAGES/DRESSINGS) ×1
DERMABOND ADVANCED .7 DNX12 (GAUZE/BANDAGES/DRESSINGS) ×5 IMPLANT
DRAIN CHANNEL 19F RND (DRAIN) ×18 IMPLANT
DRAIN CONNECTOR BLAKE 1:1 (MISCELLANEOUS) ×12 IMPLANT
DRAPE CARDIOVASCULAR INCISE (DRAPES) ×1
DRAPE EXTREMITY T 121X128X90 (DISPOSABLE) ×6 IMPLANT
DRAPE HALF SHEET 40X57 (DRAPES) ×6 IMPLANT
DRAPE INCISE IOBAN 66X45 STRL (DRAPES) IMPLANT
DRAPE SRG 135X102X78XABS (DRAPES) ×5 IMPLANT
DRAPE WARM FLUID 44X44 (DRAPES) ×6 IMPLANT
DRSG AQUACEL AG ADV 3.5X10 (GAUZE/BANDAGES/DRESSINGS) ×12 IMPLANT
DRSG COVADERM 4X14 (GAUZE/BANDAGES/DRESSINGS) ×6 IMPLANT
ELECT BLADE 4.0 EZ CLEAN MEGAD (MISCELLANEOUS) ×6
ELECT REM PT RETURN 9FT ADLT (ELECTROSURGICAL) ×12
ELECTRODE BLDE 4.0 EZ CLN MEGD (MISCELLANEOUS) ×5 IMPLANT
ELECTRODE REM PT RTRN 9FT ADLT (ELECTROSURGICAL) ×10 IMPLANT
FELT TEFLON 1X6 (MISCELLANEOUS) ×12 IMPLANT
GAUZE SPONGE 4X4 12PLY STRL (GAUZE/BANDAGES/DRESSINGS) ×6 IMPLANT
GAUZE SPONGE 4X4 12PLY STRL LF (GAUZE/BANDAGES/DRESSINGS) ×6 IMPLANT
GLOVE SURG ENC MOIS LTX SZ7 (GLOVE) ×12 IMPLANT
GLOVE SURG ENC TEXT LTX SZ7.5 (GLOVE) ×12 IMPLANT
GOWN STRL REUS W/ TWL LRG LVL3 (GOWN DISPOSABLE) ×20 IMPLANT
GOWN STRL REUS W/ TWL XL LVL3 (GOWN DISPOSABLE) ×30 IMPLANT
GOWN STRL REUS W/TWL LRG LVL3 (GOWN DISPOSABLE) ×4
GOWN STRL REUS W/TWL XL LVL3 (GOWN DISPOSABLE) ×6
HEMOSTAT POWDER SURGIFOAM 1G (HEMOSTASIS) ×36 IMPLANT
INSERT SUTURE HOLDER (MISCELLANEOUS) ×6 IMPLANT
KIT BASIN OR (CUSTOM PROCEDURE TRAY) ×6 IMPLANT
KIT SUCTION CATH 14FR (SUCTIONS) ×6 IMPLANT
KIT TURNOVER KIT B (KITS) ×6 IMPLANT
KIT VASOVIEW HEMOPRO 2 VH 4000 (KITS) ×12 IMPLANT
LEAD PACING MYOCARDI (MISCELLANEOUS) ×6 IMPLANT
LINE EXTENSION DELIVERY (MISCELLANEOUS) ×6 IMPLANT
MARKER GRAFT CORONARY BYPASS (MISCELLANEOUS) ×24 IMPLANT
NS IRRIG 1000ML POUR BTL (IV SOLUTION) ×30 IMPLANT
PACK ACCESSORY CANNULA KIT (KITS) ×12 IMPLANT
PACK E OPEN HEART (SUTURE) ×6 IMPLANT
PACK OPEN HEART (CUSTOM PROCEDURE TRAY) ×6 IMPLANT
PAD ARMBOARD 7.5X6 YLW CONV (MISCELLANEOUS) ×12 IMPLANT
PAD ELECT DEFIB RADIOL ZOLL (MISCELLANEOUS) ×6 IMPLANT
PENCIL BUTTON HOLSTER BLD 10FT (ELECTRODE) ×6 IMPLANT
POSITIONER HEAD DONUT 9IN (MISCELLANEOUS) ×6 IMPLANT
PUNCH AORTIC ROTATE 4.0MM (MISCELLANEOUS) ×6 IMPLANT
SET MPS 3-ND DEL (MISCELLANEOUS) ×6 IMPLANT
SHEARS HARMONIC 9CM CVD (BLADE) ×6 IMPLANT
SUPPORT HEART JANKE-BARRON (MISCELLANEOUS) ×6 IMPLANT
SUT BONE WAX W31G (SUTURE) ×6 IMPLANT
SUT ETHIBOND X763 2 0 SH 1 (SUTURE) ×12 IMPLANT
SUT MNCRL AB 3-0 PS2 18 (SUTURE) ×12 IMPLANT
SUT PDS AB 1 CTX 36 (SUTURE) ×12 IMPLANT
SUT PROLENE 4 0 SH DA (SUTURE) ×6 IMPLANT
SUT PROLENE 5 0 C 1 36 (SUTURE) ×24 IMPLANT
SUT PROLENE 7 0 BV1 MDA (SUTURE) ×18 IMPLANT
SUT STEEL 6MS V (SUTURE) ×12 IMPLANT
SUT VIC AB 2-0 CT1 27 (SUTURE) ×3
SUT VIC AB 2-0 CT1 TAPERPNT 27 (SUTURE) ×15 IMPLANT
SYSTEM SAHARA CHEST DRAIN ATS (WOUND CARE) ×12 IMPLANT
TOWEL GREEN STERILE (TOWEL DISPOSABLE) ×6 IMPLANT
TOWEL GREEN STERILE FF (TOWEL DISPOSABLE) ×6 IMPLANT
TRAY FOLEY SLVR 16FR TEMP STAT (SET/KITS/TRAYS/PACK) ×6 IMPLANT
TUBING LAP HI FLOW INSUFFLATIO (TUBING) ×6 IMPLANT
UNDERPAD 30X36 HEAVY ABSORB (UNDERPADS AND DIAPERS) ×6 IMPLANT
WATER STERILE IRR 1000ML POUR (IV SOLUTION) ×12 IMPLANT

## 2021-06-19 NOTE — Progress Notes (Signed)
      [address]       [place] [address]             [phone]      S/p CABG x 3  Extubated  BP 115/77 (BP Location: Left Arm)   Pulse 92   Temp 98.1 F (36.7 C)   Resp 15   Ht 5\' 7"  (1.702 m)   Wt 74.8 kg   SpO2 97%   BMI 25.83 kg/m  4L [place] 97% sat CI= 3.3, CVP= 3  [place]o at 15   Intake/Output Summary (Last 24 hours) at 06/19/2021 1705 Last data filed at 06/19/2021 1700 Gross per 24 hour  Intake 2731.24 ml  Output 2644 ml  Net 87.24 ml   Hct 34  Doing well early postop  06/21/2021 C. Viviann Spare, MD [facility] [phone]

## 2021-06-19 NOTE — Anesthesia Procedure Notes (Signed)
Central Venous Catheter Insertion Performed by: Leonides Grills, MD, anesthesiologist Start/End10/17/2022 6:45 AM, 06/19/2021 7:00 AM Patient location: Pre-op. Preanesthetic checklist: patient identified, IV checked, site marked, risks and benefits discussed, surgical consent, monitors and equipment checked, pre-op evaluation, timeout performed and anesthesia consent Position: Trendelenburg Lidocaine 1% used for infiltration and patient sedated Hand hygiene performed , maximum sterile barriers used  and Seldinger technique used Catheter size: 8.5 Fr Total catheter length 10. Sheath introducer Procedure performed using ultrasound guided technique. Ultrasound Notes:anatomy identified, needle tip was noted to be adjacent to the nerve/plexus identified, no ultrasound evidence of intravascular and/or intraneural injection and image(s) printed for medical record Attempts: 1 Following insertion, line sutured, dressing applied and Biopatch. Post procedure assessment: blood return through all ports, free fluid flow and no air  Patient tolerated the procedure well with no immediate complications. Additional procedure comments: Triple lumen CVC placed into introducer.

## 2021-06-19 NOTE — Anesthesia Procedure Notes (Signed)
Arterial Line Insertion Start/End10/17/2022 7:10 AM, 06/19/2021 7:15 AM Performed by: Leonides Grills, MD, Lanell Matar, CRNA, CRNA  Preanesthetic checklist: patient identified, IV checked, site marked, risks and benefits discussed, surgical consent, monitors and equipment checked, pre-op evaluation, timeout performed and anesthesia consent Lidocaine 1% used for infiltration Right, radial was placed Catheter size: 20 G Hand hygiene performed  and maximum sterile barriers used  Allen's test indicative of satisfactory collateral circulation Attempts: 1 Procedure performed without using ultrasound guided technique. Following insertion, Biopatch and dressing applied. Post procedure assessment: normal  Patient tolerated the procedure well with no immediate complications.

## 2021-06-19 NOTE — Op Note (Signed)
[address]       [place] [address]             [phone]                                          06/19/2021 Patient:  Hansel Feinstein Pre-Op Dx: NSTEMI 3V CAD DM HTN   Post-op Dx:  same Procedure: CABG X 5.  LIMA LAD, Radial artery to OM, RSVG to D1, D2, PDA  Endoscopic greater saphenous vein harvest on the right Left open radial artery harvest.   Surgeon and Role:      * Arlenne Kimbley, Eliezer Lofts, MD - Primary    Webb Laws, PA-C - assisting Assistant: Jacques Earthly , PA-C  Anesthesia  general EBL:  Blood Administration: none Xclamp Time:  69 min Pump Time:   Drains: 19 F blake drain: R, L, mediastinal  Wires: none Counts: correct   Indications: This is a 55 year old gentleman presents with an NSTEMI.  He underwent a left heart catheterization which showed severe three-vessel coronary disease.  His echocardiogram shows preserved biventricular function.  He also underwent a CTA of the chest which shows a 28 mm a sending aorta.  He has no mitral valve or tricuspid valve disease.  On review of his left heart catheterization he does have diffuse disease consistent with diabetes, however given his age I think that his best option would be surgical revascularization.  We discussed the risks and benefits of this, and we will plan for coronary artery bypass grafting with the use of the left radial artery as well.    Findings: Good LIMA, good radial, small vein. Good peripheral targets.  Operative Technique: All invasive lines were placed in pre-op holding.  After the risks, benefits and alternatives were thoroughly discussed, the patient was brought to the operative theatre.  Anesthesia was induced, and the patient was prepped and draped in normal sterile fashion.  An appropriate surgical pause was performed, and pre-operative antibiotics were dosed accordingly.  We began with simultaneous incisions along the right leg for harvesting of the  greater saphenous vein, the left arm for harvesting of the radial artery and the chest for the sternotomy.  In regards to the sternotomy, this was carried down with bovie cautery, and the sternum was divided with a reciprocating saw.  Meticulous hemostasis was obtained.  The left internal thoracic artery was exposed and harvested in in pedicled fashion.  The patient was systemically heparinized, and the artery was divided distally, and placed in a papaverine sponge.    The sternal elevator was removed, and a retractor was placed.  The pericardium was divided in the midline and fashioned into a cradle with pericardial stitches.   After we confirmed an appropriate ACT, the ascending aorta was cannulated in standard fashion.  The right atrial appendage was used for venous cannulation site.  Cardiopulmonary bypass was initiated, and the heart retractor was placed. The cross clamp was applied, and a dose of anterograde cardioplegia was given with good arrest of the heart.  We moved to the posterior wall of the heart, and found a good target on the PDA.  An arteriotomy was made, and the vein graft was anastomosed to it in an end to side fashion.  Next we exposed the lateral wall, and found a good target on the OM.  An end to side anastomosis with the radial artery graft was then created.  Next, we exposed the anterior wall of the heart and identified a good target on 1st and 2nd diagonal.   An arteriotomy was created.  The vein was anastomosed to each in an end to side fashion.  Finally, we exposed a good target on the LAD, and fashioned an end to side anastomosis between it and the LITA.  We began to re-warm, and a re-animation dose of cardioplegia was given.  The heart was de-aired, and the cross clamp was removed.  Meticulous hemostasis was obtained.    A partial occludding clamp was then placed on the ascending aorta, and we created an end to side anastomosis between it and the proximal vein grafts.  The proximal  sites were marked with rings.  The radial artery graft was jumped off the hood the diagonal vein graft.  Hemostasis was obtained, and we separated from cardiopulmonary bypass without event.  The heparin was reversed with protamine.  Chest tubes and wires were placed, and the sternum was re-approximated with sternal wires.  The soft tissue and skin were re-approximated wth absorbable suture.    The patient tolerated the procedure without any immediate complications, and was transferred to the ICU in guarded condition.  Bobie Caris Keane Scrape

## 2021-06-19 NOTE — Brief Op Note (Signed)
06/14/2021 - 06/19/2021  11:58 AM  PATIENT:  Matthew Mann  55 y.o. male  PRE-OPERATIVE DIAGNOSIS:  Coronary Artery Disease  POST-OPERATIVE DIAGNOSIS:  Coronary Artery Disease  PROCEDURE:  Procedure(s): CORONARY ARTERY BYPASS GRAFTING TIMES 5 (N/A) TRANSESOPHAGEAL ECHOCARDIOGRAM (TEE) (N/A) APPLICATION OF CELL SAVER ENDOVEIN HARVEST OF GREATER SAPHENOUS VEIN (Right) RADIAL ARTERY HARVEST (Left) LIMA-LAD SVG-DIAG1 SVG-DIAG2 LEFT RADIAL-OM SVG-PDA  SURGEON:  Surgeon(s) and Role:    * Lightfoot, Eliezer Lofts, MD - Primary  PHYSICIAN ASSISTANT: Ereka Brau PA-C., DONIELLE ZIMMERMAN PA-C  ANESTHESIA:   general  EBL:  see anesthesia and perfusion records for full details  BLOOD ADMINISTERED:none  DRAINS:  LEFT PLEURAL AND MEDIASTINAL CHEST TUBES    LOCAL MEDICATIONS USED:  NONE  SPECIMEN:  No Specimen  DISPOSITION OF SPECIMEN:  N/A  COUNTS:  YES  TOURNIQUET:  * No tourniquets in log *  DICTATION: .Dragon Dictation  PLAN OF CARE: Admit to inpatient   PATIENT DISPOSITION:  ICU - intubated and hemodynamically stable.   Delay start of Pharmacological VTE agent (>24hrs) due to surgical blood loss or risk of bleeding: yes  COMPLICATIONS: NO KNOWN

## 2021-06-19 NOTE — Anesthesia Procedure Notes (Signed)
Procedure Name: Intubation Date/Time: 06/19/2021 7:47 AM Performed by: Vonna Drafts, CRNA Pre-anesthesia Checklist: Patient identified, Emergency Drugs available, Suction available and Patient being monitored Patient Re-evaluated:Patient Re-evaluated prior to induction Oxygen Delivery Method: Circle system utilized Preoxygenation: Pre-oxygenation with 100% oxygen Induction Type: IV induction Ventilation: Mask ventilation without difficulty Laryngoscope Size: Mac and 4 Grade View: Grade I Tube type: Oral Tube size: 8.0 mm Number of attempts: 1 Airway Equipment and Method: Stylet and Oral airway Placement Confirmation: ETT inserted through vocal cords under direct vision, positive ETCO2 and breath sounds checked- equal and bilateral Secured at: 23 cm Tube secured with: Tape Dental Injury: Teeth and Oropharynx as per pre-operative assessment

## 2021-06-19 NOTE — Progress Notes (Signed)
RT NOTES: Sats dropped to 89%. Pt placed on fio2 of 70%. Sats now 92%.

## 2021-06-19 NOTE — Progress Notes (Signed)
     [address]       [place] [address]             [phone]       No events overnight  Vitals:   06/18/21 2325 06/19/21 0249  BP: 131/84 119/86  Pulse: 78 68  Resp: 16 14  Temp: 98.4 F (36.9 C) 98.3 F (36.8 C)  SpO2: 94% 93%   Alert NAD Sinus EWOB  OR today for CABG with L radial artery harvest  Matthew Mann

## 2021-06-19 NOTE — Procedures (Signed)
Extubation Procedure Note  Patient Details:   Name: Matthew Mann DOB: 08/24/1966 MRN: [id number]   Airway Documentation:    Vent end date: 06/19/21 Vent end time: 1618   Evaluation  O2 sats: stable throughout Complications: No apparent complications Patient did tolerate procedure well. Bilateral Breath Sounds: Clear, Diminished   Pt extubated to 4L Delta per rapid wean protocol. NIF was -24 and VC was 1.1L. Pt had positive cuff leak prior to extubation. No stridor noted.  Guss Bunde 06/19/2021, 4:18 PM

## 2021-06-19 NOTE — Anesthesia Postprocedure Evaluation (Signed)
Anesthesia Post Note  Patient: Matthew Mann  Procedure(s) Performed: CORONARY ARTERY BYPASS GRAFTING TIMES 5 (Chest) TRANSESOPHAGEAL ECHOCARDIOGRAM (TEE) APPLICATION OF CELL SAVER ENDOVEIN HARVEST OF GREATER SAPHENOUS VEIN (Right: Leg Lower) RADIAL ARTERY HARVEST (Left: Arm Lower)     Patient location during evaluation: SICU Anesthesia Type: General Level of consciousness: sedated Pain management: pain level controlled Vital Signs Assessment: post-procedure vital signs reviewed and stable Respiratory status: patient remains intubated per anesthesia plan Cardiovascular status: stable Postop Assessment: no apparent nausea or vomiting Anesthetic complications: no   No notable events documented.  Last Vitals:  Vitals:   06/19/21 1600 06/19/21 1617  BP: 115/77   Pulse: 86 92  Resp: 11 15  Temp: 36.7 C 36.7 C  SpO2: 98% 97%    Last Pain:  Vitals:   06/19/21 1600  TempSrc: Bladder  PainSc:                  Catheryn Bacon Nakeshia Waldeck

## 2021-06-19 NOTE — Transfer of Care (Signed)
Immediate Anesthesia Transfer of Care Note  Patient: Matthew Mann  Procedure(s) Performed: CORONARY ARTERY BYPASS GRAFTING TIMES 5 (Chest) TRANSESOPHAGEAL ECHOCARDIOGRAM (TEE) APPLICATION OF CELL SAVER ENDOVEIN HARVEST OF GREATER SAPHENOUS VEIN (Right: Leg Lower) RADIAL ARTERY HARVEST (Left: Arm Lower)  Patient Location: PACU and SICU  Anesthesia Type:General  Level of Consciousness: Patient remains intubated per anesthesia plan  Airway & Oxygen Therapy: Patient remains intubated per anesthesia plan and Patient placed on Ventilator (see vital sign flow sheet for setting)  Post-op Assessment: Report given to RN and Post -op Vital signs reviewed and stable  Post vital signs: Reviewed and stable  Last Vitals:  Vitals Value Taken Time  BP 117/83 06/19/21 1323  Temp    Pulse 86 06/19/21 1326  Resp 13 06/19/21 1326  SpO2 92 % 06/19/21 1326  Vitals shown include unvalidated device data.  Last Pain:  Vitals:   06/19/21 0400  TempSrc:   PainSc: 0-No pain         Complications: No notable events documented.

## 2021-06-20 ENCOUNTER — Other Ambulatory Visit: Payer: Self-pay | Admitting: Family Medicine

## 2021-06-20 ENCOUNTER — Inpatient Hospital Stay (HOSPITAL_COMMUNITY): Payer: 59

## 2021-06-20 ENCOUNTER — Encounter (HOSPITAL_COMMUNITY): Payer: Self-pay | Admitting: Thoracic Surgery (Cardiothoracic Vascular Surgery)

## 2021-06-20 DIAGNOSIS — Z951 Presence of aortocoronary bypass graft: Secondary | ICD-10-CM

## 2021-06-20 DIAGNOSIS — E1141 Type 2 diabetes mellitus with diabetic mononeuropathy: Secondary | ICD-10-CM

## 2021-06-20 DIAGNOSIS — I10 Essential (primary) hypertension: Secondary | ICD-10-CM

## 2021-06-20 LAB — GLUCOSE, CAPILLARY
Glucose-Capillary: 104 mg/dL — ABNORMAL HIGH (ref 70–99)
Glucose-Capillary: 109 mg/dL — ABNORMAL HIGH (ref 70–99)
Glucose-Capillary: 110 mg/dL — ABNORMAL HIGH (ref 70–99)
Glucose-Capillary: 112 mg/dL — ABNORMAL HIGH (ref 70–99)
Glucose-Capillary: 148 mg/dL — ABNORMAL HIGH (ref 70–99)
Glucose-Capillary: 158 mg/dL — ABNORMAL HIGH (ref 70–99)
Glucose-Capillary: 174 mg/dL — ABNORMAL HIGH (ref 70–99)
Glucose-Capillary: 187 mg/dL — ABNORMAL HIGH (ref 70–99)
Glucose-Capillary: 190 mg/dL — ABNORMAL HIGH (ref 70–99)
Glucose-Capillary: 197 mg/dL — ABNORMAL HIGH (ref 70–99)
Glucose-Capillary: 206 mg/dL — ABNORMAL HIGH (ref 70–99)
Glucose-Capillary: 67 mg/dL — ABNORMAL LOW (ref 70–99)

## 2021-06-20 LAB — CBC
HCT: 38 % — ABNORMAL LOW (ref 39.0–52.0)
HCT: 39.9 % (ref 39.0–52.0)
Hemoglobin: 12.6 g/dL — ABNORMAL LOW (ref 13.0–17.0)
Hemoglobin: 13.3 g/dL (ref 13.0–17.0)
MCH: 29.4 pg (ref 26.0–34.0)
MCH: 30.2 pg (ref 26.0–34.0)
MCHC: 33.2 g/dL (ref 30.0–36.0)
MCHC: 33.3 g/dL (ref 30.0–36.0)
MCV: 88.6 fL (ref 80.0–100.0)
MCV: 90.5 fL (ref 80.0–100.0)
Platelets: 259 K/uL (ref 150–400)
Platelets: 260 10*3/uL (ref 150–400)
RBC: 4.29 MIL/uL (ref 4.22–5.81)
RBC: 4.41 MIL/uL (ref 4.22–5.81)
RDW: 13.3 % (ref 11.5–15.5)
RDW: 13.3 % (ref 11.5–15.5)
WBC: 23.1 K/uL — ABNORMAL HIGH (ref 4.0–10.5)
WBC: 22.6 10*3/uL — ABNORMAL HIGH (ref 4.0–10.5)
nRBC: 0 % (ref 0.0–0.2)
nRBC: 0 % (ref 0.0–0.2)

## 2021-06-20 LAB — BASIC METABOLIC PANEL
Anion gap: 8 (ref 5–15)
Anion gap: 12 (ref 5–15)
BUN: 16 mg/dL (ref 6–20)
BUN: 20 mg/dL (ref 6–20)
CO2: 21 mmol/L — ABNORMAL LOW (ref 22–32)
CO2: 21 mmol/L — ABNORMAL LOW (ref 22–32)
Calcium: 8.5 mg/dL — ABNORMAL LOW (ref 8.9–10.3)
Calcium: 8.9 mg/dL (ref 8.9–10.3)
Chloride: 104 mmol/L (ref 98–111)
Chloride: 100 mmol/L (ref 98–111)
Creatinine, Ser: 1.02 mg/dL (ref 0.61–1.24)
Creatinine, Ser: 1.15 mg/dL (ref 0.61–1.24)
GFR, Estimated: >60 mL/min (ref >60)
GFR, Estimated: >60 mL/min (ref >60)
Glucose, Bld: 116 mg/dL — ABNORMAL HIGH (ref 70–99)
Glucose, Bld: 215 mg/dL — ABNORMAL HIGH (ref 70–99)
Potassium: 4.6 mmol/L (ref 3.5–5.1)
Potassium: 4.7 mmol/L (ref 3.5–5.1)
Sodium: 133 mmol/L — ABNORMAL LOW (ref 135–145)
Sodium: 133 mmol/L — ABNORMAL LOW (ref 135–145)

## 2021-06-20 LAB — MAGNESIUM
Magnesium: 2.4 mg/dL (ref 1.7–2.4)
Magnesium: 2.5 mg/dL — ABNORMAL HIGH (ref 1.7–2.4)

## 2021-06-20 MED ORDER — SODIUM CHLORIDE 0.9% FLUSH
3.0000 mL | Freq: Two times a day (BID) | INTRAVENOUS | Status: DC
  Administered 2021-06-20 – 2021-06-23 (×5): 3 mL via INTRAVENOUS

## 2021-06-20 MED ORDER — ~~LOC~~ CARDIAC SURGERY, PATIENT & FAMILY EDUCATION: Freq: Once | Status: DC

## 2021-06-20 MED ORDER — ASPIRIN EC 81 MG PO TBEC
81.0000 mg | DELAYED_RELEASE_TABLET | Freq: Every day | ORAL | Status: DC
  Administered 2021-06-20 – 2021-06-23 (×4): 81 mg via ORAL
  Filled 2021-06-20 (×4): qty 1

## 2021-06-20 MED ORDER — AMLODIPINE BESYLATE 5 MG PO TABS
5.0000 mg | ORAL_TABLET | Freq: Every day | ORAL | Status: DC
  Administered 2021-06-20 – 2021-06-23 (×4): 5 mg via ORAL
  Filled 2021-06-20 (×4): qty 1

## 2021-06-20 MED ORDER — CLOPIDOGREL BISULFATE 75 MG PO TABS
75.0000 mg | ORAL_TABLET | Freq: Every day | ORAL | Status: DC
  Administered 2021-06-20 – 2021-06-23 (×4): 75 mg via ORAL
  Filled 2021-06-20 (×4): qty 1

## 2021-06-20 MED ORDER — SODIUM CHLORIDE 0.9 % IV SOLN: 250.0000 mL | INTRAVENOUS | Status: DC | PRN

## 2021-06-20 MED ORDER — INSULIN ASPART 100 UNIT/ML IJ SOLN
0.0000 [IU] | INTRAMUSCULAR | Status: DC
  Administered 2021-06-20: 4 [IU] via SUBCUTANEOUS
  Administered 2021-06-20: 8 [IU] via SUBCUTANEOUS
  Administered 2021-06-20: 4 [IU] via SUBCUTANEOUS
  Administered 2021-06-21 (×2): 2 [IU] via SUBCUTANEOUS
  Administered 2021-06-21 (×3): 4 [IU] via SUBCUTANEOUS
  Administered 2021-06-21: 2 [IU] via SUBCUTANEOUS
  Administered 2021-06-22: 8 [IU] via SUBCUTANEOUS
  Administered 2021-06-22 (×2): 4 [IU] via SUBCUTANEOUS
  Administered 2021-06-22 – 2021-06-23 (×2): 2 [IU] via SUBCUTANEOUS

## 2021-06-20 MED ORDER — SODIUM CHLORIDE 0.9% FLUSH: 3.0000 mL | INTRAVENOUS | Status: DC | PRN

## 2021-06-20 MED ORDER — MUPIROCIN 2 % EX OINT: 1.0000 "application " | TOPICAL_OINTMENT | Freq: Two times a day (BID) | CUTANEOUS | Status: DC

## 2021-06-20 NOTE — Hospital Course (Addendum)
History of Present Illness:   At time of surgical consultation [name] is a 55 year old male with past history significant for type 2 diabetes, hypertension, and dyslipidemia.  He also has a positive family history for premature coronary artery disease.  He presented to the emergency room yesterday given a 4-day history of pressure-like for occurring intermittently over the previous 4 days.  In the emergency room, EKG showed sinus rhythm with nonspecific ST-T wave changes.  High-sensitivity troponin was 278.  Chest x-ray showed no active disease and CT scan of the chest showed cardiopulmonary disease.  There was minimal atherosclerotic plaque in the thoracic aorta.  There were some mild granulomatous changes in the lungs as well as a 4 mm noncalcified left lower lobe pulmonary nodule.  Having ruled in for acute non-ST elevation myocardial infarction, [name] was admitted to the hospital and started on a heparin drip along with PRN SL NTG, high intensity statin, aspirin, and a beta-blocker.  He remained stable following admission with no further chest pain.  He was taken to the Cath Lab yesterday for left heart catheterization which demonstrated a left dominant system with high-grade obstruction is in the LAD and circumflex distributions.  Please see the cath report below.  Left ventricular function was well-preserved.  An echocardiogram has been done this morning with results still pending.   Currently , [name] is free of chest pain and resting comfortably in bed with his wife at the bedside. He denies having any previous surgeries or injuries. He sees a dentist regularly with the last visit within the past month. He denies any lower extremity claudication or varicosities.   The patient and all relevant studies were reviewed by Dr. Cliffton Asters who recommended proceeding with coronary artery bypass grafting as his best revascularization option due to the severity of his anatomical findings and the  nature of his symptoms.  Hospital course:  The patient was medically stabilized to proceed with surgery and on 06/19/2021 he was taken to the operating room at which time he underwent CABG x3.  He tolerated the procedure well and was taken to the surgical intensive care unit in stable condition.  Postoperative hospital course:  The patient was extubated without difficulty using standard post cardiac surgical protocols.  He has remained hemodynamically stable requiring no inotropic or vasopressor support.  Matthew Mann, a line, foley were removed on post op day one. Chest tubes remained until post op day 2 and as output decreased, were removed on 10/19. Norvasc has been added as an adjuvant therapy for radial artery harvest.  He is also placed on Plavix for ACS on admission.  He was started on a course of routine pulmonary hygiene and oxygen weaning.  His blood sugars have been under adequate control using standard protocols.  He will be restarted on Glimepiride and Farxiga at discharge. A diabetes coordinator consult has been obtained to assist with management in the postoperative period.  He does have an expected acute blood loss anemia which is mild and is being monitored clinically.  He has normal renal function excess volume by weight. He was felt surgically stable for transfer from the ICU to [facility] for further convalescence on 06/20/2021. He continued to maintain SR. He was still requiring several liters of oxygen via [place] but was later weaned to room air with good oxygenation. PA/LAT CXR 10/20 showed trace left apical pneumothorax. Sternal, LUE, and RLE wounds are clean, dry, healing without signs of infection. He has been tolerating a  diet and has had a bowel movement. He did have complaints of dizziness, nausea, and vomiting. He has had vertigo in the past and believes this is what he has. He was given Meclizine PRN.  The patient has remained stable.  His dizziness persists, but he is able to get up and  ambulate without issue.  He has not had any further N/V.  He is tolerating a diet and felt medically stable for discharge home today.

## 2021-06-20 NOTE — Plan of Care (Signed)

## 2021-06-20 NOTE — Discharge Summary (Signed)
[address]       [place] [address]             [phone]    Physician Discharge Summary  Patient ID: Matthew Mann MRN: [id number] DOB/AGE: [birth year] 53 y.o.  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 is a 55 year old male with past history significant for type 2 diabetes, hypertension, and dyslipidemia.  He also has a positive family history for premature coronary artery disease.  He presented to the emergency room yesterday given a 4-day history of pressure-like for occurring intermittently over the previous 4 days.  In the emergency room, EKG showed sinus rhythm with nonspecific ST-T wave changes.  High-sensitivity troponin was 278.  Chest x-ray showed no active disease and CT scan of the chest showed cardiopulmonary disease.  There was minimal atherosclerotic plaque in the thoracic aorta.  There were some mild granulomatous changes in the lungs  as well as a 4 mm noncalcified left lower lobe pulmonary nodule.  Having ruled in for acute non-ST elevation myocardial infarction, [name] was admitted to the hospital and started on a heparin drip along with PRN SL NTG, high intensity statin, aspirin, and a beta-blocker.  He remained stable following admission with no further chest pain.  He was taken to the Cath Lab yesterday for left heart catheterization which demonstrated a left dominant system with high-grade obstruction is in the LAD and circumflex distributions.  Please see the cath report below.  Left ventricular function was well-preserved.  An echocardiogram has been done this morning with results still pending.   Currently , [name] is free of chest pain and resting comfortably in bed with his wife at the bedside. He denies having any previous surgeries or injuries. He sees a dentist regularly with the last visit within the past month. He denies any lower extremity claudication or varicosities.   The patient and all relevant studies were reviewed by Dr. Cliffton Asters who recommended proceeding with coronary artery bypass grafting as his best revascularization option due to the severity of his anatomical findings and the nature of his symptoms.  Hospital course:  The patient was medically stabilized to proceed with surgery and on 06/19/2021 he was taken to the operating room at which time he underwent CABG x3.  He tolerated the procedure well and was taken to the surgical intensive care unit in stable condition.  Postoperative hospital course:  The patient was extubated without difficulty using standard post cardiac surgical protocols.  He has remained hemodynamically stable requiring no inotropic or  vasopressor support.  Theone Murdoch, a line, foley were removed on post op day one. Chest tubes remained until post op day 2 and as output decreased, were removed on 10/19. Norvasc has been added as an adjuvant therapy for radial artery harvest.  He  is also placed on Plavix for ACS on admission.  He was started on a course of routine pulmonary hygiene and oxygen weaning.  His blood sugars have been under adequate control using standard protocols.  He will be restarted on Glimepiride and Farxiga at discharge. A diabetes coordinator consult has been obtained to assist with management in the postoperative period.  He does have an expected acute blood loss anemia which is mild and is being monitored clinically.  He has normal renal function excess volume by weight. He was felt surgically stable for transfer from the ICU to [facility] for further convalescence on 06/20/2021. He continued to maintain SR. He was still requiring several liters of oxygen via [place] but was later weaned to room air with good oxygenation. PA/LAT CXR 10/20 showed trace left apical pneumothorax. Sternal, LUE, and RLE wounds are clean, dry, healing without signs of infection. He has been tolerating a diet and has had a bowel movement. He did have complaints of dizziness, nausea, and vomiting. He has had vertigo in the past and believes this is what he has. He was given Meclizine PRN.  The patient has remained stable.  His dizziness persists, but he is able to get up and ambulate without issue.  He has not had any further N/V.  He is tolerating a diet and felt medically stable for discharge home today.   Consults: None  Significant Diagnostic Studies:  Study Result  Narrative & Impression  CLINICAL DATA:  Chest tube present .open heart surg,,PTX, sob   EXAM: PORTABLE CHEST - 1 VIEW   COMPARISON:  06/20/2021   FINDINGS: Right IJ central venous catheter, left chest and mediastinal drains stable. Low lung volumes. Left retrocardiac consolidation/atelectasis.   Heart size upper limits normal.   Possible small left effusion.  No pneumothorax.   CABG markers and sternotomy wires.   IMPRESSION: Persistent left basilar consolidation/atelectasis, with possible small effusion.    Support hardware stable in position.     Electronically Signed   By: Corlis Leak M.D.   On: 06/21/2021 07:25      Treatments: surgery:  06/19/2021 Patient:  Hansel Feinstein Pre-Op Dx: NSTEMI 3V CAD DM HTN   Post-op Dx:  same Procedure: CABG X 5.  LIMA LAD, Radial artery to OM, RSVG to D1, D2, PDA  Endoscopic greater saphenous vein harvest on the right Left open radial artery harvest.     Surgeon and Role:      * Lightfoot, Eliezer Lofts, MD - Primary    Webb Laws, PA-C - assisting Assistant: Jacques Earthly , PA-C   Discharge Exam: Blood pressure 112/75, pulse 90, temperature 98.2 F (36.8 C), temperature source Oral, resp. rate 16, height 5\' 7"  (1.702 m), weight 75.9 kg, SpO2 100 %.  General appearance: alert, cooperative, and no distress Heart: regular rate and rhythm Lungs: clear to auscultation bilaterally Abdomen: soft, non-tender; bowel sounds normal; no masses,  no organomegaly Extremities: edema trace Wound: clean and dry  Discharge Medications:  The patient has been discharged on:   1.Beta Blocker:  Yes [  x ]  No   [   ]                              If No, reason:  2.Ace Inhibitor/ARB: Yes [   ]                                     No  [  x  ]                                     If No, reason: labile BP  3.Statin:   Yes [  x ]                  No  [   ]                  If No, reason:  4.Ecasa:  Yes  [  x ]                  No   [   ]                  If No, reason:  Patient had ACS upon admission:YES  Plavix/P2Y12 inhibitor: Yes [  x ]                                      No  [   ]      Allergies as of 06/23/2021       Reactions   Ace Inhibitors Swelling, Other (See Comments)   Tongue swelling and cough.        Medication List     STOP taking these medications    hydrochlorothiazide 25 MG tablet Commonly known as: HYDRODIURIL   losartan 100 MG tablet Commonly known as: COZAAR   predniSONE 10 MG (21)  Tbpk tablet Commonly known as: STERAPRED UNI-PAK 21 TAB       TAKE these medications    acetaminophen 500 MG tablet Commonly known as: TYLENOL Take 2 tablets (1,000 mg total) by mouth every 6 (six) hours as needed.   amLODipine 5 MG tablet Commonly known as: NORVASC Take 1 tablet (5 mg total) by mouth daily. What changed:  medication strength how much to take   aspirin 81 MG EC tablet Take 1 tablet by mouth daily.   atorvastatin 80 MG tablet Commonly known as: LIPITOR Take 1 tablet (80 mg total) by mouth daily. What changed:  medication strength how much to take   clopidogrel 75 MG tablet Commonly known as: PLAVIX Take 1 tablet (75 mg total) by mouth daily.   dapagliflozin propanediol 10 MG Tabs tablet Commonly known as: Farxiga Take 1 tablet (10 mg total) by mouth daily.   glimepiride 4 MG tablet Commonly known as: AMARYL Take 1 tablet (4 mg total) by mouth in the morning and at bedtime.   metFORMIN 500 MG 24 hr tablet Commonly known as: GLUCOPHAGE-XR TAKE 1 TABLET BY MOUTH TWICE A DAY What changed: when to take this   metoprolol tartrate 25 MG tablet Commonly known as: LOPRESSOR Take 1 tablet (25 mg total) by mouth 2 (two) times daily.   ondansetron 8 MG disintegrating tablet  Commonly known as: Zofran ODT Take 1 tablet (8 mg total) by mouth every 8 (eight) hours as needed for nausea or vomiting.   traMADol 50 MG tablet Commonly known as: ULTRAM Take 1 tablet (50 mg total) by mouth every 6 (six) hours as needed for moderate pain.               Durable Medical Equipment  (From admission, onward)           Start     Ordered   06/23/21 0717  For home use only DME 3 n 1  Once        06/23/21 0716   06/23/21 0717  For home use only DME 4 wheeled rolling walker with seat  Once       Question:  Patient needs a walker to treat with the following condition  Answer:  S/P CABG (coronary artery bypass graft)   06/23/21 0716             Follow-up Information     Lightfoot, Eliezer Lofts, MD Follow up.   Specialty: Cardiothoracic Surgery Why: Please see discharge paperwork for follow-up appointment with Dr. Cliffton Asters.  It is a video appointment.  Please do not come to the office.  Other in person appointments will be made in the future. Contact information: [address] [place] [address] [phone]         Kathleene Hazel, MD Follow up.   Specialty: Cardiology Why: Please see discharge paperwork for follow-up appointment with cardiology. Contact information: [address] [place] [address] [phone]         Gwenlyn Fudge, FNP. Call.   Specialty: Family Medicine Why: for a follow up appointment regarding further diabetes management and surveillance of HGA1C 8.4 Contact information: [address] [place] [address] [phone]                 Signed: Lowella Dandy, PA-C 06/23/2021, 8:17 AM

## 2021-06-20 NOTE — Progress Notes (Signed)
Inpatient Diabetes Program Recommendations  AACE/ADA: New Consensus Statement on Inpatient Glycemic Control (2015)  Target Ranges:  Prepandial:   less than 140 mg/dL      Peak postprandial:   less than 180 mg/dL (1-2 hours)      Critically ill patients:  140 - 180 mg/dL   Lab Results  Component Value Date   GLUCAP 174 (H) 06/20/2021   HGBA1C 8.4 (H) 06/19/2021    Review of Glycemic Control Results for Matthew Mann, Matthew "NATE" (MRN [id number]) as of 06/20/2021 12:42  Ref. Range 06/20/2021 06:07 06/20/2021 07:00 06/20/2021 09:16 06/20/2021 09:50 06/20/2021 11:23  Glucose-Capillary Latest Ref Range: 70 - 99 mg/dL 122 (H) 482 (H) 67 (L) 109 (H) 174 (H)   Diabetes history: DM 2 Outpatient Diabetes medications:  Metformin 500 mg bid, Farxiga 10 mg daily, Amaryl 4 mg q AM and Amaryl 4 mg q PM Current orders for Inpatient glycemic control:  Novolog 0-24 units q 4 hours  Inpatient Diabetes Program Recommendations:    Agree with current orders.  Will follow.  A1C is greater than goal.  Thanks,  Beryl Meager, RN, BC-ADM Inpatient Diabetes Coordinator Pager [phone]  (8a-5p)

## 2021-06-20 NOTE — Progress Notes (Addendum)
[facility] DAILY ICU PROGRESS NOTE                   [address]            [place] [address]          [phone]   1 Day Post-Op Procedure(s) (LRB): CORONARY ARTERY BYPASS GRAFTING TIMES 5 (N/A) TRANSESOPHAGEAL ECHOCARDIOGRAM (TEE) (N/A) APPLICATION OF CELL SAVER ENDOVEIN HARVEST OF GREATER SAPHENOUS VEIN (Right) RADIAL ARTERY HARVEST (Left)  Total Length of Stay:  LOS: 6 days   Subjective: Extubated around 4:30pm. Awake and alert, stable VS and hemodynamics since surgery. On no vaso-active drips.  Objective: Vital signs in last 24 hours: Temp:  [97.5 F (36.4 C)-99.5 F (37.5 C)] 98.6 F (37 C) (10/18 0700) Pulse Rate:  [74-92] 80 (10/18 0700) Cardiac Rhythm: Normal sinus rhythm (10/18 0400) Resp:  [11-22] 15 (10/18 0700) BP: (89-128)/(39-88) 119/77 (10/18 0600) SpO2:  [92 %-98 %] 93 % (10/18 0700) Arterial Line BP: (82-143)/(49-72) 136/66 (10/18 0700) FiO2 (%):  [40 %-70 %] 40 % (10/17 1539) Weight:  [76.7 kg] 76.7 kg (10/18 0645)  Filed Weights   06/18/21 0513 06/19/21 0540 06/20/21 0645  Weight: 75.6 kg 74.8 kg 76.7 kg    Weight change: 1.9 kg   Hemodynamic parameters for last 24 hours: CVP:  [0 mmHg-16 mmHg] 3 mmHg  Intake/Output from previous day: 10/17 0701 - 10/18 0700 In: 3272.2 [I.V.:2025; Blood:335; IV Piggyback:912.1] Out: 5474 [Urine:4735; Blood:479; Chest Tube:260]  Intake/Output this shift: No intake/output data recorded.  Current Meds: Scheduled Meds:  acetaminophen  1,000 mg Oral Q6H   Or   acetaminophen (TYLENOL) oral liquid 160 mg/5 mL  1,000 mg Per Tube Q6H   amLODipine  5 mg Oral Daily   aspirin EC  81 mg Oral Daily   atorvastatin  80 mg Oral Daily   bisacodyl  10 mg Oral Daily   Or   bisacodyl  10 mg Rectal Daily   Chlorhexidine Gluconate Cloth  6 each Topical Daily   Chlorhexidine Gluconate Cloth  6 each Topical Q0600   clopidogrel  75 mg Oral Daily   docusate sodium  200 mg Oral Daily   insulin aspart  0-24  Units Subcutaneous Q4H   metoprolol tartrate  12.5 mg Oral BID   Or   metoprolol tartrate  12.5 mg Per Tube BID   [START ON 06/21/2021] pantoprazole  40 mg Oral Daily   sodium chloride flush  10-40 mL Intracatheter Q12H   sodium chloride flush  3 mL Intravenous Q12H   Continuous Infusions:  sodium chloride 10 mL/hr at 06/20/21 0700   sodium chloride     sodium chloride     albumin human Stopped (06/19/21 1701)   And   sodium chloride Stopped (06/19/21 1702)    ceFAZolin (ANCEF) IV Stopped (06/20/21 5035)   dexmedetomidine (PRECEDEX) IV infusion Stopped (06/19/21 1453)   DOBUTamine     famotidine (PEPCID) IV Stopped (06/19/21 1426)   insulin 0.4 Units/hr (06/20/21 0700)   lactated ringers     lactated ringers     lactated ringers 20 mL/hr at 06/20/21 0700   niCARDipine     nitroGLYCERIN Stopped (06/19/21 1627)   norepinephrine (LEVOPHED) Adult infusion     phenylephrine (NEO-SYNEPHRINE) Adult infusion Stopped (06/20/21 0657)   PRN Meds:.sodium chloride, albumin human **AND** sodium chloride, dextrose, lactated ringers, metoprolol tartrate, midazolam, morphine injection, ondansetron (ZOFRAN) IV, oxyCODONE, sodium chloride flush, sodium chloride flush, traMADol  General appearance: alert,  cooperative, and no distress Neurologic: intact Heart: RRR, no significant arrhythmias on monitor review Lungs: O2 at 1L with acceptable O2 sats.  Wound: Covered with surgical dressing.   Lab Results: CBC: Recent Labs    06/19/21 2032 06/20/21 0345  WBC 24.5* 23.1*  HGB 12.8* 12.6*  HCT 37.9* 38.0*  PLT 276 259   BMET:  Recent Labs    06/19/21 2032 06/20/21 0345  NA 134* 133*  K 4.7 4.6  CL 106 104  CO2 19* 21*  GLUCOSE 124* 116*  BUN 18 16  CREATININE 1.07 1.02  CALCIUM 8.1* 8.5*    CMET: Lab Results  Component Value Date   WBC 23.1 (H) 06/20/2021   HGB 12.6 (L) 06/20/2021   HCT 38.0 (L) 06/20/2021   PLT 259 06/20/2021   GLUCOSE 116 (H) 06/20/2021   CHOL 126  06/15/2021   TRIG 88 06/15/2021   HDL 35 (L) 06/15/2021   LDLCALC 73 06/15/2021   ALT 30 04/27/2021   AST 19 04/27/2021   NA 133 (L) 06/20/2021   K 4.6 06/20/2021   CL 104 06/20/2021   CREATININE 1.02 06/20/2021   BUN 16 06/20/2021   CO2 21 (L) 06/20/2021   INR 1.3 (H) 06/19/2021   HGBA1C 8.4 (H) 06/19/2021      PT/INR:  Recent Labs    06/19/21 1413  LABPROT 15.7*  INR 1.3*   Radiology: DG Chest Port 1 View  Result Date: 06/19/2021 CLINICAL DATA:  Interval CABG EXAM: PORTABLE CHEST 1 VIEW COMPARISON:  None. FINDINGS: ETT tip is approximately 5.0 cm from the carina. NG tube tip and side port project over the expected area of the stomach. Right IJ line with tip positioned over the expected area of the upper SVC. Mediastinal drains and left-sided chest tube. Low lung volumes with mild bilateral heterogeneous opacities, likely due to atelectasis. No large pleural effusion or evidence of pneumothorax. Cardiac and mediastinal contours are within normal limits status post median sternotomy and CABG. IMPRESSION: Postsurgical findings of interval CABG. Electronically Signed   By: Allegra Lai M.D.   On: 06/19/2021 14:14     Assessment/Plan: S/P Procedure(s) (LRB): CORONARY ARTERY BYPASS GRAFTING TIMES 5 (N/A) TRANSESOPHAGEAL ECHOCARDIOGRAM (TEE) (N/A) APPLICATION OF CELL SAVER ENDOVEIN HARVEST OF GREATER SAPHENOUS VEIN (Right) RADIAL ARTERY HARVEST (Left)  -CV- POD1 CABG x 5 for MVCAD presenting with acute NSTEMI, preserved LV function. Stable hemodynamics on no support. Add Norvasc for the radial graft and Plavix for ACS on admission. Leave CT's today. Mobilize.   -PULM- Oxygenating well, weaning O2. Working on pulmonary hygiene.  -ENDO- Type 2 DM.  Transition insulin drip to SSI. Request DM Coordinator consult.   -HEME-Mild expected acute blood loss anemia. Monitor.   -RENAL- Normal renal function, no significant volume excess by weight.   -Leukocytosis-  Not febrile, no  evidence of active infection. Monitor.       Leary Roca, PA-C [phone] 06/20/2021 7:56 AM  Agree with above. Patient doing well. Transfer 2C Will stop Plavix Will start amlodipine  Jayleigh Notarianni O Dava Rensch

## 2021-06-21 ENCOUNTER — Inpatient Hospital Stay (HOSPITAL_COMMUNITY): Payer: 59

## 2021-06-21 LAB — BASIC METABOLIC PANEL
Anion gap: 14 (ref 5–15)
BUN: 20 mg/dL (ref 6–20)
CO2: 25 mmol/L (ref 22–32)
Calcium: 8.5 mg/dL — ABNORMAL LOW (ref 8.9–10.3)
Chloride: 108 mmol/L (ref 98–111)
Creatinine, Ser: 0.92 mg/dL (ref 0.61–1.24)
GFR, Estimated: >60 mL/min (ref >60)
Glucose, Bld: 125 mg/dL — ABNORMAL HIGH (ref 70–99)
Potassium: 4.2 mmol/L (ref 3.5–5.1)
Sodium: 147 mmol/L — ABNORMAL HIGH (ref 135–145)

## 2021-06-21 LAB — GLUCOSE, CAPILLARY
Glucose-Capillary: 113 mg/dL — ABNORMAL HIGH (ref 70–99)
Glucose-Capillary: 131 mg/dL — ABNORMAL HIGH (ref 70–99)
Glucose-Capillary: 148 mg/dL — ABNORMAL HIGH (ref 70–99)
Glucose-Capillary: 162 mg/dL — ABNORMAL HIGH (ref 70–99)
Glucose-Capillary: 179 mg/dL — ABNORMAL HIGH (ref 70–99)
Glucose-Capillary: 180 mg/dL — ABNORMAL HIGH (ref 70–99)

## 2021-06-21 LAB — CBC
HCT: 34.2 % — ABNORMAL LOW (ref 39.0–52.0)
Hemoglobin: 11.4 g/dL — ABNORMAL LOW (ref 13.0–17.0)
MCH: 29.9 pg (ref 26.0–34.0)
MCHC: 33.3 g/dL (ref 30.0–36.0)
MCV: 89.8 fL (ref 80.0–100.0)
Platelets: 194 10*3/uL (ref 150–400)
RBC: 3.81 MIL/uL — ABNORMAL LOW (ref 4.22–5.81)
RDW: 13.4 % (ref 11.5–15.5)
WBC: 15.8 10*3/uL — ABNORMAL HIGH (ref 4.0–10.5)
nRBC: 0 % (ref 0.0–0.2)

## 2021-06-21 MED ORDER — POTASSIUM CHLORIDE CRYS ER 20 MEQ PO TBCR
20.0000 meq | EXTENDED_RELEASE_TABLET | Freq: Once | ORAL | Status: AC
  Administered 2021-06-21: 20 meq via ORAL
  Filled 2021-06-21: qty 1

## 2021-06-21 MED ORDER — FUROSEMIDE 10 MG/ML IJ SOLN
40.0000 mg | Freq: Once | INTRAMUSCULAR | Status: AC
  Administered 2021-06-21: 40 mg via INTRAVENOUS
  Filled 2021-06-21: qty 4

## 2021-06-21 MED ORDER — METOPROLOL TARTRATE 25 MG PO TABS
25.0000 mg | ORAL_TABLET | Freq: Two times a day (BID) | ORAL | Status: DC
  Administered 2021-06-21 – 2021-06-23 (×5): 25 mg via ORAL
  Filled 2021-06-21 (×5): qty 1

## 2021-06-21 MED FILL — Heparin Sodium (Porcine) Inj 1000 Unit/ML: INTRAMUSCULAR | Qty: 20 | Status: AC

## 2021-06-21 MED FILL — Sodium Bicarbonate IV Soln 8.4%: INTRAVENOUS | Qty: 50 | Status: AC

## 2021-06-21 MED FILL — Sodium Chloride IV Soln 0.9%: INTRAVENOUS | Qty: 2000 | Status: AC

## 2021-06-21 MED FILL — Mannitol IV Soln 20%: INTRAVENOUS | Qty: 500 | Status: AC

## 2021-06-21 MED FILL — Calcium Chloride Inj 10%: INTRAVENOUS | Qty: 10 | Status: AC

## 2021-06-21 MED FILL — Electrolyte-R (PH 7.4) Solution: INTRAVENOUS | Qty: 3000 | Status: AC

## 2021-06-21 MED FILL — Albumin, Human Inj 5%: INTRAVENOUS | Qty: 250 | Status: AC

## 2021-06-21 NOTE — Plan of Care (Signed)

## 2021-06-21 NOTE — Progress Notes (Signed)
Mobility Specialist Progress Note:   06/21/21 1500  Mobility  Activity Ambulated in hall  Level of Assistance Standby assist, set-up cues, supervision of patient - no hands on  Assistive Device Four wheel walker  Distance Ambulated (ft) 380 ft  Mobility Ambulated with assistance in hallway  Mobility Response Tolerated well  Mobility performed by Mobility specialist  $Mobility charge 1 Mobility   Pt received in bed willing to participate in mobility. Complaints of dizziness and nausea before and throughout ambulation. Required 6L O2 during ambulation. Pt returned to bed with call bell in reach, all needs met and wife present.   [facility] Health and safety inspector Phone [phone]

## 2021-06-21 NOTE — Progress Notes (Signed)
CARDIAC REHAB PHASE I   PRE:  Rate/Rhythm: 81 SR    BP: sitting 129/80    SaO2: 91 4L  MODE:  Ambulation: 380 ft   POST:  Rate/Rhythm: 86 SR    BP: sitting 129/81     SaO2: 96 4L  Pt stood and ambulated with standby assist with rollator and 6L initially. Decreased to 4L after 200 ft as SaO2 higher walking. Some dizziness and pain walking. To bed, VSS. In bed SaO2 decreased, kept on 4L in bed, 87-90. Wife present. Encouraged more walking and IS.  1213-1252   Harriet Masson CES, ACSM 06/21/2021 12:53 PM

## 2021-06-21 NOTE — Progress Notes (Signed)
830am: Right IJ/Transducer removed per order. Patient tolerated well site clean dry and intact.

## 2021-06-21 NOTE — Progress Notes (Addendum)
      [address]       [place] [address]             [phone]        2 Days Post-Op Procedure(s) (LRB): CORONARY ARTERY BYPASS GRAFTING TIMES 5 (N/A) TRANSESOPHAGEAL ECHOCARDIOGRAM (TEE) (N/A) APPLICATION OF CELL SAVER ENDOVEIN HARVEST OF GREATER SAPHENOUS VEIN (Right) RADIAL ARTERY HARVEST (Left)  Subjective: Patient had one episode of vomiting yesterday;it happened after being transferred from the ICU to [facility]. He denies nausea, vomiting, abdominal pain this am. Passing flatus but no bowel movement yet.  Objective: Vital signs in last 24 hours: Temp:  [98 F (36.7 C)-98.8 F (37.1 C)] 98.3 F (36.8 C) (10/19 0347) Pulse Rate:  [70-87] 78 (10/19 0600) Cardiac Rhythm: Normal sinus rhythm (10/19 0711) Resp:  [12-22] 21 (10/19 0600) BP: (104-139)/(65-86) 121/76 (10/19 0600) SpO2:  [90 %-95 %] 91 % (10/19 0600) Arterial Line BP: (85-140)/(63-64) 140/64 (10/18 1200) Weight:  [77.8 kg] 77.8 kg (10/19 0607)  Pre op weight  74.8 kg Current Weight  06/21/21 77.8 kg      Intake/Output from previous day: 10/18 0701 - 10/19 0700 In: 471.1 [I.V.:199.1; IV Piggyback:272] Out: 910 [Urine:790; Chest Tube:120]   Physical Exam:  Cardiovascular: RRR, Pulmonary: Diminished bibasilar breath sounds L>R Abdomen: Soft, non tender, bowel sounds present. Extremities: Mild bilateral lower extremity edema. Motor/sensory intact LUE Wounds: Aquacel dressing intact. RLE wound and LUE wounds are clean and dry.  No erythema or signs of infection.  Lab Results: CBC: Recent Labs    06/20/21 1515 06/21/21 0502  WBC 22.6* 15.8*  HGB 13.3 11.4*  HCT 39.9 34.2*  PLT 260 194   BMET:  Recent Labs    06/20/21 1515 06/21/21 0502  NA 133* 147*  K 4.7 4.2  CL 100 108  CO2 21* 25  GLUCOSE 215* 125*  BUN 20 20  CREATININE 1.15 0.92  CALCIUM 8.9 8.5*    PT/INR:  Lab Results  Component Value Date   INR 1.3 (H) 06/19/2021   INR 1.0 06/19/2021   ABG:   INR: Will add last result for INR, ABG once components are confirmed Will add last 4 CBG results once components are confirmed  Assessment/Plan:  1. CV - S/p NSTEMI. SR. On Lopressor 12.5 mg bid, Plavix 75 mg daily, Amlodipine 5 mg daily (for radial artery) 2.  Pulmonary - Chest tubes with 120 cc last 24 hours. Chest tubes are to suction, no air leak. On 4 liters of oxygen via [place]. Wean as able. CXR this am shows no pneumothorax, small left pleural effusion/atelectasis . Remove chest tubes. Check PA/LAT CXR in am. Encourage incentive spirometer. 3. Volume Overload - Lasix 40 mg IV today 4.  Expected post op acute blood loss anemia - H and H this am decreased to 11.4 and 34.2 5. DM-CBGs 190/148/131. Pre op HGA1C 8.4. Will restart Glimepiride and Farxiga closer to discharge. He will require close medical follow up after discharge.  6. Remove right IJ line, chest tubes 7. Encourage ambulation  Donielle M ZimmermanPA-C 06/21/2021,7:50 AM    Agree with above Doing well Will diurese today  Meghan Warshawsky O Deaundre [place]

## 2021-06-21 NOTE — Progress Notes (Addendum)
Patient having bouts of dizziness, light headed that he describes as feeling like "vertigo" patient states that he has a history of vertigo but hasnt had an episode in a long time. Patient given zofran before lunch.   Yesterday on arrival to unit patient had an episode of projectile vomit. Zofran was given.

## 2021-06-21 NOTE — Discharge Instructions (Signed)

## 2021-06-21 NOTE — Progress Notes (Signed)
915am: 3 Chest tube removed without complication. Sutures cut and removed, cleaned with betadine, chest tubes removed vaseline gauze and dry gauze applied. Site is clean and dry. Patient currently resting in bed.

## 2021-06-22 ENCOUNTER — Inpatient Hospital Stay (HOSPITAL_COMMUNITY): Payer: 59

## 2021-06-22 LAB — GLUCOSE, CAPILLARY
Glucose-Capillary: 113 mg/dL — ABNORMAL HIGH (ref 70–99)
Glucose-Capillary: 145 mg/dL — ABNORMAL HIGH (ref 70–99)
Glucose-Capillary: 162 mg/dL — ABNORMAL HIGH (ref 70–99)
Glucose-Capillary: 191 mg/dL — ABNORMAL HIGH (ref 70–99)
Glucose-Capillary: 237 mg/dL — ABNORMAL HIGH (ref 70–99)

## 2021-06-22 LAB — TYPE AND SCREEN
ABO/RH(D): O NEG
Antibody Screen: NEGATIVE
Unit division: 0
Unit division: 0

## 2021-06-22 LAB — BPAM RBC
Blood Product Expiration Date: 202210272359
Blood Product Expiration Date: 202210282359
Unit Type and Rh: 9500
Unit Type and Rh: 9500

## 2021-06-22 MED ORDER — FUROSEMIDE 40 MG PO TABS
40.0000 mg | ORAL_TABLET | Freq: Every day | ORAL | Status: DC
  Administered 2021-06-22 – 2021-06-23 (×2): 40 mg via ORAL
  Filled 2021-06-22 (×2): qty 1

## 2021-06-22 MED ORDER — LACTULOSE 10 GM/15ML PO SOLN
20.0000 g | Freq: Once | ORAL | Status: AC
  Administered 2021-06-22: 20 g via ORAL
  Filled 2021-06-22: qty 30

## 2021-06-22 MED ORDER — POTASSIUM CHLORIDE CRYS ER 20 MEQ PO TBCR
20.0000 meq | EXTENDED_RELEASE_TABLET | Freq: Every day | ORAL | Status: DC
  Administered 2021-06-22 – 2021-06-23 (×2): 20 meq via ORAL
  Filled 2021-06-22 (×2): qty 1

## 2021-06-22 MED ORDER — MECLIZINE HCL 25 MG PO TABS
25.0000 mg | ORAL_TABLET | Freq: Two times a day (BID) | ORAL | Status: DC | PRN
  Administered 2021-06-22: 25 mg via ORAL
  Filled 2021-06-22 (×2): qty 1

## 2021-06-22 MED FILL — Potassium Chloride Inj 2 mEq/ML: INTRAVENOUS | Qty: 40 | Status: AC

## 2021-06-22 MED FILL — Heparin Sodium (Porcine) Inj 1000 Unit/ML: Qty: 1000 | Status: AC

## 2021-06-22 NOTE — Progress Notes (Signed)
CARDIAC REHAB PHASE I   Offered to walk with pt. Pt states 2 walks today already. C/o consistent feelings of vertigo, unrelieved by anything he has tried thus far. Sats 91 on 4L [place], 1100 on IS. Encouraged continued ambulation with emphasis on safety, and IS use. Pt requesting rollator and 3-in-1 for home use. Will continue to follow.  [phone] Reynold Bowen, RN BSN 06/22/2021 2:15 PM

## 2021-06-22 NOTE — Progress Notes (Addendum)
      [address]       [place] [address]             [phone]        3 Days Post-Op Procedure(s) (LRB): CORONARY ARTERY BYPASS GRAFTING TIMES 5 (N/A) TRANSESOPHAGEAL ECHOCARDIOGRAM (TEE) (N/A) APPLICATION OF CELL SAVER ENDOVEIN HARVEST OF GREATER SAPHENOUS VEIN (Right) RADIAL ARTERY HARVEST (Left)  Subjective: Patient had dizziness, nausea and vomiting yesterday. He believes it is vertigo as he has had before. Once he is up walking, which he did 3 times yesterday, he feels better.  Objective: Vital signs in last 24 hours: Temp:  [98.4 F (36.9 C)-98.8 F (37.1 C)] 98.5 F (36.9 C) (10/20 0332) Pulse Rate:  [74-90] 89 (10/20 0332) Cardiac Rhythm: Normal sinus rhythm (10/20 0332) Resp:  [17-24] 18 (10/20 0332) BP: (118-131)/(71-94) 130/81 (10/20 0332) SpO2:  [90 %-92 %] 91 % (10/20 0332) Weight:  [76.5 kg] 76.5 kg (10/20 0529)  Pre op weight  74.8 kg Current Weight  06/22/21 76.5 kg      Intake/Output from previous day: 10/19 0701 - 10/20 0700 In: 237 [P.O.:237] Out: 2475 [Urine:2475]   Physical Exam:  Cardiovascular: RRR, Pulmonary: Diminished bibasilar breath sounds L>R Abdomen: Soft, non tender, bowel sounds present. Extremities: Mild bilateral lower extremity edema. Motor/sensory intact LUE Wounds: Aquacel removed and sternal wound is clean and dry. RLE wound and LUE wounds are clean and dry.  No erythema or signs of infection.  Lab Results: CBC: Recent Labs    06/20/21 1515 06/21/21 0502  WBC 22.6* 15.8*  HGB 13.3 11.4*  HCT 39.9 34.2*  PLT 260 194    BMET:  Recent Labs    06/20/21 1515 06/21/21 0502  NA 133* 147*  K 4.7 4.2  CL 100 108  CO2 21* 25  GLUCOSE 215* 125*  BUN 20 20  CREATININE 1.15 0.92  CALCIUM 8.9 8.5*     PT/INR:  Lab Results  Component Value Date   INR 1.3 (H) 06/19/2021   INR 1.0 06/19/2021   ABG:  INR: Will add last result for INR, ABG once components are confirmed Will add last 4 CBG  results once components are confirmed  Assessment/Plan:  1. CV - S/p NSTEMI. SR. On Lopressor 25 mg bid, Plavix 75 mg daily, Amlodipine 5 mg daily (for radial artery) 2.  Pulmonary - On 4 liters of oxygen via [place]. Wean as able. CXR this am shows no pneumothorax, small left pleural effusion/atelectasis .   Encourage incentive spirometer. 3. Volume Overload - Will give Lasix 40 mg daily 4.  Expected post op acute blood loss anemia - Last H and H 11.4 and 34.2 5. DM-CBGs 179/113/113. Pre op HGA1C 8.4. Will restart Glimepiride and Farxiga closer to discharge. He will require close medical follow up after discharge.  6. Meclizine PRN vertigo. He wants to continue Zofran PRN. 7. LOC constipation 8. Will discharge once dizziness, N/V improved and oxygen weaned or off  Donielle M ZimmermanPA-C 06/22/2021,6:54 AM    Agree with above Continue PT Continue diuresis  Dispo planning  Harini Dearmond O Mikenzie Mccannon

## 2021-06-23 ENCOUNTER — Telehealth (HOSPITAL_COMMUNITY): Payer: Self-pay | Admitting: Pharmacist

## 2021-06-23 LAB — GLUCOSE, CAPILLARY
Glucose-Capillary: 91 mg/dL (ref 70–99)
Glucose-Capillary: 158 mg/dL — ABNORMAL HIGH (ref 70–99)

## 2021-06-23 MED ORDER — ACETAMINOPHEN 500 MG PO TABS: 1000.0000 mg | ORAL_TABLET | Freq: Four times a day (QID) | ORAL | 0 refills | Status: DC | PRN

## 2021-06-23 MED ORDER — CLOPIDOGREL BISULFATE 75 MG PO TABS: 75.0000 mg | ORAL_TABLET | Freq: Every day | ORAL | 3 refills | Status: DC

## 2021-06-23 MED ORDER — ATORVASTATIN CALCIUM 80 MG PO TABS: 80.0000 mg | ORAL_TABLET | Freq: Every day | ORAL | 3 refills | Status: DC

## 2021-06-23 MED ORDER — TRAMADOL HCL 50 MG PO TABS: 50.0000 mg | ORAL_TABLET | Freq: Four times a day (QID) | ORAL | 0 refills | Status: DC | PRN

## 2021-06-23 MED ORDER — METOPROLOL TARTRATE 25 MG PO TABS: 25.0000 mg | ORAL_TABLET | Freq: Two times a day (BID) | ORAL | 3 refills | Status: DC

## 2021-06-23 MED ORDER — ONDANSETRON 8 MG PO TBDP: 8.0000 mg | ORAL_TABLET | Freq: Three times a day (TID) | ORAL | 3 refills | Status: DC | PRN

## 2021-06-23 MED ORDER — AMLODIPINE BESYLATE 5 MG PO TABS
5.0000 mg | ORAL_TABLET | Freq: Every day | ORAL | 1 refills | Status: DC
Start: 2021-06-23 — End: 2021-08-24

## 2021-06-23 NOTE — Progress Notes (Signed)
Inpatient Diabetes Program Recommendations  AACE/ADA: New Consensus Statement on Inpatient Glycemic Control (2015)  Target Ranges:  Prepandial:   less than 140 mg/dL      Peak postprandial:   less than 180 mg/dL (1-2 hours)      Critically ill patients:  140 - 180 mg/dL   Lab Results  Component Value Date   GLUCAP 158 (H) 06/23/2021   HGBA1C 8.4 (H) 06/19/2021    Review of Glycemic Control Results for TAVIUS, TURGEON "NATE" (MRN [id number]) as of 06/23/2021 10:58  Ref. Range 06/22/2021 19:58 06/22/2021 23:41 06/23/2021 04:00 06/23/2021 08:10  Glucose-Capillary Latest Ref Range: 70 - 99 mg/dL 379 (H) 024 (H) 91 097 (H)   Diabetes history: DM 2 Outpatient Diabetes medications:  Metformin 500 mg bid, Farxiga 10 mg daily, Amaryl 4 mg bid Current orders for Inpatient glycemic control:  Novolog 0-24 units q 4 hours  Inpatient Diabetes Program Recommendations:    Spoke with patient and significant other regarding outpatient diabetes management. Verified home medications and plan to take at discharge. Reviewed patient's current A1c of 8.4%. Explained what a A1c is and what it measures. Also reviewed goal A1c with patient, importance of good glucose control @ home, and blood sugar goals. Reviewed patho of DM, impact of heart vasculature in post CABG patients, current trends, frequency for checking cbgs, when to follow up with PCP and other commorbidities. Patient has a meter and supplies. Plans to see PCP and resume home meds. No further questions at this time.  Lujean Rave, MSN, RNC-OB Diabetes Coordinator [phone] (8a-5p)

## 2021-06-23 NOTE — Plan of Care (Signed)

## 2021-06-23 NOTE — Progress Notes (Signed)
CARDIAC REHAB PHASE I   D/c education completed with pt and wife. Pt educated on importance of site care and monitoring incisions daily. Encouraged continued IS use, walks, and sternal precautions. Pt given in-the-tube sheet along with heart healthy and diabetic diets. Reviewed restrictions and exercise guidelines. DME ordered. Pt and wife deny further questions or concerns at this time. Will refer to CRP II [place].   [id number] Reynold Bowen, RN BSN 06/23/2021 10:05 AM

## 2021-06-23 NOTE — Progress Notes (Signed)
      [address]       [place] [address]             [phone]      4 Days Post-Op Procedure(s) (LRB): CORONARY ARTERY BYPASS GRAFTING TIMES 5 (N/A) TRANSESOPHAGEAL ECHOCARDIOGRAM (TEE) (N/A) APPLICATION OF CELL SAVER ENDOVEIN HARVEST OF GREATER SAPHENOUS VEIN (Right) RADIAL ARTERY HARVEST (Left)  Subjective:  Patient up in chair eating breakfast.  Vertigo is still present, but he does state this not as severe as his usual episodes.  He ambulated around the unit twice.  + BM  Objective: Vital signs in last 24 hours: Temp:  [97.8 F (36.6 C)-98.2 F (36.8 C)] 98.2 F (36.8 C) (10/21 0402) Pulse Rate:  [71-92] 81 (10/21 0402) Cardiac Rhythm: Normal sinus rhythm (10/21 0720) Resp:  [14-20] 19 (10/21 0402) BP: (102-139)/(77-83) 127/79 (10/21 0402) SpO2:  [91 %-97 %] 91 % (10/21 0645) Weight:  [75.9 kg] 75.9 kg (10/21 0402)  Intake/Output from previous day: 10/20 0701 - 10/21 0700 In: 903 [P.O.:900; I.V.:3] Out: 1300 [Urine:1300]  General appearance: alert, cooperative, and no distress Heart: regular rate and rhythm Lungs: clear to auscultation bilaterally Abdomen: soft, non-tender; bowel sounds normal; no masses,  no organomegaly Extremities: edema trace Wound: clean and dry  Lab Results: Recent Labs    06/20/21 1515 06/21/21 0502  WBC 22.6* 15.8*  HGB 13.3 11.4*  HCT 39.9 34.2*  PLT 260 194   BMET:  Recent Labs    06/20/21 1515 06/21/21 0502  NA 133* 147*  K 4.7 4.2  CL 100 108  CO2 21* 25  GLUCOSE 215* 125*  BUN 20 20  CREATININE 1.15 0.92  CALCIUM 8.9 8.5*    PT/INR: No results for input(s): LABPROT, INR in the last 72 hours. ABG    Component Value Date/Time   PHART 7.310 (L) 06/19/2021 1708   HCO3 15.1 (L) 06/19/2021 1708   TCO2 16 (L) 06/19/2021 1708   ACIDBASEDEF 10.0 (H) 06/19/2021 1708   O2SAT 95.0 06/19/2021 1708   CBG (last 3)  Recent Labs    06/22/21 1958 06/22/21 2341 06/23/21 0400  GLUCAP 237* 191* 91     Assessment/Plan: S/P Procedure(s) (LRB): CORONARY ARTERY BYPASS GRAFTING TIMES 5 (N/A) TRANSESOPHAGEAL ECHOCARDIOGRAM (TEE) (N/A) APPLICATION OF CELL SAVER ENDOVEIN HARVEST OF GREATER SAPHENOUS VEIN (Right) RADIAL ARTERY HARVEST (Left)  CV- S/P NSTEMI. On Lopressor 25 mg BID, Norvasc 5 mg for radial artery, and Plavix 75 mg daily Pulm- no acute issues, off oxygen, continue IS Renal- creatinine has been stable, minimal edema on exam weight is below admission, with dizziness will not give Lasix on d/c as he doesn't appear volume overloaded Vertigo- chronic, patient states this is very mild compared to usual episodes, he is tolerating a diet, has had no further N/V.  Ambulated around unit w/o difficulty Dispo- patient stable, will d/c home today if okay with Dr. Cliffton Asters   LOS: 9 days   Lowella Dandy, PA-C 06/23/2021

## 2021-06-23 NOTE — TOC Transition Note (Signed)
Transition of Care [facility]) - CM/SW Discharge Note   Patient Details  Name: Matthew Mann MRN: [id number] Date of Birth: [birth year]  Transition of [facility]) CM/SW Contact:  Leone Haven, RN Phone Number: 06/23/2021, 9:37 AM   Clinical Narrative:    Patient is for dc today, he will need 3 n 1 and rollator.  NCM has made referral to [employer] for the supplies, he is ok with them supplying the DME. They will be brought down to patient 's room.    Final next level of care: Home/Self Care Barriers to Discharge: No Barriers Identified   Patient Goals and CMS Choice Patient states their goals for this hospitalization and ongoing recovery are:: return home   Choice offered to / list presented to : NA  Discharge Placement                       Discharge Plan and Services                DME Arranged: Walker rolling with seat, 3-N-1 DME Agency: [employer]       HH Arranged: NA          Social Determinants of Health (SDOH) Interventions     Readmission Risk Interventions No flowsheet data found.

## 2021-06-23 NOTE — Progress Notes (Signed)
Discharge instructions given to patient with wife at bedside. Patient understands follow up instructions, new medication changes and follow up appointments.  1 PIV removed without complication. Patient in room awaiting rollator and 3in1 for home.

## 2021-06-23 NOTE — Telephone Encounter (Signed)
Hello Matthew Mann,   The Pharmacy team is conducting a discharge transitions of care quality improvement initiative. The recommendations below are for your consideration.     Matthew Mann is a 55 y.o. male (MRN: [id number], DOB: 08-15-1966) who was recently hospitalized on 06/14/2021 for NSTEMI. They are anticipated to visit your clinic for post-discharge follow-up and may benefit from assistance with medication initiation and/or access.       Please consider the following therapy recommendations at follow-up appointment below:   -He was on losartan at home and he could benefit from re-starting with NSTEMI and hx DM. During admission, metoprolol was added, Norvasc was decreased to 5mg  daily and hydrochlorothiazide was discontinued.  Consider adding back losartan as outpatient if blood pressure allows -Recheck lipid panel in 8-12 weeks (statin increased 06/14/2021)     We appreciate your assistance with the implementation of these recommendations. Please let 08/14/2021 know if there is anything we can help you with at this time.      Thank you, [place], PharmD Clinical Pharmacist **Pharmacist phone directory can now be found on [email or web address] ([email or web address]).  Listed under [facility].

## 2021-06-23 NOTE — Plan of Care (Signed)

## 2021-06-30 ENCOUNTER — Telehealth: Payer: Self-pay

## 2021-06-30 ENCOUNTER — Other Ambulatory Visit: Payer: Self-pay

## 2021-06-30 ENCOUNTER — Ambulatory Visit (INDEPENDENT_AMBULATORY_CARE_PROVIDER_SITE_OTHER): Payer: Self-pay | Admitting: Thoracic Surgery (Cardiothoracic Vascular Surgery)

## 2021-06-30 DIAGNOSIS — Z951 Presence of aortocoronary bypass graft: Secondary | ICD-10-CM

## 2021-06-30 MED ORDER — TRAMADOL HCL 50 MG PO TABS: 50.0000 mg | ORAL_TABLET | Freq: Four times a day (QID) | ORAL | 0 refills | Status: DC | PRN

## 2021-06-30 NOTE — Telephone Encounter (Signed)
Transition Care Management Unsuccessful Follow-up Telephone Call  Date of discharge and from where:  10/21  [facility]  Attempts:  1st Attempt  Reason for unsuccessful TCM follow-up call:  No answer/busy  Rowe Pavy, RN, BSN, CEN [facility] Coordinator [phone]

## 2021-06-30 NOTE — Progress Notes (Signed)
     [address]       [place] [address]             [phone]       Patient: Home Provider: Office Consent for Telemedicine visit obtained.  Today's visit was completed via a real-time telehealth (see specific modality noted below). The patient/authorized person provided oral consent at the time of the visit to engage in a telemedicine encounter with the present provider at [facility]. The patient/authorized person was informed of the potential benefits, limitations, and risks of telemedicine. The patient/authorized person expressed understanding that the laws that protect confidentiality also apply to telemedicine. The patient/authorized person acknowledged understanding that telemedicine does not provide emergency services and that he or she would need to call 911 or proceed to the nearest hospital for help if such a need arose.   Total time spent in the clinical discussion 10 minutes.  Telehealth Modality: Phone visit (audio only)  I had a telephone visit with Mr. Vossler.  He is status post CABG.  Overall he is doing well.  He is weaning himself off of the tramadol and using mostly Tylenol.  I have given him 1 more refill of tramadol.  He is ambulating in the house without much difficulty.  His incisions are all healing well.  He will follow-up in 1 month with a chest x-ray.  If that is clear he can be discharged to cardiac rehab and resume all normal activity.

## 2021-07-03 ENCOUNTER — Telehealth: Payer: Self-pay

## 2021-07-03 NOTE — Telephone Encounter (Signed)
Transition Care Management Unsuccessful Follow-up Telephone Call  Date of discharge and from where:  06/23/21 from [facility]  Attempts:  2nd Attempt  Reason for unsuccessful TCM follow-up call:  Left voice message    Kathyrn Sheriff, RN, MSN, BSN, CCM [facility] Care Management Coordinator [phone]

## 2021-07-10 NOTE — Progress Notes (Signed)
Cardiology Clinic Note   Patient Name: Matthew Mann Date of Encounter: 07/12/2021  Primary Care Provider:  Loman Brooklyn, FNP Primary Cardiologist:  Evalina Field, MD  Patient Profile    Matthew Mann 55 year old male presents to the clinic today for follow-up evaluation status post CABG x5 (06/19/2021).  Past Medical History    Past Medical History:  Diagnosis Date   Diabetes mellitus without complication ([place])    Hyperlipidemia    Hypertension    Past Surgical History:  Procedure Laterality Date   CORONARY ARTERY BYPASS GRAFT N/A 06/19/2021   Procedure: CORONARY ARTERY BYPASS GRAFTING TIMES 5;  Surgeon: Lajuana Matte, MD;  Location: [place];  Service: Open Heart Surgery;  Laterality: N/A;   ENDOVEIN HARVEST OF GREATER SAPHENOUS VEIN Right 06/19/2021   Procedure: ENDOVEIN HARVEST OF GREATER SAPHENOUS VEIN;  Surgeon: Lajuana Matte, MD;  Location: [place];  Service: Open Heart Surgery;  Laterality: Right;   LEFT HEART CATH AND CORONARY ANGIOGRAPHY N/A 06/14/2021   Procedure: LEFT HEART CATH AND CORONARY ANGIOGRAPHY;  Surgeon: Sherren Mocha, MD;  Location: [place] [facility];  Service: Cardiovascular;  Laterality: N/A;   RADIAL ARTERY HARVEST Left 06/19/2021   Procedure: RADIAL ARTERY HARVEST;  Surgeon: Lajuana Matte, MD;  Location: [place];  Service: Open Heart Surgery;  Laterality: Left;   TEE WITHOUT CARDIOVERSION N/A 06/19/2021   Procedure: TRANSESOPHAGEAL ECHOCARDIOGRAM (TEE);  Surgeon: Lajuana Matte, MD;  Location: [place];  Service: Open Heart Surgery;  Laterality: N/A;    Allergies  Allergies  Allergen Reactions   Ace Inhibitors Swelling and Other (See Comments)    Tongue swelling and cough.     History of Present Illness    Matthew Mann has a PMH of coronary artery disease status post CABG times 5 on 06/1721 by Dr. Kipp Brood.  His PMH also includes diabetes, hypertension, hyperlipidemia.  He was admitted with NSTEMI 06/14/2021.  His  cardiac catheterization showed severe two-vessel coronary artery disease.  CT surgery was consulted for CABG.  He underwent CABG x5 by Dr. Kipp Brood on 06/19/2021.  He tolerated the surgery well and was taken to the surgical intensive care unit in stable condition.  He was extubated without difficulty.  He remained hemodynamically stable.  His chest tubes were removed on postop day 2.  Norvasc was added to his medication regimen as adjunctive therapy for radial artery harvest.  He was placed on Plavix for ACS on admission.  He continued to be surgically stable and is transferred from ICU to [facility] on 06/20/2021.  He continued to maintain sinus rhythm.  A follow-up chest x-ray 10/20 showed trace left apical pneumothorax.  His sternal incision, LAE, RLE incisions were clean dry intact and healing well without signs of infection.  His diet was advanced and tolerated well.  He had complaints of dizziness, nausea, and vomiting.  He reported a history of vertigo in the past.  He was prescribed as needed meclizine.  His dizziness persisted.  He was able to ambulate without issues.  At discharge she had no further nausea/vomiting.  He was tolerating his diet well and was discharged in stable condition on 06/20/2021.  He presents to the clinic today for follow-up evaluation states he continues to increase his physical activity.  He has been walking multiple times per day for about 15 minutes.  He does note some left sided discomfort with laying and sitting in his recliner.  We reviewed his 06/22/2021 CXR.  He and his  wife expressed understanding.  We reviewed his EKG today which shows normal sinus rhythm 83 bpm.  We reviewed the importance of increased physical activity, continuing to use his incentive spirometer, and maintain sternal precautions.  We will repeat his fasting lipids and LFTs in  4 weeks.  He wishes to complete this lab work at his PCP.  We will plan to see him back in 3 to 4 months.  Today he denies chest  pain, shortness of breath, lower extremity edema, fatigue, palpitations, melena, hematuria, hemoptysis, diaphoresis, weakness, presyncope, syncope, orthopnea, and PND.   Home Medications    Prior to Admission medications   Medication Sig Start Date End Date Taking? Authorizing Provider  acetaminophen (TYLENOL) 500 MG tablet Take 2 tablets (1,000 mg total) by mouth every 6 (six) hours as needed. 06/23/21   Barrett, Erin R, PA-C  amLODipine (NORVASC) 5 MG tablet Take 1 tablet (5 mg total) by mouth daily. 06/23/21   Barrett, Rae Roam, PA-C  aspirin 81 MG EC tablet Take 1 tablet by mouth daily.    [provider]  atorvastatin (LIPITOR) 80 MG tablet Take 1 tablet (80 mg total) by mouth daily. 06/23/21   Barrett, Rae Roam, PA-C  clopidogrel (PLAVIX) 75 MG tablet Take 1 tablet (75 mg total) by mouth daily. 06/23/21   Barrett, Erin R, PA-C  dapagliflozin propanediol (FARXIGA) 10 MG TABS tablet Take 1 tablet (10 mg total) by mouth daily. 05/31/21   Gwenlyn Fudge, FNP  glimepiride (AMARYL) 4 MG tablet Take 1 tablet (4 mg total) by mouth in the morning and at bedtime. 02/21/21   Gwenlyn Fudge, FNP  metFORMIN (GLUCOPHAGE-XR) 500 MG 24 hr tablet TAKE 1 TABLET BY MOUTH TWICE A DAY 06/20/21   Gwenlyn Fudge, FNP  metoprolol tartrate (LOPRESSOR) 25 MG tablet Take 1 tablet (25 mg total) by mouth 2 (two) times daily. 06/23/21   Barrett, Erin R, PA-C  ondansetron (ZOFRAN ODT) 8 MG disintegrating tablet Take 1 tablet (8 mg total) by mouth every 8 (eight) hours as needed for nausea or vomiting. 06/23/21   Barrett, Erin R, PA-C  traMADol (ULTRAM) 50 MG tablet Take 1 tablet (50 mg total) by mouth every 6 (six) hours as needed for moderate pain. 06/30/21   Corliss Skains, MD    Family History    Family History  Problem Relation Age of Onset   Hypertension Mother    Heart disease Sister    Heart attack Maternal Grandfather    He indicated that his mother is deceased. He indicated that his father  is deceased. He indicated that his sister is alive. He indicated that his brother is alive. He indicated that his maternal grandmother is deceased. He indicated that his maternal grandfather is deceased. He indicated that his paternal grandmother is deceased. He indicated that his paternal grandfather is deceased. He indicated that his son is alive.  Social History    Social History   Socioeconomic History   Marital status: Married    Spouse name: Not on file   Number of children: Not on file   Years of education: Not on file   Highest education level: Not on file  Occupational History   Not on file  Tobacco Use   Smoking status: Never   Smokeless tobacco: Never  Vaping Use   Vaping Use: Never used  Substance and Sexual Activity   Alcohol use: Never   Drug use: Never   Sexual activity: Yes  Other Topics Concern  Not on file  Social History Narrative   Not on file   Social Determinants of Health   Financial Resource Strain: Not on file  Food Insecurity: Not on file  Transportation Needs: Not on file  Physical Activity: Not on file  Stress: Not on file  Social Connections: Not on file  Intimate Partner Violence: Not on file     Review of Systems    General:  No chills, fever, night sweats or weight changes.  Cardiovascular:  No chest pain, dyspnea on exertion, edema, orthopnea, palpitations, paroxysmal nocturnal dyspnea. Dermatological: No rash, lesions/masses Respiratory: No cough, dyspnea Urologic: No hematuria, dysuria Abdominal:   No nausea, vomiting, diarrhea, bright red blood per rectum, melena, or hematemesis Neurologic:  No visual changes, wkns, changes in mental status. All other systems reviewed and are otherwise negative except as noted above.  Physical Exam    VS:  BP 130/84   Pulse 83   Ht 5\' 7"  (1.702 m)   Wt 166 lb 6.4 oz (75.5 kg)   SpO2 99%   BMI 26.06 kg/m  , BMI Body mass index is 26.06 kg/m. GEN: Well nourished, well developed, in no  acute distress. HEENT: normal. Neck: Supple, no JVD, carotid bruits, or masses. Cardiac: RRR, no murmurs, rubs, or gallops. No clubbing, cyanosis, edema.  Radials/DP/PT 2+ and equal bilaterally.  Respiratory:  Respirations regular and unlabored, clear to auscultation bilaterally. GI: Soft, nontender, nondistended, BS + x 4. MS: no deformity or atrophy. Skin: warm and dry, no rash.  Surgical incisions healing well, well approximated, no signs of infection Neuro:  Strength and sensation are intact. Psych: Normal affect.  Accessory Clinical Findings    Recent Labs: 04/27/2021: ALT 30 06/20/2021: Magnesium 2.5 06/21/2021: BUN 20; Creatinine, Ser 0.92; Hemoglobin 11.4; Platelets 194; Potassium 4.2; Sodium 147   Recent Lipid Panel    Component Value Date/Time   CHOL 126 06/15/2021 0621   CHOL 153 04/27/2021 1015   TRIG 88 06/15/2021 0621   HDL 35 (L) 06/15/2021 0621   HDL 37 (L) 04/27/2021 1015   CHOLHDL 3.6 06/15/2021 0621   VLDL 18 06/15/2021 0621   LDLCALC 73 06/15/2021 0621   LDLCALC 91 04/27/2021 1015    ECG personally reviewed by me today-normal sinus rhythm nonspecific T wave abnormality 83 bpm- No acute changes  Echocardiogram 06/15/2021 IMPRESSIONS     1. There appears to be fusion of left and right coronary cusps with  functionally bicuspid aortic valve; trace AI; mild AS (mean gradient 11  mmHg).   2. Left ventricular ejection fraction, by estimation, is 60 to 65%. The  left ventricle has normal function. The left ventricle has no regional  wall motion abnormalities. Left ventricular diastolic parameters are  consistent with Grade I diastolic  dysfunction (impaired relaxation).   3. Right ventricular systolic function is normal. The right ventricular  size is normal. Tricuspid regurgitation signal is inadequate for assessing  PA pressure.   4. The mitral valve is normal in structure. Trivial mitral valve  regurgitation. No evidence of mitral stenosis.   5. The  aortic valve is bicuspid. Aortic valve regurgitation is trivial.  Mild aortic valve stenosis.   6. The inferior vena cava is normal in size with greater than 50%  respiratory variability, suggesting right atrial pressure of 3 mmHg.   Cardiac catheterization 06/14/2021 1.  Left dominant coronary system with patent left main, severe diffuse mid LAD stenosis, severe mid circumflex stenosis, severe left PDA stenosis, severe diagonal stenosis. 2.  Nondominant RCA with moderate diffuse plaquing 3.  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  We will resume heparin and consult cardiac surgery for consideration of CABG.  His targets are suboptimal and if he is not felt to have suitable anatomy for CABG, he could be treated with targeted PCI and ongoing medical therapy.  If his targets are felt to be suitable for grafting, he would be more completely revascularized with CABG  Diagnostic Dominance: Left Intervention   Assessment & Plan   1.  Coronary artery disease-no chest discomfort.  Reports surgical site soreness.  Sternal incision and graft sites clean dry intact and well approximated.  No signs of infection.  Underwent CABG on 06/19/2021 by Dr. Kipp Brood. Continue aspirin, atorvastatin, Plavix, metoprolol Heart healthy low-sodium diet-salty 6 given Increase physical activity as tolerated-maintain sternal precautions  Hyperlipidemia-06/15/2021: Cholesterol 126; HDL 35; LDL Cholesterol 73; Triglycerides 88; VLDL 18 Continue aspirin, atorvastatin Heart healthy low-sodium high-fiber diet. Increase physical activity as tolerated-maintain sternal precautions Repeat fasting lipids and LFTs in 4 weeks.  Essential hypertension-BP today 130/84.  Well-controlled at home. Continue amlodipine, metoprolol Heart healthy low-sodium diet-salty 6 given Increase physical activity as  tolerated-maintain sternal precautions  Type 2 diabetes-glucose 125 on 06/21/2021. Continue metformin, Farxiga, Amaryl Heart healthy low-sodium carb modified diet Follows with PCP   Disposition: Follow-up with Dr. Audie Box or me in 3-4 months.  Jossie Ng. Nathanuel Cabreja NP-C    07/12/2021, 10:57 AM [address] [place] [address] Office [phone] Fax [phone]  Notice: This dictation was prepared with Dragon dictation along with smaller phrase technology. Any transcriptional errors that result from this process are unintentional and may not be corrected upon review.  I spent 14 minutes examining this patient, reviewing medications, and using patient centered shared decision making involving her cardiac care.  Prior to her visit I spent greater than 20 minutes reviewing her past medical history,  medications, and prior cardiac tests.

## 2021-07-12 ENCOUNTER — Other Ambulatory Visit: Payer: Self-pay

## 2021-07-12 ENCOUNTER — Encounter: Payer: Self-pay | Admitting: General Practice

## 2021-07-12 ENCOUNTER — Ambulatory Visit (INDEPENDENT_AMBULATORY_CARE_PROVIDER_SITE_OTHER): Payer: 59 | Admitting: General Practice

## 2021-07-12 VITALS — BP 130/84 | HR 83 | Ht 67.0 in | Wt 166.4 lb

## 2021-07-12 DIAGNOSIS — Z951 Presence of aortocoronary bypass graft: Secondary | ICD-10-CM

## 2021-07-12 DIAGNOSIS — E782 Mixed hyperlipidemia: Secondary | ICD-10-CM | POA: Diagnosis not present

## 2021-07-12 DIAGNOSIS — E1141 Type 2 diabetes mellitus with diabetic mononeuropathy: Secondary | ICD-10-CM | POA: Diagnosis not present

## 2021-07-12 DIAGNOSIS — Z79899 Other long term (current) drug therapy: Secondary | ICD-10-CM

## 2021-07-12 DIAGNOSIS — I10 Essential (primary) hypertension: Secondary | ICD-10-CM

## 2021-07-12 NOTE — Patient Instructions (Signed)
Medication Instructions:  The current medical regimen is effective;  continue present plan and medications as directed. Please refer to the Current Medication list given to you today.  *If you need a refill on your cardiac medications before your next appointment, please call your pharmacy*  Lab Work: FASTING LIPID AND LFT If you have labs (blood work) drawn today and your tests are completely normal, you will receive your results only by:  MyChart Message (if you have MyChart) OR A paper copy in the mail.  If you have any lab test that is abnormal or we need to change your treatment, we will call you to review the results. You may go to any [facility] that is convenient for you however, we do have a lab in our office that is able to assist you. You DO NOT need an appointment for our lab. The lab is open 8:00am and closes at 4:00pm. Lunch 12:45 - 1:45pm.  Testing/Procedures: NONE  Special Instructions PLEASE CONTINUE STERNAL PRECAUTIONS  MAKE SURE TO USE YOUR INCENTIVE SPIROMETER MULTIPLE TIMES DAILY  TAKE AND LOG YOUR BLOOD PRESSURE-AT LEAST 1 HOUR AFTER TAKING YOU MEDICATIONS  Follow-Up: Your next appointment:  3-4 month(s) In Person with Reatha Harps, MD  or Edd Fabian, FNP       At [facility], you and your health needs are our priority.  As part of our continuing mission to provide you with exceptional heart care, we have created designated Provider Care Teams.  These Care Teams include your primary Cardiologist (physician) and Advanced Practice Providers (APPs -  Physician Assistants and Nurse Practitioners) who all work together to provide you with the care you need, when you need it.

## 2021-07-15 ENCOUNTER — Other Ambulatory Visit: Payer: Self-pay | Admitting: Physician Assistant

## 2021-07-19 ENCOUNTER — Other Ambulatory Visit: Payer: Self-pay | Admitting: Thoracic Surgery (Cardiothoracic Vascular Surgery)

## 2021-07-19 DIAGNOSIS — Z951 Presence of aortocoronary bypass graft: Secondary | ICD-10-CM

## 2021-07-24 ENCOUNTER — Other Ambulatory Visit: Payer: Self-pay | Admitting: *Deleted

## 2021-07-24 ENCOUNTER — Ambulatory Visit (INDEPENDENT_AMBULATORY_CARE_PROVIDER_SITE_OTHER): Payer: Self-pay | Admitting: Physician Assistant

## 2021-07-24 ENCOUNTER — Other Ambulatory Visit: Payer: Self-pay

## 2021-07-24 ENCOUNTER — Ambulatory Visit
Admission: RE | Admit: 2021-07-24 | Discharge: 2021-07-24 | Disposition: A | Discharge status: Home / Self Care | Payer: 59 | Source: Ambulatory Visit | Attending: Thoracic Surgery (Cardiothoracic Vascular Surgery) | Admitting: Thoracic Surgery (Cardiothoracic Vascular Surgery)

## 2021-07-24 VITALS — BP 155/91 | HR 85 | Resp 20 | Ht 67.0 in | Wt 168.0 lb

## 2021-07-24 DIAGNOSIS — Z951 Presence of aortocoronary bypass graft: Secondary | ICD-10-CM

## 2021-07-24 NOTE — Patient Instructions (Signed)
You may return to driving an automobile as long as you are no longer requiring oral narcotic pain relievers during the daytime.  It would be wise to start driving only short distances during the daylight and gradually increase from there as you feel comfortable.   Make every effort to keep your diabetes under very tight control.  Follow up closely with your primary care physician or endocrinologist and strive to keep their hemoglobin A1c levels as low as possible, preferably near or below 6.0.  The long term benefits of strict control of diabetes are far reaching and critically important for your overall health and survival.   You may continue to gradually increase your physical activity as tolerated.  Refrain from any heavy lifting or strenuous use of your arms and shoulders until at least 8 weeks from the time of your surgery, and avoid activities that cause increased pain in your chest on the side of your surgical incision.  Otherwise you may continue to increase activities without any particular limitations.  Increase the intensity and duration of physical activity gradually.   You are encouraged to enroll and participate in the outpatient cardiac rehab program beginning as soon as practical.

## 2021-07-24 NOTE — Progress Notes (Signed)
[address]       [place] [address]             [phone]       HPI: This is a 55 year old male with a past medical history of type 2 diabetes, hypertension, dyslipidemia, and a family history for premature coronary artery disease. Patient returns for routine postoperative follow-up having a NSTEMI and undergone a CABG X 5 (LIMA LAD, Radial artery to OM, RSVG to D1, D2, PDA), endoscopic greater saphenous vein harvest on the right and left open radial artery harvest on 06/19/2021 by Dr. Cliffton Asters. He had a VIRTUAL appointment with Dr. Cliffton Asters on 06/30/2021. At that time, patient was recovering well from coronary artery bypass grafting surgery. His only complaints are difficulty falling asleep and a pain on left side of sternum that is wore in the am but improves throughout the day.  Current Outpatient Medications  Medication Sig Dispense Refill   acetaminophen (TYLENOL) 500 MG tablet Take 2 tablets (1,000 mg total) by mouth every 6 (six) hours as needed. 30 tablet 0   amLODipine (NORVASC) 5 MG tablet Take 1 tablet (5 mg total) by mouth daily. 30 tablet 1   aspirin 81 MG EC tablet Take 1 tablet by mouth daily.     atorvastatin (LIPITOR) 80 MG tablet Take 1 tablet (80 mg total) by mouth daily. 30 tablet 3   clopidogrel (PLAVIX) 75 MG tablet Take 1 tablet (75 mg total) by mouth daily. 30 tablet 3   dapagliflozin propanediol (FARXIGA) 10 MG TABS tablet Take 1 tablet (10 mg total) by mouth daily. 90 tablet 0   glimepiride (AMARYL) 4 MG tablet Take 1 tablet (4 mg total) by mouth in the morning and at bedtime. 180 tablet 1   metFORMIN (GLUCOPHAGE-XR) 500 MG 24 hr tablet TAKE 1 TABLET BY MOUTH TWICE A DAY 180 tablet 0   metoprolol tartrate (LOPRESSOR) 25 MG tablet Take 1 tablet (25 mg total) by mouth 2 (two) times daily. 60 tablet 3  Vital Signs: Vitals:   07/24/21 1405  BP: (!) 155/91  Pulse: 85  Resp: 20  SpO2: 99%    Physical Exam: CV-RRR Pulmonary-Clear to  auscultation bilaterally Abdomen-Soft,non tender, bowel sounds present Extremities-No LE edema. LUE with small area of numbness by wrist (not thumb) Wounds-Eschars removed from sternal wound. RUE and LUE wounds are clean and dry  Diagnostic Tests:   CLINICAL DATA:  A 55 year old male presents for post CABG follow-up. No reported complaints at this time.   EXAM: CHEST - 2 VIEW   COMPARISON:  October of 2022.   FINDINGS: Post median sternotomy for CABG. Lungs are clear. No effusion. No pneumothorax.   On limited assessment there is no acute skeletal process.   IMPRESSION: Post median sternotomy without acute cardiopulmonary disease.     Electronically Signed   By: Donzetta Kohut M.D.   On: 07/24/2021 13:50    Impression and Plan: He was seen by cardiology NP on 07/12/2021. There were no changes made to his medications at that visit. He is going to keep a log of his BP. If the systolic is consistently 140 or higher, he will contact cardiology or our office. He has an appointment to see Dr. Flora Lipps in March. He has a history of diabetes mellitus and his pre op HGA1C was 8.4. He has an appointment for follow up of this in am. I also instructed him to take baby ec asa 81 mg  daily while on Plavix. Patient was instructed to continue with sternal precautions (I.e. no lifting more than 10 pounds) for the next 4 weeks.  As long as the patient is NOT taking any narcotic for pain, he may begin driving short distances (I.e. 30 minutes  or less during the day) and may gradually increase the frequency and duration as tolerates. We encourage the  Patient to participate in cardiac rehab;he does wish to do so and he was referred to [facility]. Regarding left wrist numbness, related to harvest of left radial artery and hopefully will continue to improve with time. His pain to left of sternum is likely related to the surgery as well;either from takedown of LIMA or using retractor and spreading ribs at  the time of surgery. Again, hopefully, this will improved with more time. We discussed over the counter sleep aid (Benadryl, ZZZ Quill, Tylenol PM, Melatonin for short duration). 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 He was fairly tired but otherwise, no complaint. He will return PRN.     Ardelle Balls, PA-C [facility] [phone]

## 2021-07-24 NOTE — Progress Notes (Unsigned)
b

## 2021-07-25 ENCOUNTER — Ambulatory Visit (INDEPENDENT_AMBULATORY_CARE_PROVIDER_SITE_OTHER): Payer: 59 | Admitting: Family Medicine

## 2021-07-25 ENCOUNTER — Other Ambulatory Visit: Payer: Self-pay | Admitting: Family Medicine

## 2021-07-25 ENCOUNTER — Encounter: Payer: Self-pay | Admitting: Family Medicine

## 2021-07-25 VITALS — BP 151/92 | HR 82 | Temp 97.4°F | Ht 67.0 in | Wt 168.0 lb

## 2021-07-25 DIAGNOSIS — E1141 Type 2 diabetes mellitus with diabetic mononeuropathy: Secondary | ICD-10-CM | POA: Diagnosis not present

## 2021-07-25 DIAGNOSIS — I10 Essential (primary) hypertension: Secondary | ICD-10-CM | POA: Diagnosis not present

## 2021-07-25 DIAGNOSIS — G479 Sleep disorder, unspecified: Secondary | ICD-10-CM | POA: Diagnosis not present

## 2021-07-25 DIAGNOSIS — E782 Mixed hyperlipidemia: Secondary | ICD-10-CM

## 2021-07-25 DIAGNOSIS — Z1211 Encounter for screening for malignant neoplasm of colon: Secondary | ICD-10-CM

## 2021-07-25 LAB — BAYER DCA HB A1C WAIVED: HB A1C (BAYER DCA - WAIVED): 7 % — ABNORMAL HIGH (ref 4.8–5.6)

## 2021-07-25 MED ORDER — OZEMPIC (0.25 OR 0.5 MG/DOSE) 2 MG/1.5ML ~~LOC~~ SOPN: 0.2500 mg | PEN_INJECTOR | SUBCUTANEOUS | 0 refills | Status: DC

## 2021-07-25 MED ORDER — TRAZODONE HCL 50 MG PO TABS: 50.0000 mg | ORAL_TABLET | Freq: Every evening | ORAL | 3 refills | Status: DC | PRN

## 2021-07-25 NOTE — Progress Notes (Signed)
Assessment & Plan:  1. Type 2 diabetes mellitus with diabetic mononeuropathy, without long-term current use of insulin (HCC) Lab Results  Component Value Date   HGBA1C 7.0 (H) 07/25/2021   HGBA1C 8.4 (H) 06/19/2021   HGBA1C 8.9 (H) 04/27/2021    - Diabetes is not at goal of A1c < 7. - Medications: continue current medications, add Ozempic 0.25 mg once weekly. Discussed we can work on discontinuing oral medications as we increase Ozempic and that I would start with glimepiride. - Home glucose monitoring: continue monitoring. - Patient is currently taking a statin. Patient is not taking an ACE-inhibitor/ARB.  - Instruction/counseling given: reminded to get eye exam and discussed diet  Diabetes Health Maintenance Due  Topic Date Due   OPHTHALMOLOGY EXAM  Never done   URINE MICROALBUMIN  Never done   HEMOGLOBIN A1C  01/22/2022   FOOT EXAM  02/21/2022    No results found for: LABMICR, MICROALBUR  - Bayer DCA Hb A1c Waived - CBC with Differential/Platelet - CMP14+EGFR - Lipid panel - Microalbumin / creatinine urine ratio - Semaglutide,0.25 or 0.5MG/DOS, (OZEMPIC, 0.25 OR 0.5 MG/DOSE,) 2 MG/1.5ML SOPN; Inject 0.25 mg into the skin once a week.  Dispense: 1.5 mL; Refill: 0  2. Essential hypertension High here, but controlled per home readings. - CBC with Differential/Platelet - CMP14+EGFR - Lipid panel  3. Mixed hyperlipidemia Labs to assess. - CMP14+EGFR - Lipid panel  4. Difficulty sleeping Started Trazodone. - traZODone (DESYREL) 50 MG tablet; Take 1-2 tablets (50-100 mg total) by mouth at bedtime as needed for sleep.  Dispense: 30 tablet; Refill: 3  5. Colon cancer screening - Cologuard   Return in about 6 weeks (around 09/05/2021) for DM.  Hendricks Limes, MSN, APRN, FNP-C [facility]  Subjective:    Patient ID: Matthew Mann, male    DOB: October 19, 1965, 55 y.o.   MRN: [id number]  Patient Care Team: Loman Brooklyn, FNP as PCP - General (Family  Medicine) O'Neal, Cassie Freer, MD as PCP - Cardiology (Cardiology) [facility]   Chief Complaint:  Chief Complaint  Patient presents with   Medical Management of Chronic Issues    HPI: Matthew Mann is a 55 y.o. male presenting on 07/25/2021 for Medical Management of Chronic Issues  Diabetes with hypertension and hyperlipidemia: Patient presents for follow up of diabetes. Current symptoms include: hyperglycemia. Known diabetic complications: cardiovascular disease. Medication compliance: taking Farxiga 10 mg QD, glimepiride 4 mg daily (not BID), and metformin 1,000 mg daily. Current diet: in general, a "healthy" diet  . Current exercise:  about to start cardiac rehab . Home blood sugar records: BGs range between 120 and 170 . Is he  on ACE inhibitor or angiotensin II receptor blocker? No - he was previously taking Losartan; this was held while he was in the hospital last month. Is he on a statin? Yes (Atorvastatin).   Hypertension: patient does check his blood pressure at home and reports readings 120-130/70-85. States he is always higher at the doctor's office. He was 119/91 at home this morning.  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 Since that time he has been having a hard time sleeping; no issues prior to this. He has tried taking Tylenol PM which was not helpful. He has been taking Tylenol for pain at incision/surgery sites, which is improving.    Social history:  Relevant past medical, surgical, family and social history reviewed and updated as indicated. Interim medical history  since our last visit reviewed.  Allergies and medications reviewed and updated.  DATA REVIEWED: CHART IN EPIC  ROS: Negative unless specifically indicated above in HPI.    Current Outpatient Medications:    acetaminophen (TYLENOL) 500 MG tablet, Take 2 tablets (1,000 mg total) by mouth every 6 (six) hours as needed., Disp: 30 tablet, Rfl: 0   amLODipine (NORVASC)  5 MG tablet, Take 1 tablet (5 mg total) by mouth daily., Disp: 30 tablet, Rfl: 1   aspirin 81 MG EC tablet, Take 1 tablet by mouth daily., Disp: , Rfl:    atorvastatin (LIPITOR) 80 MG tablet, Take 1 tablet (80 mg total) by mouth daily., Disp: 30 tablet, Rfl: 3   clopidogrel (PLAVIX) 75 MG tablet, Take 1 tablet (75 mg total) by mouth daily., Disp: 30 tablet, Rfl: 3   dapagliflozin propanediol (FARXIGA) 10 MG TABS tablet, Take 1 tablet (10 mg total) by mouth daily., Disp: 90 tablet, Rfl: 0   glimepiride (AMARYL) 4 MG tablet, Take 1 tablet (4 mg total) by mouth in the morning and at bedtime., Disp: 180 tablet, Rfl: 1   metFORMIN (GLUCOPHAGE-XR) 500 MG 24 hr tablet, TAKE 1 TABLET BY MOUTH TWICE A DAY, Disp: 180 tablet, Rfl: 0   metoprolol tartrate (LOPRESSOR) 25 MG tablet, Take 1 tablet (25 mg total) by mouth 2 (two) times daily., Disp: 60 tablet, Rfl: 3   Allergies  Allergen Reactions   Ace Inhibitors Swelling and Other (See Comments)    Tongue swelling and cough.    Past Medical History:  Diagnosis Date   Diabetes mellitus without complication ([place])    Hyperlipidemia    Hypertension     Past Surgical History:  Procedure Laterality Date   CORONARY ARTERY BYPASS GRAFT N/A 06/19/2021   Procedure: CORONARY ARTERY BYPASS GRAFTING TIMES 5;  Surgeon: Lajuana Matte, MD;  Location: [place];  Service: Open Heart Surgery;  Laterality: N/A;   ENDOVEIN HARVEST OF GREATER SAPHENOUS VEIN Right 06/19/2021   Procedure: ENDOVEIN HARVEST OF GREATER SAPHENOUS VEIN;  Surgeon: Lajuana Matte, MD;  Location: [place];  Service: Open Heart Surgery;  Laterality: Right;   LEFT HEART CATH AND CORONARY ANGIOGRAPHY N/A 06/14/2021   Procedure: LEFT HEART CATH AND CORONARY ANGIOGRAPHY;  Surgeon: Sherren Mocha, MD;  Location: [place] [facility];  Service: Cardiovascular;  Laterality: N/A;   RADIAL ARTERY HARVEST Left 06/19/2021   Procedure: RADIAL ARTERY HARVEST;  Surgeon: Lajuana Matte, MD;  Location:  [place];  Service: Open Heart Surgery;  Laterality: Left;   TEE WITHOUT CARDIOVERSION N/A 06/19/2021   Procedure: TRANSESOPHAGEAL ECHOCARDIOGRAM (TEE);  Surgeon: Lajuana Matte, MD;  Location: [place];  Service: Open Heart Surgery;  Laterality: N/A;    Social History   Socioeconomic History   Marital status: Married    Spouse name: Not on file   Number of children: Not on file   Years of education: Not on file   Highest education level: Not on file  Occupational History   Not on file  Tobacco Use   Smoking status: Never   Smokeless tobacco: Never  Vaping Use   Vaping Use: Never used  Substance and Sexual Activity   Alcohol use: Never   Drug use: Never   Sexual activity: Yes  Other Topics Concern   Not on file  Social History Narrative   Not on file   Social Determinants of Health   Financial Resource Strain: Not on file  Food Insecurity: Not on file  Transportation Needs: Not on file  Physical Activity: Not on file  Stress: Not on file  Social Connections: Not on file  Intimate Partner Violence: Not on file        Objective:    BP (!) 153/93   Pulse 82   Temp (!) 97.4 F (36.3 C)   Ht '5\' 7"'  (1.702 m)   Wt 168 lb (76.2 kg)   SpO2 100%   BMI 26.31 kg/m   Wt Readings from Last 3 Encounters:  07/25/21 168 lb (76.2 kg)  07/24/21 168 lb (76.2 kg)  07/12/21 166 lb 6.4 oz (75.5 kg)    Physical Exam Vitals reviewed.  Constitutional:      General: He is not in acute distress.    Appearance: Normal appearance. He is overweight. He is not ill-appearing, toxic-appearing or diaphoretic.  HENT:     Head: Normocephalic and atraumatic.  Eyes:     General: No scleral icterus.       Right eye: No discharge.        Left eye: No discharge.     Conjunctiva/sclera: Conjunctivae normal.  Cardiovascular:     Rate and Rhythm: Normal rate and regular rhythm.     Heart sounds: Normal heart sounds. No murmur heard.   No friction rub. No gallop.  Pulmonary:     Effort:  Pulmonary effort is normal. No respiratory distress.     Breath sounds: Normal breath sounds. No stridor. No wheezing, rhonchi or rales.  Musculoskeletal:        General: Normal range of motion.     Cervical back: Normal range of motion.  Skin:    General: Skin is warm and dry.  Neurological:     Mental Status: He is alert and oriented to person, place, and time. Mental status is at baseline.  Psychiatric:        Mood and Affect: Mood normal.        Behavior: Behavior normal.        Thought Content: Thought content normal.        Judgment: Judgment normal.    No results found for: TSH Lab Results  Component Value Date   WBC 15.8 (H) 06/21/2021   HGB 11.4 (L) 06/21/2021   HCT 34.2 (L) 06/21/2021   MCV 89.8 06/21/2021   PLT 194 06/21/2021   Lab Results  Component Value Date   NA 147 (H) 06/21/2021   K 4.2 06/21/2021   CO2 25 06/21/2021   GLUCOSE 125 (H) 06/21/2021   BUN 20 06/21/2021   CREATININE 0.92 06/21/2021   BILITOT 1.0 04/27/2021   ALKPHOS 75 04/27/2021   AST 19 04/27/2021   ALT 30 04/27/2021   PROT 7.8 04/27/2021   ALBUMIN 5.1 (H) 04/27/2021   CALCIUM 8.5 (L) 06/21/2021   ANIONGAP 14 06/21/2021   EGFR 84 04/27/2021   Lab Results  Component Value Date   CHOL 126 06/15/2021   Lab Results  Component Value Date   HDL 35 (L) 06/15/2021   Lab Results  Component Value Date   LDLCALC 73 06/15/2021   Lab Results  Component Value Date   TRIG 88 06/15/2021   Lab Results  Component Value Date   CHOLHDL 3.6 06/15/2021   Lab Results  Component Value Date   HGBA1C 8.4 (H) 06/19/2021

## 2021-07-26 LAB — CBC WITH DIFFERENTIAL/PLATELET
Basophils Absolute: 0.1 10*3/uL (ref 0.0–0.2)
Basos: 1 %
EOS (ABSOLUTE): 0.1 10*3/uL (ref 0.0–0.4)
Eos: 2 %
Hematocrit: 46.2 % (ref 37.5–51.0)
Hemoglobin: 15.6 g/dL (ref 13.0–17.7)
Immature Grans (Abs): 0 10*3/uL (ref 0.0–0.1)
Immature Granulocytes: 0 %
Lymphocytes Absolute: 1.9 10*3/uL (ref 0.7–3.1)
Lymphs: 25 %
MCH: 29.3 pg (ref 26.6–33.0)
MCHC: 33.8 g/dL (ref 31.5–35.7)
MCV: 87 fL (ref 79–97)
Monocytes Absolute: 0.7 10*3/uL (ref 0.1–0.9)
Monocytes: 9 %
Neutrophils Absolute: 4.8 10*3/uL (ref 1.4–7.0)
Neutrophils: 63 %
Platelets: 258 10*3/uL (ref 150–450)
RBC: 5.32 x10E6/uL (ref 4.14–5.80)
RDW: 13.2 % (ref 11.6–15.4)
WBC: 7.6 10*3/uL (ref 3.4–10.8)

## 2021-07-26 LAB — CMP14+EGFR
ALT: 23 IU/L (ref 0–44)
AST: 18 IU/L (ref 0–40)
Albumin/Globulin Ratio: 1.9 (ref 1.2–2.2)
Albumin: 4.9 g/dL (ref 3.8–4.9)
Alkaline Phosphatase: 74 IU/L (ref 44–121)
BUN/Creatinine Ratio: 13 (ref 9–20)
BUN: 12 mg/dL (ref 6–24)
Bilirubin Total: 0.7 mg/dL (ref 0.0–1.2)
CO2: 22 mmol/L (ref 20–29)
Calcium: 10.1 mg/dL (ref 8.7–10.2)
Chloride: 102 mmol/L (ref 96–106)
Creatinine, Ser: 0.92 mg/dL (ref 0.76–1.27)
Globulin, Total: 2.6 g/dL (ref 1.5–4.5)
Glucose: 141 mg/dL — ABNORMAL HIGH (ref 70–99)
Potassium: 4.4 mmol/L (ref 3.5–5.2)
Sodium: 143 mmol/L (ref 134–144)
Total Protein: 7.5 g/dL (ref 6.0–8.5)
eGFR: 99 mL/min/{1.73_m2} (ref >59)

## 2021-07-26 LAB — LIPID PANEL
Chol/HDL Ratio: 3.9 ratio (ref 0.0–5.0)
Cholesterol, Total: 132 mg/dL (ref 100–199)
HDL: 34 mg/dL — ABNORMAL LOW (ref >39)
LDL Chol Calc (NIH): 77 mg/dL (ref 0–99)
Triglycerides: 112 mg/dL (ref 0–149)
VLDL Cholesterol Cal: 21 mg/dL (ref 5–40)

## 2021-07-26 LAB — MICROALBUMIN / CREATININE URINE RATIO
Creatinine, Urine: 114.5 mg/dL
Microalb/Creat Ratio: 4 mg/g creat (ref 0–29)
Microalbumin, Urine: 4.8 ug/mL

## 2021-07-31 ENCOUNTER — Other Ambulatory Visit: Payer: Self-pay

## 2021-07-31 DIAGNOSIS — E782 Mixed hyperlipidemia: Secondary | ICD-10-CM

## 2021-07-31 MED ORDER — EZETIMIBE 10 MG PO TABS
10.0000 mg | ORAL_TABLET | Freq: Every day | ORAL | 3 refills | Status: DC
Start: 2021-07-31 — End: 2022-05-18

## 2021-08-01 ENCOUNTER — Other Ambulatory Visit: Payer: Self-pay | Admitting: Family Medicine

## 2021-08-01 DIAGNOSIS — G479 Sleep disorder, unspecified: Secondary | ICD-10-CM

## 2021-08-05 ENCOUNTER — Other Ambulatory Visit: Payer: Self-pay | Admitting: Family Medicine

## 2021-08-05 DIAGNOSIS — E1141 Type 2 diabetes mellitus with diabetic mononeuropathy: Secondary | ICD-10-CM

## 2021-08-08 LAB — COLOGUARD: COLOGUARD: NEGATIVE

## 2021-08-16 ENCOUNTER — Other Ambulatory Visit: Payer: Self-pay | Admitting: Physician Assistant

## 2021-08-17 ENCOUNTER — Other Ambulatory Visit: Payer: Self-pay | Admitting: Family Medicine

## 2021-08-17 DIAGNOSIS — E1141 Type 2 diabetes mellitus with diabetic mononeuropathy: Secondary | ICD-10-CM

## 2021-08-17 DIAGNOSIS — I10 Essential (primary) hypertension: Secondary | ICD-10-CM

## 2021-08-19 ENCOUNTER — Other Ambulatory Visit: Payer: Self-pay | Admitting: Physician Assistant

## 2021-08-24 ENCOUNTER — Other Ambulatory Visit: Payer: Self-pay | Admitting: Family Medicine

## 2021-08-24 ENCOUNTER — Other Ambulatory Visit: Payer: Self-pay | Admitting: Physician Assistant

## 2021-08-24 DIAGNOSIS — I10 Essential (primary) hypertension: Secondary | ICD-10-CM

## 2021-08-24 DIAGNOSIS — E1141 Type 2 diabetes mellitus with diabetic mononeuropathy: Secondary | ICD-10-CM

## 2021-08-24 DIAGNOSIS — E782 Mixed hyperlipidemia: Secondary | ICD-10-CM

## 2021-08-24 MED ORDER — AMLODIPINE BESYLATE 5 MG PO TABS: 5.0000 mg | ORAL_TABLET | Freq: Every day | ORAL | 1 refills | Status: DC

## 2021-08-24 NOTE — Telephone Encounter (Signed)
Pt aware refill sent to pharmacy 

## 2021-08-24 NOTE — Telephone Encounter (Signed)
°  Prescription Request  08/24/2021  Is this a "Controlled Substance" medicine? No   Have you seen your PCP in the last 2 weeks? 11-22  If YES, route message to pool  -  If NO, patient needs to be scheduled for appointment.  What is the name of the medication or equipmen amlodipine 5 mg  Have you contacted your pharmacy to request a refill? yes   Which pharmacy would you like this sent to? [facility]   Patient notified that their request is being sent to the clinical staff for review and that they should receive a response within 2 business days.

## 2021-08-28 ENCOUNTER — Other Ambulatory Visit: Payer: Self-pay | Admitting: Family Medicine

## 2021-08-28 DIAGNOSIS — E1141 Type 2 diabetes mellitus with diabetic mononeuropathy: Secondary | ICD-10-CM

## 2021-09-03 ENCOUNTER — Telehealth: Payer: Self-pay | Admitting: Student

## 2021-09-03 ENCOUNTER — Emergency Department (HOSPITAL_COMMUNITY): Payer: 59

## 2021-09-03 ENCOUNTER — Emergency Department (HOSPITAL_COMMUNITY)
Admission: EM | Admit: 2021-09-03 | Discharge: 2021-09-03 | Disposition: A | Discharge status: Home / Self Care | Payer: 59 | Attending: Emergency Medicine | Admitting: Emergency Medicine

## 2021-09-03 ENCOUNTER — Encounter (HOSPITAL_COMMUNITY): Payer: Self-pay

## 2021-09-03 ENCOUNTER — Other Ambulatory Visit: Payer: Self-pay

## 2021-09-03 DIAGNOSIS — Z951 Presence of aortocoronary bypass graft: Secondary | ICD-10-CM | POA: Insufficient documentation

## 2021-09-03 DIAGNOSIS — Z7984 Long term (current) use of oral hypoglycemic drugs: Secondary | ICD-10-CM | POA: Diagnosis not present

## 2021-09-03 DIAGNOSIS — R079 Chest pain, unspecified: Secondary | ICD-10-CM | POA: Insufficient documentation

## 2021-09-03 DIAGNOSIS — I2510 Atherosclerotic heart disease of native coronary artery without angina pectoris: Secondary | ICD-10-CM | POA: Diagnosis not present

## 2021-09-03 DIAGNOSIS — E119 Type 2 diabetes mellitus without complications: Secondary | ICD-10-CM | POA: Insufficient documentation

## 2021-09-03 DIAGNOSIS — M79602 Pain in left arm: Secondary | ICD-10-CM

## 2021-09-03 DIAGNOSIS — Z7982 Long term (current) use of aspirin: Secondary | ICD-10-CM | POA: Diagnosis not present

## 2021-09-03 DIAGNOSIS — Z79899 Other long term (current) drug therapy: Secondary | ICD-10-CM | POA: Diagnosis not present

## 2021-09-03 DIAGNOSIS — M79601 Pain in right arm: Secondary | ICD-10-CM

## 2021-09-03 DIAGNOSIS — I10 Essential (primary) hypertension: Secondary | ICD-10-CM | POA: Insufficient documentation

## 2021-09-03 DIAGNOSIS — R0789 Other chest pain: Secondary | ICD-10-CM

## 2021-09-03 LAB — CBC
HCT: 46.3 % (ref 39.0–52.0)
Hemoglobin: 15.8 g/dL (ref 13.0–17.0)
MCH: 29.9 pg (ref 26.0–34.0)
MCHC: 34.1 g/dL (ref 30.0–36.0)
MCV: 87.7 fL (ref 80.0–100.0)
Platelets: 228 10*3/uL (ref 150–400)
RBC: 5.28 MIL/uL (ref 4.22–5.81)
RDW: 12.9 % (ref 11.5–15.5)
WBC: 12.1 10*3/uL — ABNORMAL HIGH (ref 4.0–10.5)
nRBC: 0 % (ref 0.0–0.2)

## 2021-09-03 LAB — BASIC METABOLIC PANEL
Anion gap: 15 (ref 5–15)
BUN: 17 mg/dL (ref 6–20)
CO2: 21 mmol/L — ABNORMAL LOW (ref 22–32)
Calcium: 9.7 mg/dL (ref 8.9–10.3)
Chloride: 101 mmol/L (ref 98–111)
Creatinine, Ser: 0.89 mg/dL (ref 0.61–1.24)
GFR, Estimated: >60 mL/min (ref >60)
Glucose, Bld: 151 mg/dL — ABNORMAL HIGH (ref 70–99)
Potassium: 3.6 mmol/L (ref 3.5–5.1)
Sodium: 137 mmol/L (ref 135–145)

## 2021-09-03 LAB — TROPONIN I (HIGH SENSITIVITY)
Troponin I (High Sensitivity): 6 ng/L (ref <18)
Troponin I (High Sensitivity): 8 ng/L (ref <18)

## 2021-09-03 MED ORDER — HYDROMORPHONE HCL 1 MG/ML IJ SOLN
0.5000 mg | Freq: Once | INTRAMUSCULAR | Status: AC
  Administered 2021-09-03: 0.5 mg via INTRAVENOUS
  Filled 2021-09-03: qty 1

## 2021-09-03 MED ORDER — ONDANSETRON HCL 4 MG/2ML IJ SOLN
4.0000 mg | Freq: Once | INTRAMUSCULAR | Status: AC
  Administered 2021-09-03: 4 mg via INTRAVENOUS
  Filled 2021-09-03: qty 2

## 2021-09-03 MED ORDER — IOHEXOL 350 MG/ML SOLN
75.0000 mL | Freq: Once | INTRAVENOUS | Status: AC | PRN
  Administered 2021-09-03: 75 mL via INTRAVENOUS

## 2021-09-03 MED ORDER — SODIUM CHLORIDE 0.9 % IV BOLUS
1000.0000 mL | Freq: Once | INTRAVENOUS | Status: AC
  Administered 2021-09-03: 1000 mL via INTRAVENOUS

## 2021-09-03 MED ORDER — PREDNISONE 10 MG PO TABS: 30.0000 mg | ORAL_TABLET | Freq: Every day | ORAL | 0 refills | Status: DC

## 2021-09-03 NOTE — ED Provider Notes (Signed)
[facility] EMERGENCY DEPARTMENT Provider Note   CSN: [id number] Arrival date & time: 09/03/21  1337     History  Chief Complaint  Patient presents with   Chest Pain    Matthew Mann is a 56 y.o. male.  HPI  Patient with medical history including type 2 diabetes, hypertension, hyperlipidemia, family history of CAD presents to the emergent for chief complaint of bilateral arm pain and chest pain.  Patient states this has been going on since his surgery back in October but started on 26 December he started to have pain in his left arm, goes from his left elbow up into his left shoulder/shoulder blade, states the pain is intermittent but has become more consistent, he states now he has pain in his right arm as well, again goes from the elbow up into the shoulder, he states that pain pain is not affected by movement, he has occasional paresthesias in his fingers but currently has none at this time, he denies any headaches, change in vision, weakness in the upper or lower extremities.  States that he has has no shortness of breath, pleuritic chest pain, denies peripheral edema, orthopnea, he has no other complaints at this time.  He spoke with his on-call cardiologist and recommend that he comes in here for further evaluation.  After reviewing patient's charts patient being followed by Dr. Sherren Mocha of cardiac surgery had a 5 vessel CABG performed on 10/12 currently recovering well.  Last seen at his office on 11/21 without abnormalities identified.  Home Medications Prior to Admission medications   Medication Sig Start Date End Date Taking? Authorizing Provider  ezetimibe (ZETIA) 10 MG tablet Take 1 tablet (10 mg total) by mouth daily. 07/31/21 10/29/21  Deberah Pelton, NP  acetaminophen (TYLENOL) 500 MG tablet Take 2 tablets (1,000 mg total) by mouth every 6 (six) hours as needed. 06/23/21   Barrett, Erin R, PA-C  amLODipine (NORVASC) 5 MG tablet Take 1 tablet (5 mg total) by mouth daily.  08/24/21   Loman Brooklyn, FNP  aspirin 81 MG EC tablet Take 1 tablet by mouth daily.    [provider]  atorvastatin (LIPITOR) 40 MG tablet TAKE 1 TABLET BY MOUTH EVERY DAY 08/24/21   Loman Brooklyn, FNP  atorvastatin (LIPITOR) 80 MG tablet Take 1 tablet (80 mg total) by mouth daily. 06/23/21   Barrett, Lodema Hong, PA-C  clopidogrel (PLAVIX) 75 MG tablet Take 1 tablet (75 mg total) by mouth daily. 06/23/21   Barrett, Erin R, PA-C  FARXIGA 10 MG TABS tablet TAKE 1 TABLET BY MOUTH EVERY DAY 08/17/21   Loman Brooklyn, FNP  glimepiride (AMARYL) 2 MG tablet TAKE 1 TABLET (2 MG TOTAL) BY MOUTH IN THE MORNING AND AT BEDTIME. 08/07/21   Loman Brooklyn, FNP  glimepiride (AMARYL) 4 MG tablet Take 1 tablet (4 mg total) by mouth in the morning and at bedtime. Patient taking differently: Take 4 mg by mouth daily with breakfast. 02/21/21   Loman Brooklyn, FNP  losartan (COZAAR) 100 MG tablet TAKE 1 TABLET BY MOUTH EVERY DAY 08/24/21   Hendricks Limes F, FNP  metFORMIN (GLUCOPHAGE-XR) 500 MG 24 hr tablet TAKE 1 TABLET BY MOUTH TWICE A DAY 08/24/21   Loman Brooklyn, FNP  metoprolol tartrate (LOPRESSOR) 25 MG tablet Take 1 tablet (25 mg total) by mouth 2 (two) times daily. 06/23/21   Barrett, Erin R, PA-C  OZEMPIC, 0.25 OR 0.5 MG/DOSE, 2 MG/1.5ML SOPN INJECT 0.25MG  INTO THE  SKIN ONE TIME PER WEEK 08/29/21   Hendricks Limes F, FNP  traZODone (DESYREL) 50 MG tablet TAKE 1-2 TABLETS BY MOUTH AT BEDTIME AS NEEDED FOR SLEEP. 08/01/21   Loman Brooklyn, FNP      Allergies    Ace inhibitors    Review of Systems   Review of Systems  Constitutional:  Negative for chills and fever.  HENT:  Negative for congestion.   Respiratory:  Negative for shortness of breath.   Cardiovascular:  Positive for chest pain.  Gastrointestinal:  Negative for abdominal pain.  Genitourinary:  Negative for enuresis.  Musculoskeletal:  Negative for back pain.       Bilateral arm pain  Skin:  Negative for rash.   Neurological:  Negative for dizziness.       Intermittent paresthesias in his upper extremities.  Hematological:  Does not bruise/bleed easily.   Physical Exam Updated Vital Signs BP (!) 171/94    Pulse (!) 105    Temp 98.2 F (36.8 C) (Oral)    Resp (!) 21    Ht 5\' 7"  (1.702 m)    Wt 77.1 kg    SpO2 99%    BMI 26.63 kg/m  Physical Exam Vitals and nursing note reviewed.  Constitutional:      General: He is not in acute distress.    Appearance: He is not ill-appearing.  HENT:     Head: Normocephalic and atraumatic.     Nose: No congestion.  Eyes:     Conjunctiva/sclera: Conjunctivae normal.  Cardiovascular:     Rate and Rhythm: Normal rate and regular rhythm.     Pulses: Normal pulses.     Heart sounds: No murmur heard.   No friction rub. No gallop.  Pulmonary:     Effort: No respiratory distress.     Breath sounds: No wheezing, rhonchi or rales.     Comments: Chest is nontender to palpation, he has notable surgical scars on his sternum, healing well no signs infection present. Abdominal:     Palpations: Abdomen is soft.     Tenderness: There is no right CVA tenderness or left CVA tenderness.  Musculoskeletal:     Right lower leg: No edema.     Left lower leg: No edema.     Comments: Upper extremities were visualized she has a noted surgical scar on his left forearm from previous CABG, healing well, no sign infection present.  He has full range of motion his fingers wrist elbow and shoulder, neurovascularly intact, 5 5 strength.  Good capillary refill bilaterally.  Skin:    General: Skin is warm and dry.  Neurological:     Mental Status: He is alert.     Comments: No facial asymmetry, no difficult word finding, no slurring of his words, no uvula weakness present my exam.  Psychiatric:        Mood and Affect: Mood normal.    ED Results / Procedures / Treatments   Labs (all labs ordered are listed, but only abnormal results are displayed) Labs Reviewed  BASIC METABOLIC  PANEL - Abnormal; Notable for the following components:      Result Value   CO2 21 (*)    Glucose, Bld 151 (*)    All other components within normal limits  CBC - Abnormal; Notable for the following components:   WBC 12.1 (*)    All other components within normal limits  TROPONIN I (HIGH SENSITIVITY)  TROPONIN I (HIGH SENSITIVITY)    EKG  None  Radiology DG Chest 2 View  Result Date: 09/03/2021 CLINICAL DATA:  Chest pain.  Bypass surgery in October 2022. EXAM: CHEST - 2 VIEW COMPARISON:  07/24/2021. FINDINGS: Stable changes from prior CABG surgery. Cardiac silhouette is normal in size. Normal mediastinal and hilar contours. Clear lungs.  No pleural effusion or pneumothorax. Skeletal structures are intact. IMPRESSION: No active cardiopulmonary disease. Electronically Signed   By: Lajean Manes M.D.   On: 09/03/2021 14:25   CT Angio Chest PE W and/or Wo Contrast  Result Date: 09/03/2021 CLINICAL DATA:  Chest pain since Wednesday. EXAM: CT ANGIOGRAPHY CHEST WITH CONTRAST TECHNIQUE: Multidetector CT imaging of the chest was performed using the standard protocol during bolus administration of intravenous contrast. Multiplanar CT image reconstructions and MIPs were obtained to evaluate the vascular anatomy. CONTRAST:  22mL OMNIPAQUE IOHEXOL 350 MG/ML SOLN COMPARISON:  06/14/2021 FINDINGS: Cardiovascular: The heart is normal in size. No pericardial effusion. The aorta is normal in caliber. No dissection. No atherosclerotic calcifications. There are surgical changes from coronary artery bypass surgery. The pulmonary arterial tree is fairly well opacified. No filling defects to suggest pulmonary embolism. Mediastinum/Nodes: Stable calcified left AP window nodes. No mediastinal or hilar mass or adenopathy. The esophagus is unremarkable. Lungs/Pleura: Mild peribronchial thickening and increased interstitial markings predominantly in the lower lung zones suggesting bronchitis or possibly interstitial  pneumonitis. No focal airspace consolidation to suggest typical pneumonia. No pleural effusions. No pulmonary lesions. Upper Abdomen: No significant upper abdominal findings. Stable splenic calcified granulomas. 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 There is a lucency in the T11 vertebral body on the right side unchanged since prior study from 06/14/2021. This could be a benign hemangioma but is indeterminate. I do not see any other definite bone lesions. Recommend MRI thoracic spine for further evaluation/better characterization. Review of the MIP images confirms the above findings. IMPRESSION: 1. No CT findings for pulmonary embolism. 2. Normal thoracic aorta. 3. Surgical changes from coronary artery bypass surgery. 4. Mild peribronchial thickening and increased interstitial markings predominantly in the lower lung zones suggesting bronchitis or possibly interstitial pneumonitis. No focal airspace consolidation to suggest typical pneumonia. 5. Stable calcified left AP window nodes and splenic granulomas. 6. Stable T11 vertebral body lucency. Recommend MRI thoracic spine for further evaluation. Electronically Signed   By: Marijo Sanes M.D.   On: 09/03/2021 16:06    Procedures Procedures    Medications Ordered in ED Medications  sodium chloride 0.9 % bolus 1,000 mL (has no administration in time range)  HYDROmorphone (DILAUDID) injection 0.5 mg (0.5 mg Intravenous Given 09/03/21 1555)  ondansetron (ZOFRAN) injection 4 mg (4 mg Intravenous Given 09/03/21 1554)  iohexol (OMNIPAQUE) 350 MG/ML injection 75 mL (75 mLs Intravenous Contrast Given 09/03/21 1534)    ED Course/ Medical Decision Making/ A&P                           Medical Decision Making  This patient presents to the ED for concern of bilateral arm pain chest pain, this involves an extensive number of treatment options, and is a complaint that carries with it a high risk of complications and morbidity.  The differential diagnosis  includes dissection, ACS, spinal cord abnormality    Additional history obtained:  Additional history obtained from electronic medical record External records from outside source obtained and reviewed including previous cardiothoracic notes   Co morbidities that complicate the patient evaluation  Diabetes, hypertension  Social Determinants of Health:  N/A    Lab Tests:  I Ordered, and personally interpreted labs.  The pertinent results include: CBC shows slight leukocytosis of 12.1, BMP shows CO2 of 21, glucose 151 for troponin is 6, second troponin is 8   Imaging Studies ordered:  I ordered imaging studies including chest x-ray, CT of chest I independently visualized and interpreted imaging which showed chest x-ray unremarkable, CT of chest negative for PE, normal thoracic aorta, there is some thickening around the bronchioles suggestive of bronchitis and/or interstitial pneumonitis.  Stable T11 vertebral lucency. I agree with the radiologist interpretation   Cardiac Monitoring:  The patient was maintained on a cardiac monitor.  I personally viewed and interpreted the cardiac monitored which showed an underlying rhythm of: EKG sinus tach without signs of ischemia.   Medicines ordered and prescription drug management:  I ordered medication including Dilaudid for pain Reevaluation of the patient after these medicines showed that the patient improved I have reviewed the patients home medicines and have made adjustments as needed   Reevaluation:  After the interventions noted above, I reevaluated the patient and found that they have :improved  Consultations Obtained:  I requested consultation with the [facility],  and discussed lab and imaging findings as well as pertinent plan - they recommend: Recommend outpatient MRI of C-spine and thoracic spine and start him on a steroid Dosepak.    Test Considered:  Considered MRI of spine but there is signs of emergent  abnormalities will defer at this time and have him obtain this as an outpatient.    Rule out I have low suspicion for ACS as history is atypical, patient has no cardiac history, EKG was sinus rhythm without signs of ischemia, patient had a delta troponin.  Low suspicion for PE and/or dissection as CT imaging is negative for these findings.  I have low suspicion for emergent spinal cord abnormality as there is no weakness noted in the upper or lower extremities, no focal deficits noted.  Low suspicion for systemic infection as patient is nontoxic-appearing, vital signs reassuring, no obvious source infection noted on exam.     Dispostion:  After consideration of the diagnostic results and the patients response to treatment, I feel that the patent would benefit the steroid Dosepak.  I suspect patient's pain is coming from his spine, so as to help with his pain.  We will have him follow-up with his primary care provider for's MRI of the cervical and thoracic spine.  We will also follow-up with thoracic surgery for further evaluation.  Problem List / ED Course:  T12 translucency Bilateral arm pain Chest pain          Final Clinical Impression(s) / ED Diagnoses Final diagnoses:  None    Rx / DC Orders ED Discharge Orders     None         Marcello Fennel, PA-C 09/03/21 Toribio Harbour, MD 09/07/21 [phone]

## 2021-09-03 NOTE — Telephone Encounter (Signed)
° °  Patient called Answering Service with concerns for bilateral arm pain. Called and spoke with patient. He had a CABG back in 06/2021 and has been recovering well since then; however, this past Wednesday he started to have left arm pain that he describes as a pain and numbness somewhat similar to the symptoms he had prior to CABG. He denies any real chest pain but he does have some upper back pain. He also reports feeling very fatigued which is new. Recommended patient go to the closest ED. Advised patient not to drive himself. Patient voiced understanding and thanked me for calling.  Corrin Parker, PA-C 09/03/2021 12:33 PM

## 2021-09-03 NOTE — Discharge Instructions (Addendum)
Your CT scan shows that you have a translucency seen on your T11 plastic causing your pain in your arms.  I have started you on steroids please take as prescribed.  I like you to follow-up with your primary care doctor to schedule a outpatient MRI of your C-spine as well as your thoracic spine.  When she have these results would like to follow-up with neurosurgery for further evaluation.  Please follow-up with your cardiologist in a week's time for reassessment of your chest pain.  Come back to the emergency department if you develop chest pain, shortness of breath, severe abdominal pain, uncontrolled nausea, vomiting, diarrhea.

## 2021-09-03 NOTE — ED Triage Notes (Signed)
Complaining of chest pain that started this past Wednesday with radiation to left arm, shoulder and neck. Cardiac sx this past October.

## 2021-09-03 NOTE — ED Provider Notes (Signed)
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  He notes that he did possibly have some weakness earlier in the week as he was dropping something with his left hand but he does not have that weakness at this time.  He denies any difficulty with his legs that when he did wake up this morning he noticed that his left leg was numb from the knee down on the medial surface of the leg.  That is completely gone at this time.  On my exam the patient has totally normal strength of his bilateral upper extremities including abduction and abduction of the shoulders, internal and external rotation of the shoulders, flexion and extension at the biceps and triceps as well as grips bilaterally.  His radial median and ulnar nerves are completely intact but he does have some decreased sensation to his middle 3 fingers bilaterally.  He has normal finger-nose-finger, no pronator drift and his bilateral lower extremities are totally normal in strength and sensation.  Cranial nerves III through XII are normal.  Cardiac and pulmonary exams are unremarkable except for mild sinus tachycardia with a rate of about 105 bpm.  Very thorough work-up was initiated showing a normal troponin, CT scan angiogram of the chest was used to rule out dissection or pulmonary embolism however it did show that there was a T11 vertebral body lucency, this is possibly related to what is going on as this seems to be spinal though not the right level for his symptoms.  Will discuss with neurosurgery.   EKG  Interpretation  Date/Time:  Sunday September 03 2021 13:49:23 EST Ventricular Rate:  107 PR Interval:  111 QRS Duration: 88 QT Interval:  338 QTC Calculation: 451 R Axis:   19 Text Interpretation: Sinus tachycardia Probable left atrial enlargement Nonspecific T abnrm, anterolateral leads Baseline wander in lead(s) V2 Confirmed by Eber Hong ([id number]) on 09/03/2021 4:34:55 PM          Eber Hong, MD 09/07/21 1444

## 2021-09-07 ENCOUNTER — Encounter (HOSPITAL_COMMUNITY): Payer: Self-pay

## 2021-09-07 ENCOUNTER — Encounter (HOSPITAL_COMMUNITY)
Admission: RE | Admit: 2021-09-07 | Discharge: 2021-09-07 | Disposition: A | Discharge status: Home / Self Care | Payer: 59 | Source: Ambulatory Visit | Attending: Cardiovascular Disease | Admitting: Cardiovascular Disease

## 2021-09-07 VITALS — BP 120/80 | HR 91 | Ht 67.0 in | Wt 162.5 lb

## 2021-09-07 DIAGNOSIS — I214 Non-ST elevation (NSTEMI) myocardial infarction: Secondary | ICD-10-CM | POA: Insufficient documentation

## 2021-09-07 DIAGNOSIS — Z951 Presence of aortocoronary bypass graft: Secondary | ICD-10-CM | POA: Diagnosis present

## 2021-09-07 NOTE — Progress Notes (Signed)
Cardiac Individual Treatment Plan  Patient Details  Name: Matthew Mann MRN: [id number] Date of Birth: [birth year] Referring Provider:   Flowsheet Row CARDIAC REHAB PHASE II ORIENTATION from 09/07/2021 in [facility]  Referring Provider Dr. Kipp Brood       Initial Encounter Date:  Flowsheet Row CARDIAC REHAB PHASE II ORIENTATION from 09/07/2021 in [facility]  Date 09/07/21       Visit Diagnosis: NSTEMI (non-ST elevated myocardial infarction) ([place])  S/P CABG x 5  Patient's Home Medications on Admission:  Current Outpatient Medications:    acetaminophen (TYLENOL) 500 MG tablet, Take 2 tablets (1,000 mg total) by mouth every 6 (six) hours as needed., Disp: 30 tablet, Rfl: 0   amLODipine (NORVASC) 5 MG tablet, Take 1 tablet (5 mg total) by mouth daily., Disp: 90 tablet, Rfl: 1   aspirin 81 MG EC tablet, Take 1 tablet by mouth daily., Disp: , Rfl:    atorvastatin (LIPITOR) 80 MG tablet, Take 1 tablet (80 mg total) by mouth daily., Disp: 30 tablet, Rfl: 3   clopidogrel (PLAVIX) 75 MG tablet, Take 1 tablet (75 mg total) by mouth daily., Disp: 30 tablet, Rfl: 3   ezetimibe (ZETIA) 10 MG tablet, Take 1 tablet (10 mg total) by mouth daily., Disp: 90 tablet, Rfl: 3   FARXIGA 10 MG TABS tablet, TAKE 1 TABLET BY MOUTH EVERY DAY, Disp: 90 tablet, Rfl: 0   losartan (COZAAR) 100 MG tablet, TAKE 1 TABLET BY MOUTH EVERY DAY, Disp: 90 tablet, Rfl: 0   metFORMIN (GLUCOPHAGE-XR) 500 MG 24 hr tablet, TAKE 1 TABLET BY MOUTH TWICE A DAY (Patient taking differently: Take 1,000 mg by mouth daily with breakfast.), Disp: 180 tablet, Rfl: 0   metoprolol tartrate (LOPRESSOR) 25 MG tablet, Take 1 tablet (25 mg total) by mouth 2 (two) times daily., Disp: 60 tablet, Rfl: 3   Multiple Vitamins-Minerals (MULTIVITAMIN WITH MINERALS) tablet, Take 2 tablets by mouth daily., Disp: , Rfl:    ondansetron (ZOFRAN-ODT) 8 MG disintegrating tablet, Take 8 mg by mouth every 8 (eight) hours  as needed for nausea or vomiting., Disp: , Rfl:    OZEMPIC, 0.25 OR 0.5 MG/DOSE, 2 MG/1.5ML SOPN, INJECT 0.25MG  INTO THE SKIN ONE TIME PER WEEK (Patient taking differently: Inject 0.25 mg into the skin every Monday.), Disp: 1.5 mL, Rfl: 0   predniSONE (DELTASONE) 10 MG tablet, Take 3 tablets (30 mg total) by mouth daily for 5 days., Disp: 15 tablet, Rfl: 0   traZODone (DESYREL) 50 MG tablet, TAKE 1-2 TABLETS BY MOUTH AT BEDTIME AS NEEDED FOR SLEEP., Disp: 90 tablet, Rfl: 1  Past Medical History: Past Medical History:  Diagnosis Date   Diabetes mellitus without complication ([place])    Hyperlipidemia    Hypertension     Tobacco Use: Social History   Tobacco Use  Smoking Status Never  Smokeless Tobacco Never    Labs: Recent Review Flowsheet Data     Labs for ITP Cardiac and Pulmonary Rehab Latest Ref Rng & Units 06/19/2021 06/19/2021 06/19/2021 06/19/2021 07/25/2021   Cholestrol 100 - 199 mg/dL - - - - 132   LDLCALC 0 - 99 mg/dL - - - - 77   HDL >39 mg/dL - - - - 34(L)   Trlycerides 0 - 149 mg/dL - - - - 112   Hemoglobin A1c 4.8 - 5.6 % - - - - 7.0(H)   PHART 7.350 - 7.450 - 7.366 7.388 7.310(L) -   PCO2ART 32.0 - 48.0 mmHg -  39.4 32.0 30.1(L) -   HCO3 20.0 - 28.0 mmol/L - 22.7 19.4(L) 15.1(L) -   TCO2 22 - 32 mmol/L 22 24 20(L) 16(L) -   ACIDBASEDEF 0.0 - 2.0 mmol/L - 3.0(H) 5.0(H) 10.0(H) -   O2SAT % - 90.0 95.0 95.0 -       Capillary Blood Glucose: Lab Results  Component Value Date   GLUCAP 158 (H) 06/23/2021   GLUCAP 91 06/23/2021   GLUCAP 191 (H) 06/22/2021   GLUCAP 237 (H) 06/22/2021   GLUCAP 145 (H) 06/22/2021     Exercise Target Goals: Exercise Program Goal: Individual exercise prescription set using results from initial 6 min walk test and THRR while considering  patients activity barriers and safety.   Exercise Prescription Goal: Starting with aerobic activity 30 plus minutes a day, 3 days per week for initial exercise prescription. Provide home exercise  prescription and guidelines that participant acknowledges understanding prior to discharge.  Activity Barriers & Risk Stratification:  Activity Barriers & Cardiac Risk Stratification - 09/07/21 1249       Activity Barriers & Cardiac Risk Stratification   Activity Barriers None    Cardiac Risk Stratification High             6 Minute Walk:  6 Minute Walk     Row Name 09/07/21 1404         6 Minute Walk   Phase Initial     Distance 1300 feet     Walk Time 6 minutes     # of Rest Breaks 0     MPH 2.46     METS 3.93     RPE 11     VO2 Peak 13.77     Symptoms No     Resting HR 91 bpm     Resting BP 120/80     Resting Oxygen Saturation  98 %     Exercise Oxygen Saturation  during 6 min walk 99 %     Max Ex. HR 95 bpm     Max Ex. BP 136/72     2 Minute Post BP 118/78              Oxygen Initial Assessment:   Oxygen Re-Evaluation:   Oxygen Discharge (Final Oxygen Re-Evaluation):   Initial Exercise Prescription:  Initial Exercise Prescription - 09/07/21 1400       Date of Initial Exercise RX and Referring Provider   Date 09/07/21    Referring Provider Dr. Kipp Brood    Expected Discharge Date 12/01/21      Treadmill   MPH 2.4    Grade 0    Minutes 22      Recumbant Elliptical   Level 1    RPM 50    Minutes 17      Prescription Details   Frequency (times per week) 3    Duration Progress to 30 minutes of continuous aerobic without signs/symptoms of physical distress      Intensity   THRR 40-80% of Max Heartrate 67-134    Ratings of Perceived Exertion 11-13    Perceived Dyspnea 0-4      Resistance Training   Training Prescription Yes    Weight 4 lbs    Reps 10-15             Perform Capillary Blood Glucose checks as needed.  Exercise Prescription Changes:   Exercise Comments:   Exercise Goals and Review:   Exercise Goals     Row Name 09/07/21  1408             Exercise Goals   Increase Physical Activity Yes        Intervention Provide advice, education, support and counseling about physical activity/exercise needs.;Develop an individualized exercise prescription for aerobic and resistive training based on initial evaluation findings, risk stratification, comorbidities and participant's personal goals.       Expected Outcomes Short Term: Attend rehab on a regular basis to increase amount of physical activity.;Long Term: Add in home exercise to make exercise part of routine and to increase amount of physical activity.;Long Term: Exercising regularly at least 3-5 days a week.       Increase Strength and Stamina Yes       Intervention Provide advice, education, support and counseling about physical activity/exercise needs.;Develop an individualized exercise prescription for aerobic and resistive training based on initial evaluation findings, risk stratification, comorbidities and participant's personal goals.       Expected Outcomes Short Term: Increase workloads from initial exercise prescription for resistance, speed, and METs.;Short Term: Perform resistance training exercises routinely during rehab and add in resistance training at home;Long Term: Improve cardiorespiratory fitness, muscular endurance and strength as measured by increased METs and functional capacity (6MWT)       Able to understand and use rate of perceived exertion (RPE) scale Yes       Intervention Provide education and explanation on how to use RPE scale       Expected Outcomes Short Term: Able to use RPE daily in rehab to express subjective intensity level;Long Term:  Able to use RPE to guide intensity level when exercising independently       Knowledge and understanding of Target Heart Rate Range (THRR) Yes       Intervention Provide education and explanation of THRR including how the numbers were predicted and where they are located for reference       Expected Outcomes Short Term: Able to state/look up THRR;Long Term: Able to use THRR to govern  intensity when exercising independently;Short Term: Able to use daily as guideline for intensity in rehab       Able to check pulse independently Yes       Intervention Provide education and demonstration on how to check pulse in carotid and radial arteries.;Review the importance of being able to check your own pulse for safety during independent exercise       Expected Outcomes Short Term: Able to explain why pulse checking is important during independent exercise;Long Term: Able to check pulse independently and accurately       Understanding of Exercise Prescription Yes       Intervention Provide education, explanation, and written materials on patient's individual exercise prescription       Expected Outcomes Short Term: Able to explain program exercise prescription;Long Term: Able to explain home exercise prescription to exercise independently                Exercise Goals Re-Evaluation :    Discharge Exercise Prescription (Final Exercise Prescription Changes):   Nutrition:  Target Goals: Understanding of nutrition guidelines, daily intake of sodium 1500mg , cholesterol 200mg , calories 30% from fat and 7% or less from saturated fats, daily to have 5 or more servings of fruits and vegetables.  Biometrics:  Pre Biometrics - 09/07/21 1408       Pre Biometrics   Height 5\' 7"  (1.702 m)    Weight 73.7 kg    Waist Circumference 35 inches  Hip Circumference 37 inches    Waist to Hip Ratio 0.95 %    BMI (Calculated) 25.44    Triceps Skinfold 9 mm    % Body Fat 21.9 %    Grip Strength 47.7 kg    Flexibility 7.75 in    Single Leg Stand 10.16 seconds              Nutrition Therapy Plan and Nutrition Goals:  Nutrition Therapy & Goals - 09/07/21 1332       Personal Nutrition Goals   Additional Goals? No    Comments Patient scored 25 on his diet assessment. Handout provided and explained regarding healthier choices and DM informataion. Patient verbalized understanding.  We offer 2 educational sessions on heart healthy nutrition with handouts and assistance with RD referral if patient is interested. He says he and his wife.are following a dash heart healthy diet.      Intervention Plan   Intervention Nutrition handout(s) given to patient.    Expected Outcomes Short Term Goal: Understand basic principles of dietary content, such as calories, fat, sodium, cholesterol and nutrients.             Nutrition Assessments:  Nutrition Assessments - 09/07/21 1332       MEDFICTS Scores   Pre Score 25            MEDIFICTS Score Key: ?70 Need to make dietary changes  40-70 Heart Healthy Diet ? 40 Therapeutic Level Cholesterol Diet   Picture Your Plate Scores: D34-534 Unhealthy dietary pattern with much room for improvement. 41-50 Dietary pattern unlikely to meet recommendations for good health and room for improvement. 51-60 More healthful dietary pattern, with some room for improvement.  >60 Healthy dietary pattern, although there may be some specific behaviors that could be improved.    Nutrition Goals Re-Evaluation:   Nutrition Goals Discharge (Final Nutrition Goals Re-Evaluation):   Psychosocial: Target Goals: Acknowledge presence or absence of significant depression and/or stress, maximize coping skills, provide positive support system. Participant is able to verbalize types and ability to use techniques and skills needed for reducing stress and depression.  Initial Review & Psychosocial Screening:  Initial Psych Review & Screening - 09/07/21 1413       Initial Review   Current issues with None Identified      Family Dynamics   Good Support System? Yes      Barriers   Psychosocial barriers to participate in program There are no identifiable barriers or psychosocial needs.      Screening Interventions   Interventions Encouraged to exercise    Expected Outcomes Short Term goal: Identification and review with participant of any Quality of  Life or Depression concerns found by scoring the questionnaire.             Quality of Life Scores:  Quality of Life - 09/07/21 1404       Quality of Life   Select Quality of Life      Quality of Life Scores   Health/Function Pre 25.87 %    Socioeconomic Pre 27.5 %    Psych/Spiritual Pre 28.57 %    Family Pre 28.3 %    GLOBAL Pre 27.12 %            Scores of 19 and below usually indicate a poorer quality of life in these areas.  A difference of  2-3 points is a clinically meaningful difference.  A difference of 2-3 points in the total score of the  Quality of Life Index has been associated with significant improvement in overall quality of life, self-image, physical symptoms, and general health in studies assessing change in quality of life.  PHQ-9: Recent Review Flowsheet Data     Depression screen [facility] 2/9 09/07/2021 07/25/2021 04/27/2021 02/21/2021 12/23/2020   Decreased Interest 0 0 0 0 0   Down, Depressed, Hopeless 0 - 0 0 0   PHQ - 2 Score 0 0 0 0 0   Altered sleeping 0 - 0 0 -   Tired, decreased energy 1 - 0 0 -   Change in appetite 0 - 0 0 -   Feeling bad or failure about yourself  0 - 0 0 -   Trouble concentrating 0 - 0 0 -   Moving slowly or fidgety/restless 0 - 0 0 -   Suicidal thoughts 0 - 0 0 -   PHQ-9 Score 1 - 0 0 -   Difficult doing work/chores Not difficult at all - Not difficult at all Not difficult at all -      Interpretation of Total Score  Total Score Depression Severity:  1-4 = Minimal depression, 5-9 = Mild depression, 10-14 = Moderate depression, 15-19 = Moderately severe depression, 20-27 = Severe depression   Psychosocial Evaluation and Intervention:  Psychosocial Evaluation - 09/07/21 1414       Psychosocial Evaluation & Interventions   Interventions Stress management education;Relaxation education;Encouraged to exercise with the program and follow exercise prescription    Comments Patient has no psychosocial barriers or needs identified  at his orientation visit. His PHQ-9 score was 1. He denies any depression, anxiety, or stress concerns. 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 He says he only took 1 or 2 times. He is currently sleeping well without medication. 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 He lives with his wife of many years. They have 4 children and 3 grandchildren. All of his children live out of town but he says he is very close with all of them. He names his wife as his main support and says he also has several pastor friends that are very supportative. He is looking forward to doing the program hoping to get back to his normal activities and gain his strength back.    Expected Outcomes Patient will continue to have no psychosocial barriers or issues identified.    Continue Psychosocial Services  No Follow up required             Psychosocial Re-Evaluation:   Psychosocial Discharge (Final Psychosocial Re-Evaluation):   Vocational Rehabilitation: Provide vocational rehab assistance to qualifying candidates.   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             Education: Education Goals: Education classes will be provided on a weekly basis, covering required topics. Participant will state understanding/return demonstration of topics presented.  Learning Barriers/Preferences:  Learning Barriers/Preferences - 09/07/21 1410       Learning Barriers/Preferences   Learning Barriers None    Learning Preferences Audio;Written Material;Skilled Demonstration  Education  Topics: Hypertension, Hypertension Reduction -Define heart disease and high blood pressure. Discus how high blood pressure affects the body and ways to reduce high blood pressure.   Exercise and Your Heart -Discuss why it is important to exercise, the FITT principles of exercise, normal and abnormal responses to exercise, and how to exercise safely.   Angina -Discuss definition of angina, causes of angina, treatment of angina, and how to decrease risk of having angina.   Cardiac Medications -Review what the following cardiac medications are used for, how they affect the body, and side effects that may occur when taking the medications.  Medications include Aspirin, Beta blockers, calcium channel blockers, ACE Inhibitors, angiotensin receptor blockers, diuretics, digoxin, and antihyperlipidemics.   Congestive Heart Failure -Discuss the definition of CHF, how to live with CHF, the signs and symptoms of CHF, and how keep track of weight and sodium intake.   Heart Disease and Intimacy -Discus the effect sexual activity has on the heart, how changes occur during intimacy as we age, and safety during sexual activity.   Smoking Cessation / COPD -Discuss different methods to quit smoking, the health benefits of quitting smoking, and the definition of COPD.   Nutrition I: Fats -Discuss the types of cholesterol, what cholesterol does to the heart, and how cholesterol levels can be controlled.   Nutrition II: Labels -Discuss the different components of food labels and how to read food label   Heart Parts/Heart Disease and PAD -Discuss the anatomy of the heart, the pathway of blood circulation through the heart, and these are affected by heart disease.   Stress I: Signs and Symptoms -Discuss the causes of stress, how stress may lead to anxiety and depression, and ways to limit stress.   Stress II: Relaxation -Discuss different types of relaxation techniques to limit  stress.   Warning Signs of Stroke / TIA -Discuss definition of a stroke, what the signs and symptoms are of a stroke, and how to identify when someone is having stroke.   Knowledge Questionnaire Score:  Knowledge Questionnaire Score - 09/07/21 1336       Knowledge Questionnaire Score   Pre Score 20/24             Core Components/Risk Factors/Patient Goals at Admission:  Personal Goals and Risk Factors at Admission - 09/07/21 1410       Core Components/Risk Factors/Patient Goals on Admission    Weight Management Weight Maintenance    Diabetes Yes    Intervention Provide education about signs/symptoms and action to take for hypo/hyperglycemia.;Provide education about proper nutrition, including hydration, and aerobic/resistive exercise prescription along with prescribed medications to achieve blood glucose in normal ranges: Fasting glucose 65-99 mg/dL    Expected Outcomes Short Term: Participant verbalizes understanding of the signs/symptoms and immediate care of hyper/hypoglycemia, proper foot care and importance of medication, aerobic/resistive exercise and nutrition plan for blood glucose control.;Long Term: Attainment of HbA1C < 7%.    Lipids Yes    Intervention Provide education and support for participant on nutrition & aerobic/resistive exercise along with prescribed medications to achieve LDL 70mg , HDL >40mg .    Expected Outcomes Short Term: Participant states understanding of desired cholesterol values and is compliant with medications prescribed. Participant is following exercise prescription and nutrition guidelines.;Long Term: Cholesterol controlled with medications as prescribed, with individualized exercise RX and with personalized nutrition plan. Value goals: LDL < 70mg , HDL > 40 mg.    Personal Goal Other Yes    Personal Goal Patient wants  to get stronger; recovy from his surgery and get back to his ADL's and working full-time as Theme park manager.    Intervention Patient will  attend the CR program with exercise and education and supplement with exercise at home.    Expected Outcomes Patient will complete the program meeting both personal and program goals.             Core Components/Risk Factors/Patient Goals Review:    Core Components/Risk Factors/Patient Goals at Discharge (Final Review):    ITP Comments:   Comments: Patient arrived for 1st visit/orientation/education at 1230. Patient was referred to CR by Dr. Kipp Brood due to NSTEMI (I21.4) and S/P CABGx5 (Z95.1). During orientation advised patient on arrival and appointment times what to wear, what to do before, during and after exercise. Reviewed attendance and class policy.  Pt is scheduled to return Cardiac Rehab on 09/11/21 at 9:30. Pt was advised to come to class 15 minutes before class starts.  Discussed RPE/Dpysnea scales. Patient participated in warm up stretches. Patient was able to complete 6 minute walk test.  Telemetry:NSR Patient was measured for the equipment. Discussed equipment safety with patient. Took patient pre-anthropometric measurements. Patient finished visit at 1400.

## 2021-09-08 ENCOUNTER — Encounter: Payer: Self-pay | Admitting: Family Medicine

## 2021-09-08 ENCOUNTER — Ambulatory Visit (INDEPENDENT_AMBULATORY_CARE_PROVIDER_SITE_OTHER): Payer: 59 | Admitting: Family Medicine

## 2021-09-08 VITALS — BP 135/91 | HR 77 | Temp 96.8°F | Ht 67.0 in | Wt 165.4 lb

## 2021-09-08 DIAGNOSIS — E1141 Type 2 diabetes mellitus with diabetic mononeuropathy: Secondary | ICD-10-CM

## 2021-09-08 DIAGNOSIS — I10 Essential (primary) hypertension: Secondary | ICD-10-CM | POA: Diagnosis not present

## 2021-09-08 DIAGNOSIS — E782 Mixed hyperlipidemia: Secondary | ICD-10-CM | POA: Diagnosis not present

## 2021-09-08 DIAGNOSIS — I252 Old myocardial infarction: Secondary | ICD-10-CM | POA: Diagnosis not present

## 2021-09-08 DIAGNOSIS — G479 Sleep disorder, unspecified: Secondary | ICD-10-CM

## 2021-09-08 DIAGNOSIS — M5412 Radiculopathy, cervical region: Secondary | ICD-10-CM

## 2021-09-08 MED ORDER — METHOCARBAMOL 500 MG PO TABS: 500.0000 mg | ORAL_TABLET | Freq: Three times a day (TID) | ORAL | 1 refills | Status: DC | PRN

## 2021-09-08 MED ORDER — ATORVASTATIN CALCIUM 80 MG PO TABS: 80.0000 mg | ORAL_TABLET | Freq: Every day | ORAL | 1 refills | Status: DC

## 2021-09-08 MED ORDER — OZEMPIC (0.25 OR 0.5 MG/DOSE) 2 MG/1.5ML ~~LOC~~ SOPN: 0.5000 mg | PEN_INJECTOR | SUBCUTANEOUS | 2 refills | Status: DC

## 2021-09-08 NOTE — Progress Notes (Signed)
Assessment & Plan:  1. Type 2 diabetes mellitus with diabetic mononeuropathy, without long-term current use of insulin (HCC) Lab Results  Component Value Date   HGBA1C 7.0 (H) 07/25/2021   HGBA1C 8.4 (H) 06/19/2021   HGBA1C 8.9 (H) 04/27/2021    - Diabetes is not at goal of A1c < 7, but has been improving. - Medications: continue current medications with an increase in Ozempic from 0.25 mg to 0.5 mg weekly. - Home glucose monitoring: continue monitoring - Patient is currently taking a statin. Patient is taking an ACE-inhibitor/ARB.  - Instruction/counseling given: reminded to get eye exam which is scheduled for next month.  Diabetes Health Maintenance Due  Topic Date Due   OPHTHALMOLOGY EXAM  Never done   HEMOGLOBIN A1C  01/22/2022   FOOT EXAM  02/21/2022    Lab Results  Component Value Date   LABMICR 4.8 07/25/2021   - Semaglutide,0.25 or 0.5MG/DOS, (OZEMPIC, 0.25 OR 0.5 MG/DOSE,) 2 MG/1.5ML SOPN; Inject 0.5 mg into the skin every Monday.  Dispense: 2 mL; Refill: 2 - atorvastatin (LIPITOR) 80 MG tablet; Take 1 tablet (80 mg total) by mouth daily.  Dispense: 90 tablet; Refill: 1  2. Essential hypertension Well controlled on current regimen per his readings at home.  3. History of non-ST elevation myocardial infarction (NSTEMI) Continue current medications. Managed by cardiology.  4. Mixed hyperlipidemia Continue current medications. - atorvastatin (LIPITOR) 80 MG tablet; Take 1 tablet (80 mg total) by mouth daily.  Dispense: 90 tablet; Refill: 1  5. Difficulty sleeping Improving.  6. Left cervical radiculopathy Education provided on cervical radiculopathy. Patient to complete exercises at home and continue Ibuprofen as needed. Discussed if no improvement in symptoms after 6 weeks a MRI can be ordered. - methocarbamol (ROBAXIN) 500 MG tablet; Take 1 tablet (500 mg total) by mouth every 8 (eight) hours as needed for muscle spasms.  Dispense: 60 tablet; Refill:  1   Return in about 2 months (around 11/06/2021) for annual physical.  Hendricks Limes, MSN, APRN, FNP-C [facility]  Subjective:    Patient ID: Matthew Mann, male    DOB: [birth year], 56 y.o.   MRN: [id number]  Patient Care Team: Loman Brooklyn, FNP as PCP - General (Family Medicine) O'Neal, Cassie Freer, MD as PCP - Cardiology (Cardiology) [facility] Deberah Pelton, NP as Nurse Practitioner (Cardiology)   Chief Complaint:  Chief Complaint  Patient presents with   Diabetes   Referral    MRI- pinched nerve. ER visit 1/1    HPI: Matthew Mann is a 56 y.o. male presenting on 09/08/2021 for Diabetes and Referral (MRI- pinched nerve. ER visit 1/1)  Diabetes with hypertension and hyperlipidemia: Patient presents for follow up of diabetes. Current symptoms include: hyperglycemia. Known diabetic complications: cardiovascular disease. Medication compliance: taking Farxiga 10 mg QD, metformin 1,000 mg daily, and Ozempic 0.25 mg weekly. The Ozempic was just added at our last visit 6 weeks ago at which time glimepiride was discontinued. Current diet: in general, a "healthy" diet  . Current exercise: starting cardiac rehab next week which will be three times per week x12 weeks . Home blood sugar records: BGs range between 130 and 200 . Is he  on ACE inhibitor or angiotensin II receptor blocker? Yes (Losartan). Is he on a statin? Yes (Atorvastatin).   Hypertension: patient does check his blood pressure at home and reports readings 120-130/70-85. States he is always higher at the doctor's office.   Difficulty sleeping: patient was started  on Trazodone at our last visit and reports he has taken it a couple of times and it makes him loopy the next day. He does feel like his sleep pattern is getting back to normal. Most recently he has not slept as well since he has been taking prednisone.  New complaints: Patient his requesting a MRI today at the recommendation of the ER  due to a pinched nerve with pain radiating from his shoulder down to his left three fingers. The pain and radiation was initially in both arms but has subsided in the right. He has been taking a steroid taper and Ibuprofen at home.    Social history:  Relevant past medical, surgical, family and social history reviewed and updated as indicated. Interim medical history since our last visit reviewed.  Allergies and medications reviewed and updated.  DATA REVIEWED: CHART IN EPIC  ROS: Negative unless specifically indicated above in HPI.    Current Outpatient Medications:    acetaminophen (TYLENOL) 500 MG tablet, Take 2 tablets (1,000 mg total) by mouth every 6 (six) hours as needed., Disp: 30 tablet, Rfl: 0   amLODipine (NORVASC) 5 MG tablet, Take 1 tablet (5 mg total) by mouth daily., Disp: 90 tablet, Rfl: 1   aspirin 81 MG EC tablet, Take 1 tablet by mouth daily., Disp: , Rfl:    atorvastatin (LIPITOR) 80 MG tablet, Take 1 tablet (80 mg total) by mouth daily., Disp: 30 tablet, Rfl: 3   clopidogrel (PLAVIX) 75 MG tablet, Take 1 tablet (75 mg total) by mouth daily., Disp: 30 tablet, Rfl: 3   ezetimibe (ZETIA) 10 MG tablet, Take 1 tablet (10 mg total) by mouth daily., Disp: 90 tablet, Rfl: 3   FARXIGA 10 MG TABS tablet, TAKE 1 TABLET BY MOUTH EVERY DAY, Disp: 90 tablet, Rfl: 0   losartan (COZAAR) 100 MG tablet, TAKE 1 TABLET BY MOUTH EVERY DAY, Disp: 90 tablet, Rfl: 0   metFORMIN (GLUCOPHAGE-XR) 500 MG 24 hr tablet, TAKE 1 TABLET BY MOUTH TWICE A DAY (Patient taking differently: Take 1,000 mg by mouth daily with breakfast.), Disp: 180 tablet, Rfl: 0   metoprolol tartrate (LOPRESSOR) 25 MG tablet, Take 1 tablet (25 mg total) by mouth 2 (two) times daily., Disp: 60 tablet, Rfl: 3   Multiple Vitamins-Minerals (MULTIVITAMIN WITH MINERALS) tablet, Take 2 tablets by mouth daily., Disp: , Rfl:    OZEMPIC, 0.25 OR 0.5 MG/DOSE, 2 MG/1.5ML SOPN, INJECT 0.25MG INTO THE SKIN ONE TIME PER WEEK (Patient  taking differently: Inject 0.25 mg into the skin every Monday.), Disp: 1.5 mL, Rfl: 0   traZODone (DESYREL) 50 MG tablet, TAKE 1-2 TABLETS BY MOUTH AT BEDTIME AS NEEDED FOR SLEEP., Disp: 90 tablet, Rfl: 1   Allergies  Allergen Reactions   Ace Inhibitors Swelling and Other (See Comments)    Tongue swelling and cough.    Past Medical History:  Diagnosis Date   Diabetes mellitus without complication ([place])    Hyperlipidemia    Hypertension     Past Surgical History:  Procedure Laterality Date   CORONARY ARTERY BYPASS GRAFT N/A 06/19/2021   Procedure: CORONARY ARTERY BYPASS GRAFTING TIMES 5;  Surgeon: Lajuana Matte, MD;  Location: [place];  Service: Open Heart Surgery;  Laterality: N/A;   ENDOVEIN HARVEST OF GREATER SAPHENOUS VEIN Right 06/19/2021   Procedure: ENDOVEIN HARVEST OF GREATER SAPHENOUS VEIN;  Surgeon: Lajuana Matte, MD;  Location: [place];  Service: Open Heart Surgery;  Laterality: Right;   LEFT HEART CATH AND  CORONARY ANGIOGRAPHY N/A 06/14/2021   Procedure: LEFT HEART CATH AND CORONARY ANGIOGRAPHY;  Surgeon: Sherren Mocha, MD;  Location: [place] [facility];  Service: Cardiovascular;  Laterality: N/A;   RADIAL ARTERY HARVEST Left 06/19/2021   Procedure: RADIAL ARTERY HARVEST;  Surgeon: Lajuana Matte, MD;  Location: [place];  Service: Open Heart Surgery;  Laterality: Left;   TEE WITHOUT CARDIOVERSION N/A 06/19/2021   Procedure: TRANSESOPHAGEAL ECHOCARDIOGRAM (TEE);  Surgeon: Lajuana Matte, MD;  Location: [place];  Service: Open Heart Surgery;  Laterality: N/A;    Social History   Socioeconomic History   Marital status: Married    Spouse name: Not on file   Number of children: Not on file   Years of education: Not on file   Highest education level: Not on file  Occupational History   Not on file  Tobacco Use   Smoking status: Never   Smokeless tobacco: Never  Vaping Use   Vaping Use: Never used  Substance and Sexual Activity   Alcohol use: Never    Drug use: Never   Sexual activity: Yes  Other Topics Concern   Not on file  Social History Narrative   Not on file   Social Determinants of Health   Financial Resource Strain: Not on file  Food Insecurity: Not on file  Transportation Needs: Not on file  Physical Activity: Not on file  Stress: Not on file  Social Connections: Not on file  Intimate Partner Violence: Not on file        Objective:    BP (!) 135/91    Pulse 77    Temp (!) 96.8 F (36 C) (Temporal)    Ht '5\' 7"'  (1.702 m)    Wt 165 lb 6.4 oz (75 kg)    SpO2 98%    BMI 25.91 kg/m   Wt Readings from Last 3 Encounters:  09/08/21 165 lb 6.4 oz (75 kg)  09/07/21 162 lb 7.7 oz (73.7 kg)  09/03/21 170 lb (77.1 kg)    Physical Exam Vitals reviewed.  Constitutional:      General: He is not in acute distress.    Appearance: Normal appearance. He is overweight. He is not ill-appearing, toxic-appearing or diaphoretic.  HENT:     Head: Normocephalic and atraumatic.  Eyes:     General: No scleral icterus.       Right eye: No discharge.        Left eye: No discharge.     Conjunctiva/sclera: Conjunctivae normal.  Cardiovascular:     Rate and Rhythm: Normal rate and regular rhythm.     Heart sounds: Normal heart sounds. No murmur heard.   No friction rub. No gallop.  Pulmonary:     Effort: Pulmonary effort is normal. No respiratory distress.     Breath sounds: Normal breath sounds. No stridor. No wheezing, rhonchi or rales.  Musculoskeletal:        General: Normal range of motion.     Left shoulder: Tenderness (muscular) present.     Cervical back: Normal range of motion.  Skin:    General: Skin is warm and dry.  Neurological:     Mental Status: He is alert and oriented to person, place, and time. Mental status is at baseline.  Psychiatric:        Mood and Affect: Mood normal.        Behavior: Behavior normal.        Thought Content: Thought content normal.  Judgment: Judgment normal.    No results  found for: TSH Lab Results  Component Value Date   WBC 12.1 (H) 09/03/2021   HGB 15.8 09/03/2021   HCT 46.3 09/03/2021   MCV 87.7 09/03/2021   PLT 228 09/03/2021   Lab Results  Component Value Date   NA 137 09/03/2021   K 3.6 09/03/2021   CO2 21 (L) 09/03/2021   GLUCOSE 151 (H) 09/03/2021   BUN 17 09/03/2021   CREATININE 0.89 09/03/2021   BILITOT 0.7 07/25/2021   ALKPHOS 74 07/25/2021   AST 18 07/25/2021   ALT 23 07/25/2021   PROT 7.5 07/25/2021   ALBUMIN 4.9 07/25/2021   CALCIUM 9.7 09/03/2021   ANIONGAP 15 09/03/2021   EGFR 99 07/25/2021   Lab Results  Component Value Date   CHOL 132 07/25/2021   Lab Results  Component Value Date   HDL 34 (L) 07/25/2021   Lab Results  Component Value Date   LDLCALC 77 07/25/2021   Lab Results  Component Value Date   TRIG 112 07/25/2021   Lab Results  Component Value Date   CHOLHDL 3.9 07/25/2021   Lab Results  Component Value Date   HGBA1C 7.0 (H) 07/25/2021

## 2021-09-10 ENCOUNTER — Other Ambulatory Visit: Payer: Self-pay | Admitting: Family Medicine

## 2021-09-10 DIAGNOSIS — G479 Sleep disorder, unspecified: Secondary | ICD-10-CM

## 2021-09-11 ENCOUNTER — Encounter (HOSPITAL_COMMUNITY)
Admission: RE | Admit: 2021-09-11 | Discharge: 2021-09-11 | Disposition: A | Discharge status: Home / Self Care | Payer: 59 | Source: Ambulatory Visit | Attending: Cardiovascular Disease | Admitting: Cardiovascular Disease

## 2021-09-11 DIAGNOSIS — Z951 Presence of aortocoronary bypass graft: Secondary | ICD-10-CM

## 2021-09-11 DIAGNOSIS — I214 Non-ST elevation (NSTEMI) myocardial infarction: Secondary | ICD-10-CM | POA: Diagnosis not present

## 2021-09-11 NOTE — Progress Notes (Signed)
Daily Session Note  Patient Details  Name: Matthew Mann MRN: [id number] Date of Birth: 03/19/1966 Referring Provider:   Flowsheet Row CARDIAC REHAB PHASE II ORIENTATION from 09/07/2021 in [place]  Referring Provider Dr. Kipp Brood       Encounter Date: 09/11/2021  Check In:  Session Check In - 09/11/21 0927       Check-In   Supervising physician immediately available to respond to emergencies [facility] MD immediately available    Physician(s) Harrington Challenger    Location [facility]    Staff Present Hoy Register, MS, ACSM-CEP, Exercise Physiologist;Heather Otho Ket, BS, Exercise Physiologist;Rodriguez Aguinaldo Wynetta Emery, RN, BSN    Virtual Visit No    Medication changes reported     No    Fall or balance concerns reported    No    Tobacco Cessation No Change    Warm-up and Cool-down Performed as group-led instruction    Resistance Training Performed Yes    VAD Patient? No    PAD/SET Patient? No      Pain Assessment   Currently in Pain? No/denies    Pain Score 0-No pain    Multiple Pain Sites No             Capillary Blood Glucose: No results found for this or any previous visit (from the past 24 hour(s)).    Social History   Tobacco Use  Smoking Status Never  Smokeless Tobacco Never    Goals Met:  Independence with exercise equipment Exercise tolerated well No report of concerns or symptoms today Strength training completed today  Goals Unmet:  Not Applicable  Comments: Check out 1030.   Dr. Kathie Dike is Medical Director for [facility].

## 2021-09-13 ENCOUNTER — Encounter (HOSPITAL_COMMUNITY)
Admission: RE | Admit: 2021-09-13 | Discharge: 2021-09-13 | Disposition: A | Discharge status: Home / Self Care | Payer: 59 | Source: Ambulatory Visit | Attending: Cardiovascular Disease | Admitting: Cardiovascular Disease

## 2021-09-13 DIAGNOSIS — I214 Non-ST elevation (NSTEMI) myocardial infarction: Secondary | ICD-10-CM

## 2021-09-13 DIAGNOSIS — Z951 Presence of aortocoronary bypass graft: Secondary | ICD-10-CM

## 2021-09-13 NOTE — Progress Notes (Signed)
Daily Session Note  Patient Details  Name: Matthew Mann MRN: [id number] Date of Birth: [birth year] Referring Provider:   Flowsheet Row CARDIAC REHAB PHASE II ORIENTATION from 09/07/2021 in [place]  Referring Provider Dr. Kipp Brood       Encounter Date: 09/13/2021  Check In:  Session Check In - 09/13/21 0928       Check-In   Supervising physician immediately available to respond to emergencies [facility] MD immediately available    Physician(s) Dr. Gardiner Rhyme    Location [facility]    Staff Present Hoy Register, MS, ACSM-CEP, Exercise Physiologist;Heather Otho Ket, BS, Exercise Physiologist;Debra Wynetta Emery, RN, BSN    Virtual Visit No    Medication changes reported     No    Fall or balance concerns reported    No    Tobacco Cessation No Change    Warm-up and Cool-down Performed as group-led instruction    Resistance Training Performed Yes    VAD Patient? No    PAD/SET Patient? No      Pain Assessment   Currently in Pain? No/denies    Pain Score 0-No pain    Multiple Pain Sites No             Capillary Blood Glucose: No results found for this or any previous visit (from the past 24 hour(s)).    Social History   Tobacco Use  Smoking Status Never  Smokeless Tobacco Never    Goals Met:  Independence with exercise equipment Exercise tolerated well No report of concerns or symptoms today Strength training completed today  Goals Unmet:  Not Applicable  Comments: checkout time is 21   Dr. Kathie Dike is Medical Director for [facility] Re[facility].

## 2021-09-15 ENCOUNTER — Encounter (HOSPITAL_COMMUNITY)
Admission: RE | Admit: 2021-09-15 | Discharge: 2021-09-15 | Disposition: A | Discharge status: Home / Self Care | Payer: 59 | Source: Ambulatory Visit | Attending: Cardiovascular Disease | Admitting: Cardiovascular Disease

## 2021-09-15 DIAGNOSIS — Z951 Presence of aortocoronary bypass graft: Secondary | ICD-10-CM

## 2021-09-15 DIAGNOSIS — I214 Non-ST elevation (NSTEMI) myocardial infarction: Secondary | ICD-10-CM

## 2021-09-15 NOTE — Progress Notes (Signed)
Daily Session Note  Patient Details  Name: Matthew Mann MRN: [id number] Date of Birth: 07/14/1966 Referring Provider:   Flowsheet Row CARDIAC REHAB PHASE II ORIENTATION from 09/07/2021 in [place]  Referring Provider Dr. Kipp Brood       Encounter Date: 09/15/2021  Check In:  Session Check In - 09/15/21 0930       Check-In   Supervising physician immediately available to respond to emergencies [facility] MD immediately available    Physician(s) Dr. Domenic Polite    Location [facility]    Staff Present Hoy Register, MS, ACSM-CEP, Exercise Physiologist;Heather Otho Ket, BS, Exercise Physiologist;Debra Wynetta Emery, RN, BSN    Virtual Visit No    Medication changes reported     No    Fall or balance concerns reported    No    Tobacco Cessation No Change    Warm-up and Cool-down Performed as group-led instruction    Resistance Training Performed Yes    VAD Patient? No    PAD/SET Patient? No      Pain Assessment   Currently in Pain? No/denies    Pain Score 0-No pain    Multiple Pain Sites No             Capillary Blood Glucose: No results found for this or any previous visit (from the past 24 hour(s)).    Social History   Tobacco Use  Smoking Status Never  Smokeless Tobacco Never    Goals Met:  Independence with exercise equipment Exercise tolerated well No report of concerns or symptoms today Strength training completed today  Goals Unmet:  Not Applicable  Comments: checkout time is 61   Dr. Kathie Dike is Medical Director for [facility].

## 2021-09-18 ENCOUNTER — Other Ambulatory Visit: Payer: Self-pay

## 2021-09-18 ENCOUNTER — Encounter (HOSPITAL_COMMUNITY)
Admission: RE | Admit: 2021-09-18 | Discharge: 2021-09-18 | Disposition: A | Discharge status: Home / Self Care | Payer: 59 | Source: Ambulatory Visit | Attending: Cardiovascular Disease | Admitting: Cardiovascular Disease

## 2021-09-18 VITALS — Wt 162.3 lb

## 2021-09-18 DIAGNOSIS — I214 Non-ST elevation (NSTEMI) myocardial infarction: Secondary | ICD-10-CM

## 2021-09-18 DIAGNOSIS — Z951 Presence of aortocoronary bypass graft: Secondary | ICD-10-CM

## 2021-09-18 NOTE — Progress Notes (Signed)
Daily Session Note  Patient Details  Name: Breckin Savannah MRN: [id number] Date of Birth: [birth year] Referring Provider:   Flowsheet Row CARDIAC REHAB PHASE II ORIENTATION from 09/07/2021 in [place]  Referring Provider Dr. Kipp Brood       Encounter Date: 09/18/2021  Check In:  Session Check In - 09/18/21 0930       Check-In   Supervising physician immediately available to respond to emergencies [facility] MD immediately available    Physician(s) Dr. Harl Bowie    Location [facility]    Staff Present Hoy Register, MS, ACSM-CEP, Exercise Physiologist;Heather Otho Ket, BS, Exercise Physiologist;Debra Wynetta Emery, RN, BSN    Virtual Visit No    Medication changes reported     No    Fall or balance concerns reported    No    Tobacco Cessation No Change    Warm-up and Cool-down Performed as group-led instruction    VAD Patient? No    PAD/SET Patient? No      Pain Assessment   Currently in Pain? No/denies    Pain Score 0-No pain    Multiple Pain Sites No             Capillary Blood Glucose: No results found for this or any previous visit (from the past 24 hour(s)).    Social History   Tobacco Use  Smoking Status Never  Smokeless Tobacco Never    Goals Met:  Independence with exercise equipment Exercise tolerated well No report of concerns or symptoms today Strength training completed today  Goals Unmet:  Not Applicable  Comments: checkout time is 79   Dr. Kathie Dike is Medical Director for [facility].

## 2021-09-20 ENCOUNTER — Encounter (HOSPITAL_COMMUNITY): Payer: 59

## 2021-09-22 ENCOUNTER — Encounter (HOSPITAL_COMMUNITY)
Admission: RE | Admit: 2021-09-22 | Discharge: 2021-09-22 | Disposition: A | Discharge status: Home / Self Care | Payer: 59 | Source: Ambulatory Visit | Attending: Cardiovascular Disease | Admitting: Cardiovascular Disease

## 2021-09-22 DIAGNOSIS — Z951 Presence of aortocoronary bypass graft: Secondary | ICD-10-CM

## 2021-09-22 DIAGNOSIS — I214 Non-ST elevation (NSTEMI) myocardial infarction: Secondary | ICD-10-CM | POA: Diagnosis not present

## 2021-09-22 NOTE — Progress Notes (Signed)
Daily Session Note  Patient Details  Name: Matthew Mann MRN: [id number] Date of Birth: [birth year] Referring Provider:   Flowsheet Row CARDIAC REHAB PHASE II ORIENTATION from 09/07/2021 in [place]  Referring Provider Dr. Kipp Brood       Encounter Date: 09/22/2021  Check In:  Session Check In - 09/22/21 0930       Check-In   Supervising physician immediately available to respond to emergencies [facility] MD immediately available    Physician(s) Dr. Audie Box    Location [facility]    Staff Present Geanie Cooley, RN;Dalton Kris Mouton, MS, ACSM-CEP, Exercise Physiologist;Heather Zigmund Daniel, Exercise Physiologist    Virtual Visit No    Medication changes reported     No    Fall or balance concerns reported    No    Tobacco Cessation No Change    Warm-up and Cool-down Performed as group-led instruction    Resistance Training Performed Yes    VAD Patient? No    PAD/SET Patient? No      Pain Assessment   Currently in Pain? No/denies    Pain Score 0-No pain    Multiple Pain Sites No             Capillary Blood Glucose: No results found for this or any previous visit (from the past 24 hour(s)).    Social History   Tobacco Use  Smoking Status Never  Smokeless Tobacco Never    Goals Met:  Independence with exercise equipment Exercise tolerated well No report of concerns or symptoms today Strength training completed today  Goals Unmet:  Not Applicable  Comments: check out @ 10:30am   Dr. Kathie Dike is Medical Director for [facility] Pul[facility] Re[facility].

## 2021-09-25 ENCOUNTER — Encounter (HOSPITAL_COMMUNITY)
Admission: RE | Admit: 2021-09-25 | Discharge: 2021-09-25 | Disposition: A | Discharge status: Home / Self Care | Payer: 59 | Source: Ambulatory Visit | Attending: Cardiovascular Disease | Admitting: Cardiovascular Disease

## 2021-09-25 DIAGNOSIS — I214 Non-ST elevation (NSTEMI) myocardial infarction: Secondary | ICD-10-CM | POA: Diagnosis not present

## 2021-09-25 DIAGNOSIS — Z951 Presence of aortocoronary bypass graft: Secondary | ICD-10-CM

## 2021-09-25 NOTE — Progress Notes (Signed)
Daily Session Note  Patient Details  Name: Matthew Mann MRN: [id number] Date of Birth: [birth year] Referring Provider:   Flowsheet Row CARDIAC REHAB PHASE II ORIENTATION from 09/07/2021 in [place]  Referring Provider Dr. Kipp Brood       Encounter Date: 09/25/2021  Check In:  Session Check In - 09/25/21 0930       Check-In   Supervising physician immediately available to respond to emergencies [facility] MD immediately available    Physician(s) [place]    Location [facility]    Staff Present Hoy Register, MS, ACSM-CEP, Exercise Physiologist;Jayd Cadieux Wynetta Emery, RN, BSN;Other    Virtual Visit No    Medication changes reported     No    Fall or balance concerns reported    No    Tobacco Cessation No Change    Warm-up and Cool-down Performed as group-led instruction    Resistance Training Performed Yes    VAD Patient? No    PAD/SET Patient? No      Pain Assessment   Currently in Pain? No/denies    Pain Score 0-No pain    Multiple Pain Sites No             Capillary Blood Glucose: No results found for this or any previous visit (from the past 24 hour(s)).    Social History   Tobacco Use  Smoking Status Never  Smokeless Tobacco Never    Goals Met:  Independence with exercise equipment Exercise tolerated well No report of concerns or symptoms today Strength training completed today  Goals Unmet:  Not Applicable  Comments: Check out 1030.   Dr. Kathie Dike is Medical Director for [facility] Pulmonary Re[facility].

## 2021-09-27 ENCOUNTER — Encounter (HOSPITAL_COMMUNITY)
Admission: RE | Admit: 2021-09-27 | Discharge: 2021-09-27 | Disposition: A | Discharge status: Home / Self Care | Payer: 59 | Source: Ambulatory Visit | Attending: Cardiovascular Disease | Admitting: Cardiovascular Disease

## 2021-09-27 DIAGNOSIS — I214 Non-ST elevation (NSTEMI) myocardial infarction: Secondary | ICD-10-CM

## 2021-09-27 DIAGNOSIS — Z951 Presence of aortocoronary bypass graft: Secondary | ICD-10-CM

## 2021-09-27 NOTE — Progress Notes (Signed)
Daily Session Note  Patient Details  Name: Matthew Mann MRN: [id number] Date of Birth: August 08, 1966 Referring Provider:   Flowsheet Row CARDIAC REHAB PHASE II ORIENTATION from 09/07/2021 in [place]  Referring Provider Dr. Kipp Brood       Encounter Date: 09/27/2021  Check In:  Session Check In - 09/27/21 0930       Check-In   Supervising physician immediately available to respond to emergencies [facility] MD immediately available    Physician(s) Dr. Domenic Polite    Location [facility]    Staff Present Hoy Register, MS, ACSM-CEP, Exercise Physiologist;Debra Wynetta Emery, RN, BSN;Other;Eather Chaires, RN    Virtual Visit No    Medication changes reported     No    Fall or balance concerns reported    No    Tobacco Cessation No Change    Warm-up and Cool-down Performed as group-led instruction    Resistance Training Performed Yes    VAD Patient? No    PAD/SET Patient? No      Pain Assessment   Currently in Pain? No/denies    Pain Score 0-No pain    Multiple Pain Sites No             Capillary Blood Glucose: No results found for this or any previous visit (from the past 24 hour(s)).    Social History   Tobacco Use  Smoking Status Never  Smokeless Tobacco Never    Goals Met:  Independence with exercise equipment Exercise tolerated well No report of concerns or symptoms today Strength training completed today  Goals Unmet:  Not Applicable  Comments: check out @ 10:30am   Dr. Kathie Dike is Medical Director for [facility] Pulmonary Re[facility].

## 2021-09-29 ENCOUNTER — Encounter (HOSPITAL_COMMUNITY)
Admission: RE | Admit: 2021-09-29 | Discharge: 2021-09-29 | Disposition: A | Discharge status: Home / Self Care | Payer: 59 | Source: Ambulatory Visit | Attending: Cardiovascular Disease | Admitting: Cardiovascular Disease

## 2021-09-29 DIAGNOSIS — Z951 Presence of aortocoronary bypass graft: Secondary | ICD-10-CM

## 2021-09-29 DIAGNOSIS — I214 Non-ST elevation (NSTEMI) myocardial infarction: Secondary | ICD-10-CM

## 2021-09-29 NOTE — Progress Notes (Signed)
I have reviewed a Home Exercise Prescription with Matthew Mann . Matthew Mann is not currently exercising at home.  The patient was advised to walk and bike 5 days a week for 30-45 minutes.  Matthew Mann and I discussed how to progress their exercise prescription.  The patient stated that their goals were build back his energy and strength to get back to dailey activities.  The patient stated that they understand the exercise prescription.  We reviewed exercise guidelines, target heart rate during exercise, RPE Scale, weather conditions, NTG use, endpoints for exercise, warmup and cool down.  Patient is encouraged to come to me with any questions. I will continue to follow up with the patient to assist them with progression and safety.

## 2021-09-29 NOTE — Progress Notes (Signed)
Daily Session Note  Patient Details  Name: Qasim Diveley MRN: [id number] Date of Birth: 04/04/1966 Referring Provider:   Flowsheet Row CARDIAC REHAB PHASE II ORIENTATION from 09/07/2021 in [place]  Referring Provider Dr. Kipp Brood       Encounter Date: 09/29/2021  Check In:  Session Check In - 09/29/21 0915       Check-In   Supervising physician immediately available to respond to emergencies [facility] MD immediately available    Physician(s) Dr. Domenic Polite    Location [facility]    Staff Present Redge Gainer, BS, Exercise Physiologist;Merril Nagy Kris Mouton, MS, ACSM-CEP, Exercise Physiologist;Other    Virtual Visit No    Medication changes reported     No    Fall or balance concerns reported    No    Tobacco Cessation No Change    Warm-up and Cool-down Performed as group-led instruction    Resistance Training Performed Yes    VAD Patient? No    PAD/SET Patient? No      Pain Assessment   Currently in Pain? No/denies    Pain Score 0-No pain    Multiple Pain Sites No             Capillary Blood Glucose: No results found for this or any previous visit (from the past 24 hour(s)).    Social History   Tobacco Use  Smoking Status Never  Smokeless Tobacco Never    Goals Met:  Independence with exercise equipment Exercise tolerated well No report of concerns or symptoms today Strength training completed today  Goals Unmet:  Not Applicable  Comments: checkout time is 42   Dr. Kathie Dike is Medical Director for [facility] Pulmonary Re[facility].

## 2021-10-02 ENCOUNTER — Encounter (HOSPITAL_COMMUNITY)
Admission: RE | Admit: 2021-10-02 | Discharge: 2021-10-02 | Disposition: A | Discharge status: Home / Self Care | Payer: 59 | Source: Ambulatory Visit | Attending: Cardiovascular Disease | Admitting: Cardiovascular Disease

## 2021-10-02 VITALS — Wt 162.7 lb

## 2021-10-02 DIAGNOSIS — Z951 Presence of aortocoronary bypass graft: Secondary | ICD-10-CM

## 2021-10-02 DIAGNOSIS — I214 Non-ST elevation (NSTEMI) myocardial infarction: Secondary | ICD-10-CM | POA: Diagnosis not present

## 2021-10-02 NOTE — Progress Notes (Signed)
Daily Session Note  Patient Details  Name: Chanel Mckesson MRN: [id number] Date of Birth: 09/26/1965 Referring Provider:   Flowsheet Row CARDIAC REHAB PHASE II ORIENTATION from 09/07/2021 in [facility]  Referring Provider Dr. Kipp Brood       Encounter Date: 10/02/2021  Check In:  Session Check In - 10/02/21 0930       Check-In   Supervising physician immediately available to respond to emergencies [facility] MD immediately available    Physician(s) Johney Frame    Location [facility]    Staff Present Aundra Dubin, RN, Bjorn Loser, MS, ACSM-CEP, Exercise Physiologist    Virtual Visit No    Medication changes reported     No    Fall or balance concerns reported    No    Tobacco Cessation No Change    Warm-up and Cool-down Performed as group-led instruction    Resistance Training Performed Yes    VAD Patient? No    PAD/SET Patient? No      Pain Assessment   Currently in Pain? No/denies    Pain Score 0-No pain    Multiple Pain Sites No             Capillary Blood Glucose: No results found for this or any previous visit (from the past 24 hour(s)).    Social History   Tobacco Use  Smoking Status Never  Smokeless Tobacco Never    Goals Met:  Independence with exercise equipment Exercise tolerated well No report of concerns or symptoms today Strength training completed today  Goals Unmet:  Not Applicable  Comments: Check out 1030.   Dr. Kathie Dike is Medical Director for [facility].

## 2021-10-04 ENCOUNTER — Encounter (HOSPITAL_COMMUNITY)
Admission: RE | Admit: 2021-10-04 | Discharge: 2021-10-04 | Disposition: A | Discharge status: Home / Self Care | Payer: 59 | Source: Ambulatory Visit | Attending: Cardiovascular Disease | Admitting: Cardiovascular Disease

## 2021-10-04 DIAGNOSIS — Z951 Presence of aortocoronary bypass graft: Secondary | ICD-10-CM | POA: Insufficient documentation

## 2021-10-04 DIAGNOSIS — I214 Non-ST elevation (NSTEMI) myocardial infarction: Secondary | ICD-10-CM | POA: Insufficient documentation

## 2021-10-04 NOTE — Progress Notes (Signed)
Cardiac Individual Treatment Plan  Patient Details  Name: Matthew Mann MRN: [id number] Date of Birth: 1965/11/08 Referring Provider:   Flowsheet Row CARDIAC REHAB PHASE II ORIENTATION from 09/07/2021 in [facility]  Referring Provider Dr. Kipp Brood       Initial Encounter Date:  Flowsheet Row CARDIAC REHAB PHASE II ORIENTATION from 09/07/2021 in [facility]  Date 09/07/21       Visit Diagnosis: NSTEMI (non-ST elevated myocardial infarction) ([place])  S/P CABG x 5  Patient's Home Medications on Admission:  Current Outpatient Medications:    acetaminophen (TYLENOL) 500 MG tablet, Take 2 tablets (1,000 mg total) by mouth every 6 (six) hours as needed., Disp: 30 tablet, Rfl: 0   amLODipine (NORVASC) 5 MG tablet, Take 1 tablet (5 mg total) by mouth daily., Disp: 90 tablet, Rfl: 1   aspirin 81 MG EC tablet, Take 1 tablet by mouth daily., Disp: , Rfl:    atorvastatin (LIPITOR) 80 MG tablet, Take 1 tablet (80 mg total) by mouth daily., Disp: 90 tablet, Rfl: 1   clopidogrel (PLAVIX) 75 MG tablet, Take 1 tablet (75 mg total) by mouth daily., Disp: 30 tablet, Rfl: 3   ezetimibe (ZETIA) 10 MG tablet, Take 1 tablet (10 mg total) by mouth daily., Disp: 90 tablet, Rfl: 3   FARXIGA 10 MG TABS tablet, TAKE 1 TABLET BY MOUTH EVERY DAY, Disp: 90 tablet, Rfl: 0   losartan (COZAAR) 100 MG tablet, TAKE 1 TABLET BY MOUTH EVERY DAY, Disp: 90 tablet, Rfl: 0   metFORMIN (GLUCOPHAGE-XR) 500 MG 24 hr tablet, TAKE 1 TABLET BY MOUTH TWICE A DAY (Patient taking differently: Take 1,000 mg by mouth daily with breakfast.), Disp: 180 tablet, Rfl: 0   methocarbamol (ROBAXIN) 500 MG tablet, Take 1 tablet (500 mg total) by mouth every 8 (eight) hours as needed for muscle spasms., Disp: 60 tablet, Rfl: 1   metoprolol tartrate (LOPRESSOR) 25 MG tablet, Take 1 tablet (25 mg total) by mouth 2 (two) times daily., Disp: 60 tablet, Rfl: 3   Multiple Vitamins-Minerals (MULTIVITAMIN WITH  MINERALS) tablet, Take 2 tablets by mouth daily., Disp: , Rfl:    Semaglutide,0.25 or 0.5MG /DOS, (OZEMPIC, 0.25 OR 0.5 MG/DOSE,) 2 MG/1.5ML SOPN, Inject 0.5 mg into the skin every Monday., Disp: 2 mL, Rfl: 2   traZODone (DESYREL) 50 MG tablet, Take 1 tablet (50 mg total) by mouth at bedtime as needed. for sleep, Disp: 90 tablet, Rfl: 1  Past Medical History: Past Medical History:  Diagnosis Date   Diabetes mellitus without complication ([place])    Hyperlipidemia    Hypertension     Tobacco Use: Social History   Tobacco Use  Smoking Status Never  Smokeless Tobacco Never    Labs: Recent Review Flowsheet Data     Labs for ITP Cardiac and Pulmonary Rehab Latest Ref Rng & Units 06/19/2021 06/19/2021 06/19/2021 06/19/2021 07/25/2021   Cholestrol 100 - 199 mg/dL - - - - 132   LDLCALC 0 - 99 mg/dL - - - - 77   HDL >39 mg/dL - - - - 34(L)   Trlycerides 0 - 149 mg/dL - - - - 112   Hemoglobin A1c 4.8 - 5.6 % - - - - 7.0(H)   PHART 7.350 - 7.450 - 7.366 7.388 7.310(L) -   PCO2ART 32.0 - 48.0 mmHg - 39.4 32.0 30.1(L) -   HCO3 20.0 - 28.0 mmol/L - 22.7 19.4(L) 15.1(L) -   TCO2 22 - 32 mmol/L 22 24 20(L) 16(L) -  ACIDBASEDEF 0.0 - 2.0 mmol/L - 3.0(H) 5.0(H) 10.0(H) -   O2SAT % - 90.0 95.0 95.0 -       Capillary Blood Glucose: Lab Results  Component Value Date   GLUCAP 158 (H) 06/23/2021   GLUCAP 91 06/23/2021   GLUCAP 191 (H) 06/22/2021   GLUCAP 237 (H) 06/22/2021   GLUCAP 145 (H) 06/22/2021     Exercise Target Goals: Exercise Program Goal: Individual exercise prescription set using results from initial 6 min walk test and THRR while considering  patients activity barriers and safety.   Exercise Prescription Goal: Starting with aerobic activity 30 plus minutes a day, 3 days per week for initial exercise prescription. Provide home exercise prescription and guidelines that participant acknowledges understanding prior to discharge.  Activity Barriers & Risk Stratification:   Activity Barriers & Cardiac Risk Stratification - 09/07/21 1249       Activity Barriers & Cardiac Risk Stratification   Activity Barriers None    Cardiac Risk Stratification High             6 Minute Walk:  6 Minute Walk     Row Name 09/07/21 1404         6 Minute Walk   Phase Initial     Distance 1300 feet     Walk Time 6 minutes     # of Rest Breaks 0     MPH 2.46     METS 3.93     RPE 11     VO2 Peak 13.77     Symptoms No     Resting HR 91 bpm     Resting BP 120/80     Resting Oxygen Saturation  98 %     Exercise Oxygen Saturation  during 6 min walk 99 %     Max Ex. HR 95 bpm     Max Ex. BP 136/72     2 Minute Post BP 118/78              Oxygen Initial Assessment:   Oxygen Re-Evaluation:   Oxygen Discharge (Final Oxygen Re-Evaluation):   Initial Exercise Prescription:  Initial Exercise Prescription - 09/07/21 1400       Date of Initial Exercise RX and Referring Provider   Date 09/07/21    Referring Provider Dr. Kipp Brood    Expected Discharge Date 12/01/21      Treadmill   MPH 2.4    Grade 0    Minutes 22      Recumbant Elliptical   Level 1    RPM 50    Minutes 17      Prescription Details   Frequency (times per week) 3    Duration Progress to 30 minutes of continuous aerobic without signs/symptoms of physical distress      Intensity   THRR 40-80% of Max Heartrate 67-134    Ratings of Perceived Exertion 11-13    Perceived Dyspnea 0-4      Resistance Training   Training Prescription Yes    Weight 4 lbs    Reps 10-15             Perform Capillary Blood Glucose checks as needed.  Exercise Prescription Changes:   Exercise Prescription Changes     Row Name 09/18/21 1200 09/29/21 1000 10/02/21 1200         Response to Exercise   Blood Pressure (Admit) 120/60 -- 120/82     Blood Pressure (Exercise) 136/72 -- 140/80     Blood  Pressure (Exit) 112/76 -- 106/76     Heart Rate (Admit) 84 bpm -- 76 bpm     Heart Rate  (Exercise) 104 bpm -- 104 bpm     Heart Rate (Exit) 93 bpm -- 85 bpm     Rating of Perceived Exertion (Exercise) 11 -- 12     Duration Continue with 30 min of aerobic exercise without signs/symptoms of physical distress. -- Continue with 30 min of aerobic exercise without signs/symptoms of physical distress.     Intensity THRR unchanged -- THRR unchanged       Progression   Progression Continue to progress workloads to maintain intensity without signs/symptoms of physical distress. -- Continue to progress workloads to maintain intensity without signs/symptoms of physical distress.       Resistance Training   Training Prescription Yes -- Yes     Weight 5 -- 4     Reps 10-15 -- 10-15     Time 10 Minutes -- 10 Minutes       Treadmill   MPH 2.6 -- 2.8     Grade 0 -- 0     Minutes 22 -- 22     METs 2.99 -- 3.14       Recumbant Elliptical   Level 2 -- 2     RPM 59 -- 66     Minutes 17 -- 17     METs 3.9 -- 4.4       Home Exercise Plan   Plans to continue exercise at -- Home (comment) --     Frequency -- Add 2 additional days to program exercise sessions. --     Initial Home Exercises Provided -- 09/29/21 --              Exercise Comments:   Exercise Comments     Row Name 09/29/21 1006           Exercise Comments home ecercise reviewed                Exercise Goals and Review:   Exercise Goals     Row Name 09/07/21 1408 10/02/21 1258           Exercise Goals   Increase Physical Activity Yes Yes      Intervention Provide advice, education, support and counseling about physical activity/exercise needs.;Develop an individualized exercise prescription for aerobic and resistive training based on initial evaluation findings, risk stratification, comorbidities and participant's personal goals. Provide advice, education, support and counseling about physical activity/exercise needs.;Develop an individualized exercise prescription for aerobic and resistive training  based on initial evaluation findings, risk stratification, comorbidities and participant's personal goals.      Expected Outcomes Short Term: Attend rehab on a regular basis to increase amount of physical activity.;Long Term: Add in home exercise to make exercise part of routine and to increase amount of physical activity.;Long Term: Exercising regularly at least 3-5 days a week. Short Term: Attend rehab on a regular basis to increase amount of physical activity.;Long Term: Add in home exercise to make exercise part of routine and to increase amount of physical activity.;Long Term: Exercising regularly at least 3-5 days a week.      Increase Strength and Stamina Yes Yes      Intervention Provide advice, education, support and counseling about physical activity/exercise needs.;Develop an individualized exercise prescription for aerobic and resistive training based on initial evaluation findings, risk stratification, comorbidities and participant's personal goals. Provide advice, education, support and counseling about physical activity/exercise  needs.;Develop an individualized exercise prescription for aerobic and resistive training based on initial evaluation findings, risk stratification, comorbidities and participant's personal goals.      Expected Outcomes Short Term: Increase workloads from initial exercise prescription for resistance, speed, and METs.;Short Term: Perform resistance training exercises routinely during rehab and add in resistance training at home;Long Term: Improve cardiorespiratory fitness, muscular endurance and strength as measured by increased METs and functional capacity (6MWT) Short Term: Increase workloads from initial exercise prescription for resistance, speed, and METs.;Short Term: Perform resistance training exercises routinely during rehab and add in resistance training at home;Long Term: Improve cardiorespiratory fitness, muscular endurance and strength as measured by increased  METs and functional capacity (6MWT)      Able to understand and use rate of perceived exertion (RPE) scale Yes Yes      Intervention Provide education and explanation on how to use RPE scale Provide education and explanation on how to use RPE scale      Expected Outcomes Short Term: Able to use RPE daily in rehab to express subjective intensity level;Long Term:  Able to use RPE to guide intensity level when exercising independently Short Term: Able to use RPE daily in rehab to express subjective intensity level;Long Term:  Able to use RPE to guide intensity level when exercising independently      Knowledge and understanding of Target Heart Rate Range (THRR) Yes Yes      Intervention Provide education and explanation of THRR including how the numbers were predicted and where they are located for reference Provide education and explanation of THRR including how the numbers were predicted and where they are located for reference      Expected Outcomes Short Term: Able to state/look up THRR;Long Term: Able to use THRR to govern intensity when exercising independently;Short Term: Able to use daily as guideline for intensity in rehab Short Term: Able to state/look up THRR;Long Term: Able to use THRR to govern intensity when exercising independently;Short Term: Able to use daily as guideline for intensity in rehab      Able to check pulse independently Yes Yes      Intervention Provide education and demonstration on how to check pulse in carotid and radial arteries.;Review the importance of being able to check your own pulse for safety during independent exercise Provide education and demonstration on how to check pulse in carotid and radial arteries.;Review the importance of being able to check your own pulse for safety during independent exercise      Expected Outcomes Short Term: Able to explain why pulse checking is important during independent exercise;Long Term: Able to check pulse independently and  accurately Short Term: Able to explain why pulse checking is important during independent exercise;Long Term: Able to check pulse independently and accurately      Understanding of Exercise Prescription Yes Yes      Intervention Provide education, explanation, and written materials on patient's individual exercise prescription Provide education, explanation, and written materials on patient's individual exercise prescription      Expected Outcomes Short Term: Able to explain program exercise prescription;Long Term: Able to explain home exercise prescription to exercise independently Short Term: Able to explain program exercise prescription;Long Term: Able to explain home exercise prescription to exercise independently               Exercise Goals Re-Evaluation :  Exercise Goals Re-Evaluation     [place] Name 10/02/21 1258  Exercise Goal Re-Evaluation   Exercise Goals Review Increase Physical Activity;Increase Strength and Stamina;Able to understand and use rate of perceived exertion (RPE) scale;Knowledge and understanding of Target Heart Rate Range (THRR);Able to check pulse independently;Understanding of Exercise Prescription       Comments Pt has completed 10 sessions of cardiac rehab. He has been able to progress his workloads and is eager to improve his fitness. He is currently exercising at 4.4 METs. Will continue to monitor and progress as able.       Expected Outcomes Through exercise at home and at rehab, the patient will meet their stated goals.                 Discharge Exercise Prescription (Final Exercise Prescription Changes):  Exercise Prescription Changes - 10/02/21 1200       Response to Exercise   Blood Pressure (Admit) 120/82    Blood Pressure (Exercise) 140/80    Blood Pressure (Exit) 106/76    Heart Rate (Admit) 76 bpm    Heart Rate (Exercise) 104 bpm    Heart Rate (Exit) 85 bpm    Rating of Perceived Exertion (Exercise) 12    Duration Continue  with 30 min of aerobic exercise without signs/symptoms of physical distress.    Intensity THRR unchanged      Progression   Progression Continue to progress workloads to maintain intensity without signs/symptoms of physical distress.      Resistance Training   Training Prescription Yes    Weight 4    Reps 10-15    Time 10 Minutes      Treadmill   MPH 2.8    Grade 0    Minutes 22    METs 3.14      Recumbant Elliptical   Level 2    RPM 66    Minutes 17    METs 4.4             Nutrition:  Target Goals: Understanding of nutrition guidelines, daily intake of sodium 1500mg , cholesterol 200mg , calories 30% from fat and 7% or less from saturated fats, daily to have 5 or more servings of fruits and vegetables.  Biometrics:  Pre Biometrics - 09/07/21 1408       Pre Biometrics   Height 5\' 7"  (1.702 m)    Weight 73.7 kg    Waist Circumference 35 inches    Hip Circumference 37 inches    Waist to Hip Ratio 0.95 %    BMI (Calculated) 25.44    Triceps Skinfold 9 mm    % Body Fat 21.9 %    Grip Strength 47.7 kg    Flexibility 7.75 in    Single Leg Stand 10.16 seconds              Nutrition Therapy Plan and Nutrition Goals:  Nutrition Therapy & Goals - 09/07/21 1332       Personal Nutrition Goals   Additional Goals? No    Comments Patient scored 25 on his diet assessment. Handout provided and explained regarding healthier choices and DM informataion. Patient verbalized understanding. We offer 2 educational sessions on heart healthy nutrition with handouts and assistance with RD referral if patient is interested. He says he and his wife.are following a dash heart healthy diet.      Intervention Plan   Intervention Nutrition handout(s) given to patient.    Expected Outcomes Short Term Goal: Understand basic principles of dietary content, such as calories, fat, sodium, cholesterol and nutrients.  Nutrition Assessments:  Nutrition Assessments -  09/07/21 1332       MEDFICTS Scores   Pre Score 25            MEDIFICTS Score Key: ?70 Need to make dietary changes  40-70 Heart Healthy Diet ? 40 Therapeutic Level Cholesterol Diet   Picture Your Plate Scores: [id number] Unhealthy dietary pattern with much room for improvement. 41-50 Dietary pattern unlikely to meet recommendations for good health and room for improvement. 51-60 More healthful dietary pattern, with some room for improvement.  >60 Healthy dietary pattern, although there may be some specific behaviors that could be improved.    Nutrition Goals Re-Evaluation:   Nutrition Goals Discharge (Final Nutrition Goals Re-Evaluation):   Psychosocial: Target Goals: Acknowledge presence or absence of significant depression and/or stress, maximize coping skills, provide positive support system. Participant is able to verbalize types and ability to use techniques and skills needed for reducing stress and depression.  Initial Review & Psychosocial Screening:  Initial Psych Review & Screening - 09/07/21 1413       Initial Review   Current issues with None Identified      Family Dynamics   Good Support System? Yes      Barriers   Psychosocial barriers to participate in program There are no identifiable barriers or psychosocial needs.      Screening Interventions   Interventions Encouraged to exercise    Expected Outcomes Short Term goal: Identification and review with participant of any Quality of Life or Depression concerns found by scoring the questionnaire.             Quality of Life Scores:  Quality of Life - 09/07/21 1404       Quality of Life   Select Quality of Life      Quality of Life Scores   Health/Function Pre 25.87 %    Socioeconomic Pre 27.5 %    Psych/Spiritual Pre 28.57 %    Family Pre 28.3 %    GLOBAL Pre 27.12 %            Scores of 19 and below usually indicate a poorer quality of life in these areas.  A difference of  2-3 points  is a clinically meaningful difference.  A difference of 2-3 points in the total score of the Quality of Life Index has been associated with significant improvement in overall quality of life, self-image, physical symptoms, and general health in studies assessing change in quality of life.  PHQ-9: Recent Review Flowsheet Data     Depression screen [facility] 2/9 09/08/2021 09/07/2021 07/25/2021 04/27/2021 02/21/2021   Decreased Interest 0 0 0 0 0   Down, Depressed, Hopeless 0 0 - 0 0   PHQ - 2 Score 0 0 0 0 0   Altered sleeping 1 0 - 0 0   Tired, decreased energy 0 1 - 0 0   Change in appetite 0 0 - 0 0   Feeling bad or failure about yourself  0 0 - 0 0   Trouble concentrating 0 0 - 0 0   Moving slowly or fidgety/restless 0 0 - 0 0   Suicidal thoughts 0 0 - 0 0   PHQ-9 Score 1 1 - 0 0   Difficult doing work/chores Not difficult at all Not difficult at all - Not difficult at all Not difficult at all      Interpretation of Total Score  Total Score Depression Severity:  1-4 = Minimal depression, 5-9 =  Mild depression, 10-14 = Moderate depression, 15-19 = Moderately severe depression, 20-27 = Severe depression   Psychosocial Evaluation and Intervention:  Psychosocial Evaluation - 09/07/21 1414       Psychosocial Evaluation & Interventions   Interventions Stress management education;Relaxation education;Encouraged to exercise with the program and follow exercise prescription    Comments Patient has no psychosocial barriers or needs identified at his orientation visit. His PHQ-9 score was 1. He denies any depression, anxiety, or stress concerns. 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 He says he only took 1 or 2 times. He is currently sleeping well without medication. 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 He lives with his wife of many years.  They have 4 children and 3 grandchildren. All of his children live out of town but he says he is very close with all of them. He names his wife as his main support and says he also has several pastor friends that are very supportative. He is looking forward to doing the program hoping to get back to his normal activities and gain his strength back.    Expected Outcomes Patient will continue to have no psychosocial barriers or issues identified.    Continue Psychosocial Services  No Follow up required             Psychosocial Re-Evaluation:  Psychosocial Re-Evaluation     [place] Name 09/25/21 1432             Psychosocial Re-Evaluation   Current issues with None Identified       Comments Patient is new to the program completing 6 sessions. He continues to have no psychosocial barriers or issues identified. He seems to enjoy coming to the program and demonstrates an interest in improving his health. We will continue to monitor.       Expected Outcomes Patient will continue to have no psychosocial barriers or issues identified.       Interventions Stress management education;Encouraged to attend Cardiac Rehabilitation for the exercise;Relaxation education       Continue Psychosocial Services  No Follow up required                Psychosocial Discharge (Final Psychosocial Re-Evaluation):  Psychosocial Re-Evaluation - 09/25/21 1432       Psychosocial Re-Evaluation   Current issues with None Identified    Comments Patient is new to the program completing 6 sessions. He continues to have no psychosocial barriers or issues identified. He seems to enjoy coming to the program and demonstrates an interest in improving his health. We will continue to monitor.    Expected Outcomes Patient will continue to have no psychosocial barriers or issues identified.    Interventions Stress management education;Encouraged to attend Cardiac Rehabilitation for the exercise;Relaxation education     Continue Psychosocial Services  No Follow up required             Vocational Rehabilitation: Provide vocational rehab assistance to qualifying candidates.   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  Education: Education Goals: Education classes will be provided on a weekly basis, covering required topics. Participant will state understanding/return demonstration of topics presented.  Learning Barriers/Preferences:  Learning Barriers/Preferences - 09/07/21 1410       Learning Barriers/Preferences   Learning Barriers None    Learning Preferences Audio;Written Material;Skilled Demonstration             Education Topics: Hypertension, Hypertension Reduction -Define heart disease and high blood pressure. Discus how high blood pressure affects the body and ways to reduce high blood pressure. Flowsheet Row CARDIAC REHAB PHASE II EXERCISE from 09/27/2021 in [place]  Date 09/13/21  Educator DF  Instruction Review Code 2- Demonstrated Understanding       Exercise and Your Heart -Discuss why it is important to exercise, the FITT principles of exercise, normal and abnormal responses to exercise, and how to exercise safely.   Angina -Discuss definition of angina, causes of angina, treatment of angina, and how to decrease risk of having angina. Flowsheet Row CARDIAC REHAB PHASE II EXERCISE from 09/27/2021 in [place]  Date 09/27/21  Educator pb  Instruction Review Code 1- Verbalizes Understanding       Cardiac Medications -Review what the following cardiac medications are used for, how they affect the body, and side effects that may occur when taking  the medications.  Medications include Aspirin, Beta blockers, calcium channel blockers, ACE Inhibitors, angiotensin receptor blockers, diuretics, digoxin, and antihyperlipidemics.   Congestive Heart Failure -Discuss the definition of CHF, how to live with CHF, the signs and symptoms of CHF, and how keep track of weight and sodium intake.   Heart Disease and Intimacy -Discus the effect sexual activity has on the heart, how changes occur during intimacy as we age, and safety during sexual activity.   Smoking Cessation / COPD -Discuss different methods to quit smoking, the health benefits of quitting smoking, and the definition of COPD.   Nutrition I: Fats -Discuss the types of cholesterol, what cholesterol does to the heart, and how cholesterol levels can be controlled.   Nutrition II: Labels -Discuss the different components of food labels and how to read food label   Heart Parts/Heart Disease and PAD -Discuss the anatomy of the heart, the pathway of blood circulation through the heart, and these are affected by heart disease.   Stress I: Signs and Symptoms -Discuss the causes of stress, how stress may lead to anxiety and depression, and ways to limit stress.   Stress II: Relaxation -Discuss different types of relaxation techniques to limit stress.   Warning Signs of Stroke / TIA -Discuss definition of a stroke, what the signs and symptoms are of a stroke, and how to identify when someone is having stroke.   Knowledge Questionnaire Score:  Knowledge Questionnaire Score - 09/07/21 1336       Knowledge Questionnaire Score   Pre Score 20/24             Core Components/Risk Factors/Patient Goals at Admission:  Personal Goals and Risk Factors at Admission - 09/07/21 1410       Core Components/Risk Factors/Patient Goals on Admission    Weight Management Weight Maintenance    Diabetes Yes    Intervention Provide education about signs/symptoms and action to take for  hypo/hyperglycemia.;Provide education about proper nutrition, including hydration, and aerobic/resistive exercise prescription along with prescribed medications to achieve blood glucose in normal ranges: Fasting glucose 65-99 mg/dL    Expected Outcomes Short Term: Participant verbalizes understanding  of the signs/symptoms and immediate care of hyper/hypoglycemia, proper foot care and importance of medication, aerobic/resistive exercise and nutrition plan for blood glucose control.;Long Term: Attainment of HbA1C < 7%.    Lipids Yes    Intervention Provide education and support for participant on nutrition & aerobic/resistive exercise along with prescribed medications to achieve LDL 70mg , HDL >40mg .    Expected Outcomes Short Term: Participant states understanding of desired cholesterol values and is compliant with medications prescribed. Participant is following exercise prescription and nutrition guidelines.;Long Term: Cholesterol controlled with medications as prescribed, with individualized exercise RX and with personalized nutrition plan. Value goals: LDL < 70mg , HDL > 40 mg.    Personal Goal Other Yes    Personal Goal Patient wants to get stronger; recovy from his surgery and get back to his ADL's and working full-time as [employer] manager.    Intervention Patient will attend the CR program with exercise and education and supplement with exercise at home.    Expected Outcomes Patient will complete the program meeting both personal and program goals.             Core Components/Risk Factors/Patient Goals Review:   Goals and Risk Factor Review     Row Name 09/25/21 1427             Core Components/Risk Factors/Patient Goals Review   Personal Goals Review Weight Management/Obesity;Diabetes;Lipids;Other       Review Patient was referred to CR with NSTEMI. He has multiple risk factors for CAD and is participating in the program for risk modification. He has completed 6 sessions with his current  weight being 162.7 lbs. His intial weight was 162.0 lbs. His blood pressure is at goal. He saw his pcp 09/08/21. She increased Ozempic to 0.5 mg for tighter DM control with a goal of A1C <7%. Last was at 7.0% in November which has trended down form 11.7% 9 months ago. His personal goals for the program are to get stronger; recover from surgery and get back to his ADL's. We will continue to monitor his progress as he works towards meeting these goals.       Expected Outcomes Patient will complete the program meeting both personal and program goals.                Core Components/Risk Factors/Patient Goals at Discharge (Final Review):   Goals and Risk Factor Review - 09/25/21 1427       Core Components/Risk Factors/Patient Goals Review   Personal Goals Review Weight Management/Obesity;Diabetes;Lipids;Other    Review Patient was referred to CR with NSTEMI. He has multiple risk factors for CAD and is participating in the program for risk modification. He has completed 6 sessions with his current weight being 162.7 lbs. His intial weight was 162.0 lbs. His blood pressure is at goal. He saw his pcp 09/08/21. She increased Ozempic to 0.5 mg for tighter DM control with a goal of A1C <7%. Last was at 7.0% in November which has trended down form 11.7% 9 months ago. His personal goals for the program are to get stronger; recover from surgery and get back to his ADL's. We will continue to monitor his progress as he works towards meeting these goals.    Expected Outcomes Patient will complete the program meeting both personal and program goals.             ITP Comments:   Comments: ITP REVIEW Pt is making expected progress toward Cardiac Rehab goals after completing 10 sessions. Recommend  continued exercise, life style modification, education, and increased stamina and strength.

## 2021-10-04 NOTE — Progress Notes (Signed)
Daily Session Note  Patient Details  Name: Leroi Haque MRN: [id number] Date of Birth: 11/06/1965 Referring Provider:   Flowsheet Row CARDIAC REHAB PHASE II ORIENTATION from 09/07/2021 in [place]  Referring Provider Dr. Kipp Brood       Encounter Date: 10/04/2021  Check In:  Session Check In - 10/04/21 0930       Check-In   Supervising physician immediately available to respond to emergencies [facility] MD immediately available    Physician(s) Dr. Harl Bowie    Location [facility]    Staff Present Hoy Register, MS, ACSM-CEP, Exercise Physiologist;Happy Begeman Zigmund Daniel, Exercise Physiologist;Other    Virtual Visit No    Medication changes reported     No    Fall or balance concerns reported    No    Tobacco Cessation No Change    Warm-up and Cool-down Performed as group-led instruction    Resistance Training Performed Yes    VAD Patient? No    PAD/SET Patient? No      Pain Assessment   Currently in Pain? No/denies    Pain Score 0-No pain    Multiple Pain Sites No             Capillary Blood Glucose: No results found for this or any previous visit (from the past 24 hour(s)).    Social History   Tobacco Use  Smoking Status Never  Smokeless Tobacco Never    Goals Met:  Independence with exercise equipment Exercise tolerated well No report of concerns or symptoms today Strength training completed today  Goals Unmet:  Not Applicable  Comments: check out 1030   Dr. Kathie Dike is Medical Director for [facility] Pulmonary Re[facility].

## 2021-10-06 ENCOUNTER — Encounter (HOSPITAL_COMMUNITY)
Admission: RE | Admit: 2021-10-06 | Discharge: 2021-10-06 | Disposition: A | Discharge status: Home / Self Care | Payer: 59 | Source: Ambulatory Visit | Attending: Cardiovascular Disease | Admitting: Cardiovascular Disease

## 2021-10-06 DIAGNOSIS — I214 Non-ST elevation (NSTEMI) myocardial infarction: Secondary | ICD-10-CM

## 2021-10-06 DIAGNOSIS — Z951 Presence of aortocoronary bypass graft: Secondary | ICD-10-CM

## 2021-10-06 NOTE — Progress Notes (Signed)
Daily Session Note  Patient Details  Name: Matthew Mann MRN: [id number] Date of Birth: [birth year] Referring Provider:   Flowsheet Row CARDIAC REHAB PHASE II ORIENTATION from 09/07/2021 in [place]  Referring Provider Dr. Kipp Brood       Encounter Date: 10/06/2021  Check In:  Session Check In - 10/06/21 0928       Check-In   Supervising physician immediately available to respond to emergencies [facility] immediately available    Physician(s) Dr. Harrington Challenger    Location [facility]    Staff Present Hoy Register, MS, ACSM-CEP, Exercise Physiologist;Heather Otho Ket, BS, Exercise Physiologist;Tajah Schreiner Wynetta Emery, RN, BSN    Virtual Visit No    Medication changes reported     No    Fall or balance concerns reported    No    Tobacco Cessation No Change    Warm-up and Cool-down Performed as group-led instruction    Resistance Training Performed Yes    VAD Patient? No    PAD/SET Patient? No      Pain Assessment   Currently in Pain? No/denies    Pain Score 0-No pain    Multiple Pain Sites No             Capillary Blood Glucose: No results found for this or any previous visit (from the past 24 hour(s)).    Social History   Tobacco Use  Smoking Status Never  Smokeless Tobacco Never    Goals Met:  Independence with exercise equipment Exercise tolerated well No report of concerns or symptoms today Strength training completed today  Goals Unmet:  Not Applicable  Comments: Check out 1030.   Dr. Kathie Dike is Medical Director for [facility] Pulmonary Re[facility].

## 2021-10-09 ENCOUNTER — Encounter (HOSPITAL_COMMUNITY)
Admission: RE | Admit: 2021-10-09 | Discharge: 2021-10-09 | Disposition: A | Discharge status: Home / Self Care | Payer: 59 | Source: Ambulatory Visit | Attending: Cardiovascular Disease | Admitting: Cardiovascular Disease

## 2021-10-09 DIAGNOSIS — Z951 Presence of aortocoronary bypass graft: Secondary | ICD-10-CM

## 2021-10-09 DIAGNOSIS — I214 Non-ST elevation (NSTEMI) myocardial infarction: Secondary | ICD-10-CM

## 2021-10-09 NOTE — Progress Notes (Signed)
Daily Session Note ° °Patient Details  °Name: Matthew Mann °MRN: [id number] °Date of Birth: 09/21/1965 °Referring Provider:   °Flowsheet Row CARDIAC REHAB PHASE II ORIENTATION from 09/07/2021 in [place] CARDIAC RE[facility]ILITATION  °Referring Provider Dr. Lightfoot  ° °  ° ° °Encounter Date: 10/09/2021 ° °Check In: ° Session Check In - 10/09/21 0930   ° °  ° Check-In  ° Supervising physician immediately available to respond to emergencies [facility] immediately available   ° Physician(s) Dr. O'Neal   ° Location [facility]   ° Staff Present Dalton Fletcher, MS, ACSM-CEP, Exercise Physiologist;Heather Jachimiak, BS, Exercise Physiologist;Debra Johnson, RN, BSN   ° Virtual Visit No   ° Medication changes reported     No   ° Fall or balance concerns reported    No   ° Tobacco Cessation No Change   ° Warm-up and Cool-down Performed as group-led instruction   ° Resistance Training Performed Yes   ° VAD Patient? No   ° PAD/SET Patient? No   °  ° Pain Assessment  ° Currently in Pain? No/denies   ° Pain Score 0-No pain   ° Multiple Pain Sites No   ° °  °  ° °  ° ° °Capillary Blood Glucose: °No results found for this or any previous visit (from the past 24 hour(s)). ° ° ° °Social History  ° °Tobacco Use  °Smoking Status Never  °Smokeless Tobacco Never  ° ° °Goals Met:  °Independence with exercise equipment °Exercise tolerated well °No report of concerns or symptoms today °Strength training completed today ° °Goals Unmet:  °Not Applicable ° °Comments: Check out 1030. ° ° °Dr. Jonathan Branch is Medical Director for  Cardiac Rehab °

## 2021-10-11 ENCOUNTER — Encounter (HOSPITAL_COMMUNITY)
Admission: RE | Admit: 2021-10-11 | Discharge: 2021-10-11 | Disposition: A | Discharge status: Home / Self Care | Payer: 59 | Source: Ambulatory Visit | Attending: Cardiovascular Disease | Admitting: Cardiovascular Disease

## 2021-10-11 DIAGNOSIS — I214 Non-ST elevation (NSTEMI) myocardial infarction: Secondary | ICD-10-CM | POA: Diagnosis not present

## 2021-10-11 DIAGNOSIS — Z951 Presence of aortocoronary bypass graft: Secondary | ICD-10-CM

## 2021-10-11 NOTE — Progress Notes (Signed)
Daily Session Note  Patient Details  Name: Ramy Greth MRN: [id number] Date of Birth: Jan 25, 1966 Referring Provider:   Flowsheet Row CARDIAC REHAB PHASE II ORIENTATION from 09/07/2021 in [place]  Referring Provider Dr. Kipp Brood       Encounter Date: 10/11/2021  Check In:  Session Check In - 10/11/21 0930       Check-In   Supervising physician immediately available to respond to emergencies [facility] immediately available    Physician(s) Dr. Johnsie Cancel    Location [facility]    Staff Present Jilda Roche, RN, Bjorn Loser, MS, ACSM-CEP, Exercise Physiologist;Ziyana Morikawa Zigmund Daniel, Exercise Physiologist    Virtual Visit No    Medication changes reported     No    Fall or balance concerns reported    No    Tobacco Cessation No Change    Warm-up and Cool-down Performed as group-led instruction    Resistance Training Performed Yes    VAD Patient? No    PAD/SET Patient? No      Pain Assessment   Currently in Pain? No/denies    Pain Score 0-No pain    Multiple Pain Sites No             Capillary Blood Glucose: No results found for this or any previous visit (from the past 24 hour(s)).    Social History   Tobacco Use  Smoking Status Never  Smokeless Tobacco Never    Goals Met:  Independence with exercise equipment Exercise tolerated well No report of concerns or symptoms today Strength training completed today  Goals Unmet:  Not Applicable  Comments: check out 1030   Dr. Carlyle Dolly is Medical Director for [place]

## 2021-10-13 ENCOUNTER — Encounter (HOSPITAL_COMMUNITY)
Admission: RE | Admit: 2021-10-13 | Discharge: 2021-10-13 | Disposition: A | Discharge status: Home / Self Care | Payer: 59 | Source: Ambulatory Visit | Attending: Cardiovascular Disease | Admitting: Cardiovascular Disease

## 2021-10-13 DIAGNOSIS — Z951 Presence of aortocoronary bypass graft: Secondary | ICD-10-CM

## 2021-10-13 DIAGNOSIS — I214 Non-ST elevation (NSTEMI) myocardial infarction: Secondary | ICD-10-CM | POA: Diagnosis not present

## 2021-10-13 NOTE — Progress Notes (Signed)
Daily Session Note  Patient Details  Name: Matthew Mann MRN: [id number] Date of Birth: [birth year] Referring Provider:   Flowsheet Row CARDIAC REHAB PHASE II ORIENTATION from 09/07/2021 in [place]  Referring Provider Dr. Kipp Brood       Encounter Date: 10/13/2021  Check In:  Session Check In - 10/13/21 0930       Check-In   Supervising physician immediately available to respond to emergencies [facility] immediately available    Physician(s) Dr Marlou Porch    Location [facility]    Staff Present Aundra Dubin, RN, Madlyn Frankel, RN, Bjorn Loser, MS, ACSM-CEP, Exercise Physiologist;Heather Zigmund Devika Dragovich, Exercise Physiologist    Virtual Visit No    Medication changes reported     No    Fall or balance concerns reported    No    Tobacco Cessation No Change    Warm-up and Cool-down Performed as group-led instruction    Resistance Training Performed Yes    VAD Patient? No    PAD/SET Patient? No      Pain Assessment   Currently in Pain? No/denies    Pain Score 0-No pain             Capillary Blood Glucose: No results found for this or any previous visit (from the past 24 hour(s)).    Social History   Tobacco Use  Smoking Status Never  Smokeless Tobacco Never    Goals Met:  Independence with exercise equipment Exercise tolerated well No report of concerns or symptoms today Strength training completed today  Goals Unmet:  Not Applicable  Comments: checkout 1030    Dr. Carlyle Dolly is Medical Director for [place]

## 2021-10-14 ENCOUNTER — Other Ambulatory Visit: Payer: Self-pay | Admitting: Physician Assistant

## 2021-10-14 DIAGNOSIS — E1141 Type 2 diabetes mellitus with diabetic mononeuropathy: Secondary | ICD-10-CM

## 2021-10-14 DIAGNOSIS — E782 Mixed hyperlipidemia: Secondary | ICD-10-CM

## 2021-10-16 ENCOUNTER — Encounter (HOSPITAL_COMMUNITY)
Admission: RE | Admit: 2021-10-16 | Discharge: 2021-10-16 | Disposition: A | Discharge status: Home / Self Care | Payer: 59 | Source: Ambulatory Visit | Attending: Cardiovascular Disease | Admitting: Cardiovascular Disease

## 2021-10-16 VITALS — Wt 162.9 lb

## 2021-10-16 DIAGNOSIS — I214 Non-ST elevation (NSTEMI) myocardial infarction: Secondary | ICD-10-CM

## 2021-10-16 DIAGNOSIS — Z951 Presence of aortocoronary bypass graft: Secondary | ICD-10-CM

## 2021-10-16 NOTE — Progress Notes (Signed)
Daily Session Note  Patient Details  Name: Matthew Mann MRN: [id number] Date of Birth: Aug 16, 1966 Referring Provider:   Flowsheet Row CARDIAC REHAB PHASE II ORIENTATION from 09/07/2021 in [place]  Referring Provider Dr. Kipp Brood       Encounter Date: 10/16/2021  Check In:  Session Check In - 10/16/21 0930       Check-In   Supervising physician immediately available to respond to emergencies [facility] immediately available    Physician(s) Dr Harl Bowie    Location [facility]    Staff Present Geanie Cooley, RN;Danny Russella Dar, RN, Bjorn Loser, MS, ACSM-CEP, Exercise Physiologist;Heather Zigmund Janashia Parco, Exercise Physiologist    Virtual Visit No    Medication changes reported     No    Fall or balance concerns reported    No    Tobacco Cessation No Change    Warm-up and Cool-down Performed as group-led instruction    Resistance Training Performed Yes    VAD Patient? No    PAD/SET Patient? No      Pain Assessment   Currently in Pain? No/denies    Pain Score 0-No pain             Capillary Blood Glucose: No results found for this or any previous visit (from the past 24 hour(s)).    Social History   Tobacco Use  Smoking Status Never  Smokeless Tobacco Never    Goals Met:  Independence with exercise equipment Exercise tolerated well Strength training completed today  Goals Unmet:  Not Applicable  Comments: checkout 1030    Dr. Carlyle Dolly is Medical Director for [place]

## 2021-10-17 ENCOUNTER — Other Ambulatory Visit: Payer: Self-pay | Admitting: Physician Assistant

## 2021-10-18 ENCOUNTER — Encounter (HOSPITAL_COMMUNITY): Payer: 59

## 2021-10-20 ENCOUNTER — Encounter (HOSPITAL_COMMUNITY): Payer: 59

## 2021-10-20 ENCOUNTER — Other Ambulatory Visit: Payer: Self-pay | Admitting: Family Medicine

## 2021-10-20 DIAGNOSIS — E1141 Type 2 diabetes mellitus with diabetic mononeuropathy: Secondary | ICD-10-CM

## 2021-10-20 LAB — HM DIABETES EYE EXAM

## 2021-10-22 ENCOUNTER — Other Ambulatory Visit: Payer: Self-pay | Admitting: Physician Assistant

## 2021-10-23 ENCOUNTER — Telehealth: Payer: Self-pay | Admitting: Cardiovascular Disease

## 2021-10-23 ENCOUNTER — Other Ambulatory Visit: Payer: Self-pay

## 2021-10-23 ENCOUNTER — Encounter (HOSPITAL_COMMUNITY)
Admission: RE | Admit: 2021-10-23 | Discharge: 2021-10-23 | Disposition: A | Discharge status: Home / Self Care | Payer: 59 | Source: Ambulatory Visit | Attending: Cardiovascular Disease | Admitting: Cardiovascular Disease

## 2021-10-23 DIAGNOSIS — I214 Non-ST elevation (NSTEMI) myocardial infarction: Secondary | ICD-10-CM | POA: Diagnosis not present

## 2021-10-23 DIAGNOSIS — Z951 Presence of aortocoronary bypass graft: Secondary | ICD-10-CM

## 2021-10-23 MED ORDER — METOPROLOL TARTRATE 25 MG PO TABS: 25.0000 mg | ORAL_TABLET | Freq: Two times a day (BID) | ORAL | 1 refills | Status: DC

## 2021-10-23 MED ORDER — CLOPIDOGREL BISULFATE 75 MG PO TABS: 75.0000 mg | ORAL_TABLET | Freq: Every day | ORAL | 1 refills | Status: DC

## 2021-10-23 NOTE — Telephone Encounter (Signed)
Called patient, per last OV from Edd Fabian, NP back in November patient was to continue medications. I sent medication to the pharmacy for a month supply as patient will be seeing Dr.O'Neal in March.  Patient verbalized understanding.

## 2021-10-23 NOTE — Telephone Encounter (Signed)
Pt c/o medication issue:  1. Name of Medication: clopidogrel (PLAVIX) 75 MG tablet  metoprolol tartrate (LOPRESSOR) 25 MG tablet  2. How are you currently taking this medication (dosage and times per day)? Take 1 tablet (75 mg total) by mouth daily  Take 1 tablet (25 mg total) by mouth 2 (two) times daily.  3. Are you having a reaction (difficulty breathing--STAT)? no  4. What is your medication issue? Patient calling in to see if he still needs to be tasking this medication. Please advise

## 2021-10-23 NOTE — Progress Notes (Signed)
Daily Session Note  Patient Details  Name: Matthew Mann MRN: [id number] Date of Birth: 08-Nov-1965 Referring Provider:   Flowsheet Row CARDIAC REHAB PHASE II ORIENTATION from 09/07/2021 in [place]  Referring Provider Dr. Kipp Brood       Encounter Date: 10/23/2021  Check In:  Session Check In - 10/23/21 0930       Check-In   Supervising physician immediately available to respond to emergencies [facility] MD immediately available    Physician(s) Dr Harl Bowie    Location [facility]    Staff Present Redge Gainer, BS, Exercise Physiologist;Mieke Brinley Kris Mouton, MS, ACSM-CEP, Exercise Physiologist;Debra Wynetta Emery, RN, BSN    Virtual Visit No    Medication changes reported     No    Fall or balance concerns reported    No    Tobacco Cessation No Change    Warm-up and Cool-down Performed as group-led instruction    Resistance Training Performed Yes    VAD Patient? No    PAD/SET Patient? No      Pain Assessment   Currently in Pain? No/denies    Pain Score 0-No pain    Multiple Pain Sites No             Capillary Blood Glucose: No results found for this or any previous visit (from the past 24 hour(s)).    Social History   Tobacco Use  Smoking Status Never  Smokeless Tobacco Never    Goals Met:  Independence with exercise equipment Exercise tolerated well No report of concerns or symptoms today Strength training completed today  Goals Unmet:  Not Applicable  Comments: checkout time is 1030   Dr. Carlyle Dolly is Medical Director for [place]

## 2021-10-25 ENCOUNTER — Encounter (HOSPITAL_COMMUNITY)
Admission: RE | Admit: 2021-10-25 | Discharge: 2021-10-25 | Disposition: A | Discharge status: Home / Self Care | Payer: 59 | Source: Ambulatory Visit | Attending: Cardiovascular Disease | Admitting: Cardiovascular Disease

## 2021-10-25 DIAGNOSIS — I214 Non-ST elevation (NSTEMI) myocardial infarction: Secondary | ICD-10-CM | POA: Diagnosis not present

## 2021-10-25 DIAGNOSIS — Z951 Presence of aortocoronary bypass graft: Secondary | ICD-10-CM

## 2021-10-25 NOTE — Progress Notes (Signed)
Daily Session Note  Patient Details  Name: Matthew Mann MRN: [id number] Date of Birth: Sep 27, 1965 Referring Provider:   Flowsheet Row CARDIAC REHAB PHASE II ORIENTATION from 09/07/2021 in [place]  Referring Provider Dr. Kipp Brood       Encounter Date: 10/25/2021  Check In:  Session Check In - 10/25/21 0930       Check-In   Supervising physician immediately available to respond to emergencies [facility] MD immediately available    Physician(s) Dr. Domenic Polite    Location [facility]    Staff Present Hoy Register, MS, ACSM-CEP, Exercise Physiologist;Heather Otho Ket, BS, Exercise Physiologist;Hazel Leveille Wynetta Emery, RN, BSN    Virtual Visit No    Medication changes reported     No    Fall or balance concerns reported    No    Tobacco Cessation No Change    Warm-up and Cool-down Performed as group-led instruction    Resistance Training Performed Yes    VAD Patient? No    PAD/SET Patient? No      Pain Assessment   Currently in Pain? No/denies    Pain Score 0-No pain    Multiple Pain Sites No             Capillary Blood Glucose: No results found for this or any previous visit (from the past 24 hour(s)).    Social History   Tobacco Use  Smoking Status Never  Smokeless Tobacco Never    Goals Met:  Independence with exercise equipment Exercise tolerated well No report of concerns or symptoms today Strength training completed today  Goals Unmet:  Not Applicable  Comments: Check out 1030.   Dr. Carlyle Dolly is Medical Director for [facility] Card[facility] Re[facility]

## 2021-10-27 ENCOUNTER — Encounter (HOSPITAL_COMMUNITY)
Admission: RE | Admit: 2021-10-27 | Discharge: 2021-10-27 | Disposition: A | Discharge status: Home / Self Care | Payer: 59 | Source: Ambulatory Visit | Attending: Cardiovascular Disease | Admitting: Cardiovascular Disease

## 2021-10-27 DIAGNOSIS — I214 Non-ST elevation (NSTEMI) myocardial infarction: Secondary | ICD-10-CM | POA: Diagnosis not present

## 2021-10-27 DIAGNOSIS — Z951 Presence of aortocoronary bypass graft: Secondary | ICD-10-CM

## 2021-10-27 NOTE — Progress Notes (Signed)
Daily Session Note  Patient Details  Name: Zakari Bathe MRN: [id number] Date of Birth: 1965/12/17 Referring Provider:   Flowsheet Row CARDIAC REHAB PHASE II ORIENTATION from 09/07/2021 in [place]  Referring Provider Dr. Kipp Brood       Encounter Date: 10/27/2021  Check In:  Session Check In - 10/27/21 0930       Check-In   Supervising physician immediately available to respond to emergencies [facility] MD immediately available    Physician(s) Dr. Domenic Polite    Location [facility]    Staff Present Redge Gainer, BS, Exercise Physiologist;Tyde Lamison Kris Mouton, MS, ACSM-CEP, Exercise Physiologist;Other    Virtual Visit No    Medication changes reported     No    Fall or balance concerns reported    No    Tobacco Cessation No Change    Warm-up and Cool-down Performed as group-led instruction    Resistance Training Performed Yes    VAD Patient? No    PAD/SET Patient? No      Pain Assessment   Currently in Pain? No/denies    Pain Score 0-No pain    Multiple Pain Sites No             Capillary Blood Glucose: No results found for this or any previous visit (from the past 24 hour(s)).    Social History   Tobacco Use  Smoking Status Never  Smokeless Tobacco Never    Goals Met:  Independence with exercise equipment Exercise tolerated well No report of concerns or symptoms today Strength training completed today  Goals Unmet:  Not Applicable  Comments: checkout time is 1030   Dr. Carlyle Dolly is Medical Director for [place]

## 2021-10-30 ENCOUNTER — Encounter (HOSPITAL_COMMUNITY)
Admission: RE | Admit: 2021-10-30 | Discharge: 2021-10-30 | Disposition: A | Discharge status: Home / Self Care | Payer: 59 | Source: Ambulatory Visit | Attending: Cardiovascular Disease | Admitting: Cardiovascular Disease

## 2021-10-30 VITALS — Wt 164.7 lb

## 2021-10-30 DIAGNOSIS — I214 Non-ST elevation (NSTEMI) myocardial infarction: Secondary | ICD-10-CM

## 2021-10-30 NOTE — Progress Notes (Signed)
Daily Session Note  Patient Details  Name: Matthew Mann MRN: [id number] Date of Birth: [birth year] Referring Provider:   Flowsheet Row CARDIAC REHAB PHASE II ORIENTATION from 09/07/2021 in [place]  Referring Provider Dr. Kipp Brood       Encounter Date: 10/30/2021  Check In:  Session Check In - 10/30/21 0930       Check-In   Supervising physician immediately available to respond to emergencies [facility] MD immediately available    Physician(s) Dr Harl Bowie    Location [facility]    Staff Present Jilda Roche, RN, Bjorn Loser, MS, ACSM-CEP, Exercise Physiologist;Heather Zigmund Kailena Lubas, Exercise Physiologist    Virtual Visit No    Medication changes reported     No    Fall or balance concerns reported    No    Tobacco Cessation No Change    Warm-up and Cool-down Performed as group-led instruction    Resistance Training Performed Yes    VAD Patient? No      Pain Assessment   Currently in Pain? No/denies    Pain Score 0-No pain             Capillary Blood Glucose: No results found for this or any previous visit (from the past 24 hour(s)).    Social History   Tobacco Use  Smoking Status Never  Smokeless Tobacco Never    Goals Met:  Independence with exercise equipment Exercise tolerated well No report of concerns or symptoms today Strength training completed today  Goals Unmet:  Not Applicable  Comments: checkout 1030   Dr. Carlyle Dolly is Medical Director for [place]

## 2021-11-01 ENCOUNTER — Encounter (HOSPITAL_COMMUNITY)
Admission: RE | Admit: 2021-11-01 | Discharge: 2021-11-01 | Disposition: A | Discharge status: Home / Self Care | Payer: 59 | Source: Ambulatory Visit | Attending: Cardiovascular Disease | Admitting: Cardiovascular Disease

## 2021-11-01 DIAGNOSIS — I214 Non-ST elevation (NSTEMI) myocardial infarction: Secondary | ICD-10-CM

## 2021-11-01 DIAGNOSIS — Z951 Presence of aortocoronary bypass graft: Secondary | ICD-10-CM | POA: Insufficient documentation

## 2021-11-01 NOTE — Progress Notes (Signed)
Cardiac Individual Treatment Plan  Patient Details  Name: Matthew Mann MRN: [id number] Date of Birth: 1966-06-07 Referring Provider:   Flowsheet Row CARDIAC REHAB PHASE II ORIENTATION from 09/07/2021 in [place]  Referring Provider Dr. Kipp Brood       Initial Encounter Date:  Flowsheet Row CARDIAC REHAB PHASE II ORIENTATION from 09/07/2021 in [place]  Date 09/07/21       Visit Diagnosis: NSTEMI (non-ST elevated myocardial infarction) [facility])  Patient's Home Medications on Admission:  Current Outpatient Medications:    acetaminophen (TYLENOL) 500 MG tablet, Take 2 tablets (1,000 mg total) by mouth every 6 (six) hours as needed., Disp: 30 tablet, Rfl: 0   amLODipine (NORVASC) 5 MG tablet, Take 1 tablet (5 mg total) by mouth daily., Disp: 90 tablet, Rfl: 1   aspirin 81 MG EC tablet, Take 1 tablet by mouth daily., Disp: , Rfl:    atorvastatin (LIPITOR) 80 MG tablet, Take 1 tablet (80 mg total) by mouth daily., Disp: 90 tablet, Rfl: 1   clopidogrel (PLAVIX) 75 MG tablet, Take 1 tablet (75 mg total) by mouth daily., Disp: 30 tablet, Rfl: 1   ezetimibe (ZETIA) 10 MG tablet, Take 1 tablet (10 mg total) by mouth daily., Disp: 90 tablet, Rfl: 3   FARXIGA 10 MG TABS tablet, TAKE 1 TABLET BY MOUTH EVERY DAY, Disp: 90 tablet, Rfl: 0   losartan (COZAAR) 100 MG tablet, TAKE 1 TABLET BY MOUTH EVERY DAY, Disp: 90 tablet, Rfl: 0   metFORMIN (GLUCOPHAGE-XR) 500 MG 24 hr tablet, TAKE 1 TABLET BY MOUTH TWICE A DAY (Patient taking differently: Take 1,000 mg by mouth daily with breakfast.), Disp: 180 tablet, Rfl: 0   methocarbamol (ROBAXIN) 500 MG tablet, Take 1 tablet (500 mg total) by mouth every 8 (eight) hours as needed for muscle spasms., Disp: 60 tablet, Rfl: 1   metoprolol tartrate (LOPRESSOR) 25 MG tablet, Take 1 tablet (25 mg total) by mouth 2 (two) times daily., Disp: 60 tablet, Rfl: 1   Multiple Vitamins-Minerals (MULTIVITAMIN WITH MINERALS) tablet,  Take 2 tablets by mouth daily., Disp: , Rfl:    OZEMPIC, 0.25 OR 0.5 MG/DOSE, 2 MG/1.5ML SOPN, INJECT 0.25MG  INTO THE SKIN ONE TIME PER WEEK, Disp: 1.5 mL, Rfl: 2   traZODone (DESYREL) 50 MG tablet, Take 1 tablet (50 mg total) by mouth at bedtime as needed. for sleep, Disp: 90 tablet, Rfl: 1  Past Medical History: Past Medical History:  Diagnosis Date   Diabetes mellitus without complication ([place])    Hyperlipidemia    Hypertension     Tobacco Use: Social History   Tobacco Use  Smoking Status Never  Smokeless Tobacco Never    Labs: Recent Review Flowsheet Data     Labs for ITP Cardiac and Pulmonary Rehab Latest Ref Rng & Units 06/19/2021 06/19/2021 06/19/2021 06/19/2021 07/25/2021   Cholestrol 100 - 199 mg/dL - - - - 132   LDLCALC 0 - 99 mg/dL - - - - 77   HDL >39 mg/dL - - - - 34(L)   Trlycerides 0 - 149 mg/dL - - - - 112   Hemoglobin A1c 4.8 - 5.6 % - - - - 7.0(H)   PHART 7.350 - 7.450 - 7.366 7.388 7.310(L) -   PCO2ART 32.0 - 48.0 mmHg - 39.4 32.0 30.1(L) -   HCO3 20.0 - 28.0 mmol/L - 22.7 19.4(L) 15.1(L) -   TCO2 22 - 32 mmol/L 22 24 20(L) 16(L) -   ACIDBASEDEF 0.0 - 2.0 mmol/L -  3.0(H) 5.0(H) 10.0(H) -   O2SAT % - 90.0 95.0 95.0 -       Capillary Blood Glucose: Lab Results  Component Value Date   GLUCAP 158 (H) 06/23/2021   GLUCAP 91 06/23/2021   GLUCAP 191 (H) 06/22/2021   GLUCAP 237 (H) 06/22/2021   GLUCAP 145 (H) 06/22/2021     Exercise Target Goals: Exercise Program Goal: Individual exercise prescription set using results from initial 6 min walk test and THRR while considering  patients activity barriers and safety.   Exercise Prescription Goal: Starting with aerobic activity 30 plus minutes a day, 3 days per week for initial exercise prescription. Provide home exercise prescription and guidelines that participant acknowledges understanding prior to discharge.  Activity Barriers & Risk Stratification:  Activity Barriers & Cardiac Risk Stratification  - 09/07/21 1249       Activity Barriers & Cardiac Risk Stratification   Activity Barriers None    Cardiac Risk Stratification High             6 Minute Walk:  6 Minute Walk     Row Name 09/07/21 1404         6 Minute Walk   Phase Initial     Distance 1300 feet     Walk Time 6 minutes     # of Rest Breaks 0     MPH 2.46     METS 3.93     RPE 11     VO2 Peak 13.77     Symptoms No     Resting HR 91 bpm     Resting BP 120/80     Resting Oxygen Saturation  98 %     Exercise Oxygen Saturation  during 6 min walk 99 %     Max Ex. HR 95 bpm     Max Ex. BP 136/72     2 Minute Post BP 118/78              Oxygen Initial Assessment:   Oxygen Re-Evaluation:   Oxygen Discharge (Final Oxygen Re-Evaluation):   Initial Exercise Prescription:  Initial Exercise Prescription - 09/07/21 1400       Date of Initial Exercise RX and Referring Provider   Date 09/07/21    Referring Provider Dr. Kipp Brood    Expected Discharge Date 12/01/21      Treadmill   MPH 2.4    Grade 0    Minutes 22      Recumbant Elliptical   Level 1    RPM 50    Minutes 17      Prescription Details   Frequency (times per week) 3    Duration Progress to 30 minutes of continuous aerobic without signs/symptoms of physical distress      Intensity   THRR 40-80% of Max Heartrate 67-134    Ratings of Perceived Exertion 11-13    Perceived Dyspnea 0-4      Resistance Training   Training Prescription Yes    Weight 4 lbs    Reps 10-15             Perform Capillary Blood Glucose checks as needed.  Exercise Prescription Changes:   Exercise Prescription Changes     Row Name 09/18/21 1200 09/29/21 1000 10/02/21 1200 10/16/21 1200 10/30/21 1400     Response to Exercise   Blood Pressure (Admit) 120/60 -- 120/82 104/76 92/70   Blood Pressure (Exercise) 136/72 -- 140/80 122/78 138/70   Blood Pressure (Exit) 112/76 -- 106/76 122/66  102/72   Heart Rate (Admit) 84 bpm -- 76 bpm 79 bpm 75  bpm   Heart Rate (Exercise) 104 bpm -- 104 bpm 105 bpm 102 bpm   Heart Rate (Exit) 93 bpm -- 85 bpm 88 bpm 83 bpm   Rating of Perceived Exertion (Exercise) 11 -- 12 12 11    Duration Continue with 30 min of aerobic exercise without signs/symptoms of physical distress. -- Continue with 30 min of aerobic exercise without signs/symptoms of physical distress. Continue with 30 min of aerobic exercise without signs/symptoms of physical distress. Continue with 30 min of aerobic exercise without signs/symptoms of physical distress.   Intensity THRR unchanged -- THRR unchanged THRR unchanged THRR unchanged     Progression   Progression Continue to progress workloads to maintain intensity without signs/symptoms of physical distress. -- Continue to progress workloads to maintain intensity without signs/symptoms of physical distress. Continue to progress workloads to maintain intensity without signs/symptoms of physical distress. Continue to progress workloads to maintain intensity without signs/symptoms of physical distress.     Resistance Training   Training Prescription Yes -- Yes Yes Yes   Weight 5 -- 4 5 5    Reps 10-15 -- 10-15 10-15 10-15   Time 10 Minutes -- 10 Minutes 10 Minutes 10 Minutes     Treadmill   MPH 2.6 -- 2.8 3 3    Grade 0 -- 0 0 1   Minutes 22 -- 22 22 22    METs 2.99 -- 3.14 3.3 3.71     Recumbant Elliptical   Level 2 -- 2 2 3    RPM 59 -- 66 73 69   Minutes 17 -- 17 17 17    METs 3.9 -- 4.4 5.6 4.4     Home Exercise Plan   Plans to continue exercise at -- Home (comment) -- -- --   Frequency -- Add 2 additional days to program exercise sessions. -- -- --   Initial Home Exercises Provided -- 09/29/21 -- -- --            Exercise Comments:   Exercise Comments     Row Name 09/29/21 1006           Exercise Comments home ecercise reviewed                Exercise Goals and Review:   Exercise Goals     Row Name 09/07/21 1408 10/02/21 1258 10/30/21 1421          Exercise Goals   Increase Physical Activity Yes Yes Yes     Intervention Provide advice, education, support and counseling about physical activity/exercise needs.;Develop an individualized exercise prescription for aerobic and resistive training based on initial evaluation findings, risk stratification, comorbidities and participant's personal goals. Provide advice, education, support and counseling about physical activity/exercise needs.;Develop an individualized exercise prescription for aerobic and resistive training based on initial evaluation findings, risk stratification, comorbidities and participant's personal goals. Provide advice, education, support and counseling about physical activity/exercise needs.;Develop an individualized exercise prescription for aerobic and resistive training based on initial evaluation findings, risk stratification, comorbidities and participant's personal goals.     Expected Outcomes Short Term: Attend rehab on a regular basis to increase amount of physical activity.;Long Term: Add in home exercise to make exercise part of routine and to increase amount of physical activity.;Long Term: Exercising regularly at least 3-5 days a week. Short Term: Attend rehab on a regular basis to increase amount of physical activity.;Long Term: Add in home exercise to  make exercise part of routine and to increase amount of physical activity.;Long Term: Exercising regularly at least 3-5 days a week. Short Term: Attend rehab on a regular basis to increase amount of physical activity.;Long Term: Add in home exercise to make exercise part of routine and to increase amount of physical activity.;Long Term: Exercising regularly at least 3-5 days a week.     Increase Strength and Stamina Yes Yes --     Intervention Provide advice, education, support and counseling about physical activity/exercise needs.;Develop an individualized exercise prescription for aerobic and resistive training based on  initial evaluation findings, risk stratification, comorbidities and participant's personal goals. Provide advice, education, support and counseling about physical activity/exercise needs.;Develop an individualized exercise prescription for aerobic and resistive training based on initial evaluation findings, risk stratification, comorbidities and participant's personal goals. Provide advice, education, support and counseling about physical activity/exercise needs.;Develop an individualized exercise prescription for aerobic and resistive training based on initial evaluation findings, risk stratification, comorbidities and participant's personal goals.     Expected Outcomes Short Term: Increase workloads from initial exercise prescription for resistance, speed, and METs.;Short Term: Perform resistance training exercises routinely during rehab and add in resistance training at home;Long Term: Improve cardiorespiratory fitness, muscular endurance and strength as measured by increased METs and functional capacity (6MWT) Short Term: Increase workloads from initial exercise prescription for resistance, speed, and METs.;Short Term: Perform resistance training exercises routinely during rehab and add in resistance training at home;Long Term: Improve cardiorespiratory fitness, muscular endurance and strength as measured by increased METs and functional capacity (6MWT) Short Term: Increase workloads from initial exercise prescription for resistance, speed, and METs.;Short Term: Perform resistance training exercises routinely during rehab and add in resistance training at home;Long Term: Improve cardiorespiratory fitness, muscular endurance and strength as measured by increased METs and functional capacity (6MWT)     Able to understand and use rate of perceived exertion (RPE) scale Yes Yes Yes     Intervention Provide education and explanation on how to use RPE scale Provide education and explanation on how to use RPE scale  Provide education and explanation on how to use RPE scale     Expected Outcomes Short Term: Able to use RPE daily in rehab to express subjective intensity level;Long Term:  Able to use RPE to guide intensity level when exercising independently Short Term: Able to use RPE daily in rehab to express subjective intensity level;Long Term:  Able to use RPE to guide intensity level when exercising independently Short Term: Able to use RPE daily in rehab to express subjective intensity level;Long Term:  Able to use RPE to guide intensity level when exercising independently     Knowledge and understanding of Target Heart Rate Range (THRR) Yes Yes Yes     Intervention Provide education and explanation of THRR including how the numbers were predicted and where they are located for reference Provide education and explanation of THRR including how the numbers were predicted and where they are located for reference Provide education and explanation of THRR including how the numbers were predicted and where they are located for reference     Expected Outcomes Short Term: Able to state/look up THRR;Long Term: Able to use THRR to govern intensity when exercising independently;Short Term: Able to use daily as guideline for intensity in rehab Short Term: Able to state/look up THRR;Long Term: Able to use THRR to govern intensity when exercising independently;Short Term: Able to use daily as guideline for intensity in rehab Short Term: Able to  state/look up THRR;Long Term: Able to use THRR to govern intensity when exercising independently;Short Term: Able to use daily as guideline for intensity in rehab     Able to check pulse independently Yes Yes Yes     Intervention Provide education and demonstration on how to check pulse in carotid and radial arteries.;Review the importance of being able to check your own pulse for safety during independent exercise Provide education and demonstration on how to check pulse in carotid and  radial arteries.;Review the importance of being able to check your own pulse for safety during independent exercise Provide education and demonstration on how to check pulse in carotid and radial arteries.;Review the importance of being able to check your own pulse for safety during independent exercise     Expected Outcomes Short Term: Able to explain why pulse checking is important during independent exercise;Long Term: Able to check pulse independently and accurately Short Term: Able to explain why pulse checking is important during independent exercise;Long Term: Able to check pulse independently and accurately Short Term: Able to explain why pulse checking is important during independent exercise;Long Term: Able to check pulse independently and accurately     Understanding of Exercise Prescription Yes Yes Yes     Intervention Provide education, explanation, and written materials on patient's individual exercise prescription Provide education, explanation, and written materials on patient's individual exercise prescription Provide education, explanation, and written materials on patient's individual exercise prescription     Expected Outcomes Short Term: Able to explain program exercise prescription;Long Term: Able to explain home exercise prescription to exercise independently Short Term: Able to explain program exercise prescription;Long Term: Able to explain home exercise prescription to exercise independently Short Term: Able to explain program exercise prescription;Long Term: Able to explain home exercise prescription to exercise independently              Exercise Goals Re-Evaluation :  Exercise Goals Re-Evaluation     [place] Name 10/02/21 1258 10/30/21 1422           Exercise Goal Re-Evaluation   Exercise Goals Review Increase Physical Activity;Increase Strength and Stamina;Able to understand and use rate of perceived exertion (RPE) scale;Knowledge and understanding of Target Heart Rate  Range (THRR);Able to check pulse independently;Understanding of Exercise Prescription Increase Physical Activity;Increase Strength and Stamina;Able to understand and use rate of perceived exertion (RPE) scale;Knowledge and understanding of Target Heart Rate Range (THRR);Able to check pulse independently;Understanding of Exercise Prescription      Comments Pt has completed 10 sessions of cardiac rehab. He has been able to progress his workloads and is eager to improve his fitness. He is currently exercising at 4.4 METs. Will continue to monitor and progress as able. Pt has completed 20 sessions of cardiac rehab. He continues to increase his workload and is eager to improve. He is currently exercising at home walking. He is currently exercising at 4.4 METs on the ellp. Will continue to monitor and progress as able.      Expected Outcomes Through exercise at home and at rehab, the patient will meet their stated goals. Through exercise at home and at rehab, the patient will meet their stated goals.                Discharge Exercise Prescription (Final Exercise Prescription Changes):  Exercise Prescription Changes - 10/30/21 1400       Response to Exercise   Blood Pressure (Admit) 92/70    Blood Pressure (Exercise) 138/70    Blood Pressure (Exit)  102/72    Heart Rate (Admit) 75 bpm    Heart Rate (Exercise) 102 bpm    Heart Rate (Exit) 83 bpm    Rating of Perceived Exertion (Exercise) 11    Duration Continue with 30 min of aerobic exercise without signs/symptoms of physical distress.    Intensity THRR unchanged      Progression   Progression Continue to progress workloads to maintain intensity without signs/symptoms of physical distress.      Resistance Training   Training Prescription Yes    Weight 5    Reps 10-15    Time 10 Minutes      Treadmill   MPH 3    Grade 1    Minutes 22    METs 3.71      Recumbant Elliptical   Level 3    RPM 69    Minutes 17    METs 4.4              Nutrition:  Target Goals: Understanding of nutrition guidelines, daily intake of sodium 1500mg , cholesterol 200mg , calories 30% from fat and 7% or less from saturated fats, daily to have 5 or more servings of fruits and vegetables.  Biometrics:  Pre Biometrics - 09/07/21 1408       Pre Biometrics   Height 5\' 7"  (1.702 m)    Weight 73.7 kg    Waist Circumference 35 inches    Hip Circumference 37 inches    Waist to Hip Ratio 0.95 %    BMI (Calculated) 25.44    Triceps Skinfold 9 mm    % Body Fat 21.9 %    Grip Strength 47.7 kg    Flexibility 7.75 in    Single Leg Stand 10.16 seconds              Nutrition Therapy Plan and Nutrition Goals:  Nutrition Therapy & Goals - 09/07/21 1332       Personal Nutrition Goals   Additional Goals? No    Comments Patient scored 25 on his diet assessment. Handout provided and explained regarding healthier choices and DM informataion. Patient verbalized understanding. We offer 2 educational sessions on heart healthy nutrition with handouts and assistance with RD referral if patient is interested. He says he and his wife.are following a dash heart healthy diet.      Intervention Plan   Intervention Nutrition handout(s) given to patient.    Expected Outcomes Short Term Goal: Understand basic principles of dietary content, such as calories, fat, sodium, cholesterol and nutrients.             Nutrition Assessments:  Nutrition Assessments - 09/07/21 1332       MEDFICTS Scores   Pre Score 25            MEDIFICTS Score Key: ?70 Need to make dietary changes  40-70 Heart Healthy Diet ? 40 Therapeutic Level Cholesterol Diet   Picture Your Plate Scores: [id number] Unhealthy dietary pattern with much room for improvement. 41-50 Dietary pattern unlikely to meet recommendations for good health and room for improvement. 51-60 More healthful dietary pattern, with some room for improvement.  >60 Healthy dietary pattern, although there  may be some specific behaviors that could be improved.    Nutrition Goals Re-Evaluation:   Nutrition Goals Discharge (Final Nutrition Goals Re-Evaluation):   Psychosocial: Target Goals: Acknowledge presence or absence of significant depression and/or stress, maximize coping skills, provide positive support system. Participant is able to verbalize types and ability to use  techniques and skills needed for reducing stress and depression.  Initial Review & Psychosocial Screening:  Initial Psych Review & Screening - 09/07/21 1413       Initial Review   Current issues with None Identified      Family Dynamics   Good Support System? Yes      Barriers   Psychosocial barriers to participate in program There are no identifiable barriers or psychosocial needs.      Screening Interventions   Interventions Encouraged to exercise    Expected Outcomes Short Term goal: Identification and review with participant of any Quality of Life or Depression concerns found by scoring the questionnaire.             Quality of Life Scores:  Quality of Life - 09/07/21 1404       Quality of Life   Select Quality of Life      Quality of Life Scores   Health/Function Pre 25.87 %    Socioeconomic Pre 27.5 %    Psych/Spiritual Pre 28.57 %    Family Pre 28.3 %    GLOBAL Pre 27.12 %            Scores of 19 and below usually indicate a poorer quality of life in these areas.  A difference of  2-3 points is a clinically meaningful difference.  A difference of 2-3 points in the total score of the Quality of Life Index has been associated with significant improvement in overall quality of life, self-image, physical symptoms, and general health in studies assessing change in quality of life.  PHQ-9: Recent Review Flowsheet Data     Depression screen [facility] 2/9 09/08/2021 09/07/2021 07/25/2021 04/27/2021 02/21/2021   Decreased Interest 0 0 0 0 0   Down, Depressed, Hopeless 0 0 - 0 0   PHQ - 2 Score 0 0 0 0  0   Altered sleeping 1 0 - 0 0   Tired, decreased energy 0 1 - 0 0   Change in appetite 0 0 - 0 0   Feeling bad or failure about yourself  0 0 - 0 0   Trouble concentrating 0 0 - 0 0   Moving slowly or fidgety/restless 0 0 - 0 0   Suicidal thoughts 0 0 - 0 0   PHQ-9 Score 1 1 - 0 0   Difficult doing work/chores Not difficult at all Not difficult at all - Not difficult at all Not difficult at all      Interpretation of Total Score  Total Score Depression Severity:  1-4 = Minimal depression, 5-9 = Mild depression, 10-14 = Moderate depression, 15-19 = Moderately severe depression, 20-27 = Severe depression   Psychosocial Evaluation and Intervention:  Psychosocial Evaluation - 09/07/21 1414       Psychosocial Evaluation & Interventions   Interventions Stress management education;Relaxation education;Encouraged to exercise with the program and follow exercise prescription    Comments Patient has no psychosocial barriers or needs identified at his orientation visit. His PHQ-9 score was 1. He denies any depression, anxiety, or stress concerns. 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 He says he only took 1 or 2 times. He is currently sleeping well without medication. 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 He lives with his wife of many years. They have 4 children  and 3 grandchildren. All of his children live out of town but he says he is very close with all of them. He names his wife as his main support and says he also has several pastor friends that are very supportative. He is looking forward to doing the program hoping to get back to his normal activities and gain his strength back.    Expected Outcomes Patient will continue to have no psychosocial barriers or issues identified.    Continue Psychosocial Services  No Follow up required              Psychosocial Re-Evaluation:  Psychosocial Re-Evaluation     [place] Name 09/25/21 1432 10/25/21 0857           Psychosocial Re-Evaluation   Current issues with None Identified --      Comments Patient is new to the program completing 6 sessions. He continues to have no psychosocial barriers or issues identified. He seems to enjoy coming to the program and demonstrates an interest in improving his health. We will continue to monitor. Patient is new to the program completing 16 sessions. He continues to have no psychosocial barriers or issues identified. He continues to enjoy coming to the program and demonstrates an interest in improving his health. We will continue to monitor.      Expected Outcomes Patient will continue to have no psychosocial barriers or issues identified. Patient will continue to have no psychosocial barriers or issues identified.      Interventions Stress management education;Encouraged to attend Cardiac Rehabilitation for the exercise;Relaxation education Stress management education;Encouraged to attend Cardiac Rehabilitation for the exercise;Relaxation education      Continue Psychosocial Services  No Follow up required No Follow up required               Psychosocial Discharge (Final Psychosocial Re-Evaluation):  Psychosocial Re-Evaluation - 10/25/21 0857       Psychosocial Re-Evaluation   Comments Patient is new to the program completing 16 sessions. He continues to have no psychosocial barriers or issues identified. He continues to enjoy coming to the program and demonstrates an interest in improving his health. We will continue to monitor.    Expected Outcomes Patient will continue to have no psychosocial barriers or issues identified.    Interventions Stress management education;Encouraged to attend Cardiac Rehabilitation for the exercise;Relaxation education    Continue Psychosocial Services  No Follow up required             Vocational  Rehabilitation: Provide vocational rehab assistance to qualifying candidates.   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             Education: Education Goals: Education classes will be provided on a weekly basis, covering required topics. Participant will state understanding/return demonstration of topics presented.  Learning Barriers/Preferences:  Learning Barriers/Preferences - 09/07/21 1410       Learning Barriers/Preferences   Learning Barriers None    Learning Preferences Audio;Written Material;Skilled Demonstration             Education Topics: Hypertension, Hypertension Reduction -Define heart disease and high blood pressure. Discus how high blood pressure affects the body and ways to reduce high blood pressure. [place]  REHAB PHASE II EXERCISE from 10/25/2021 in [place]  Date 09/13/21  Educator DF  Instruction Review Code 2- Demonstrated Understanding       Exercise and Your Heart -Discuss why it is important to exercise, the FITT principles of exercise, normal and abnormal responses to exercise, and how to exercise safely.   Angina -Discuss definition of angina, causes of angina, treatment of angina, and how to decrease risk of having angina. Flowsheet Row CARDIAC REHAB PHASE II EXERCISE from 10/25/2021 in [place]  Date 09/27/21  Educator pb  Instruction Review Code 1- Verbalizes Understanding       Cardiac Medications -Review what the following cardiac medications are used for, how they affect the body, and side effects that may occur when taking the medications.  Medications include Aspirin, Beta blockers, calcium channel blockers,  ACE Inhibitors, angiotensin receptor blockers, diuretics, digoxin, and antihyperlipidemics.   Congestive Heart Failure -Discuss the definition of CHF, how to live with CHF, the signs and symptoms of CHF, and how keep track of weight and sodium intake. Flowsheet Row CARDIAC REHAB PHASE II EXERCISE from 10/25/2021 in [place]  Date 10/04/21  Educator Hj  Instruction Review Code 2- Demonstrated Understanding       Heart Disease and Intimacy -Discus the effect sexual activity has on the heart, how changes occur during intimacy as we age, and safety during sexual activity. Flowsheet Row CARDIAC REHAB PHASE II EXERCISE from 10/25/2021 in [place]  Date 10/11/21  Educator hj  Instruction Review Code 2- Demonstrated Understanding       Smoking Cessation / COPD -Discuss different methods to quit smoking, the health benefits of quitting smoking, and the definition of COPD.   Nutrition I: Fats -Discuss the types of cholesterol, what cholesterol does to the heart, and how cholesterol levels can be controlled. Flowsheet Row CARDIAC REHAB PHASE II EXERCISE from 10/25/2021 in [place]  Date 10/25/21  Educator [place]  Instruction Review Code 1- Verbalizes Understanding       Nutrition II: Labels -Discuss the different components of food labels and how to read food label   Heart Parts/Heart Disease and PAD -Discuss the anatomy of the heart, the pathway of blood circulation through the heart, and these are affected by heart disease.   Stress I: Signs and Symptoms -Discuss the causes of stress, how stress may lead to anxiety and depression, and ways to limit stress.   Stress II: Relaxation -Discuss different types of relaxation techniques to limit stress.   Warning Signs of Stroke / TIA -Discuss definition of a stroke, what the signs and symptoms are of a stroke, and how to identify when someone is having  stroke.   Knowledge Questionnaire Score:  Knowledge Questionnaire Score - 09/07/21 1336       Knowledge Questionnaire Score   Pre Score 20/24             Core Components/Risk Factors/Patient Goals at Admission:  Personal Goals and Risk Factors at Admission - 09/07/21 1410       Core Components/Risk Factors/Patient Goals on Admission    Weight Management Weight Maintenance    Diabetes Yes    Intervention Provide education about signs/symptoms and action to take for hypo/hyperglycemia.;Provide education about proper nutrition, including hydration, and aerobic/resistive exercise prescription along with prescribed medications to achieve blood glucose in normal ranges: Fasting glucose 65-99 mg/dL    Expected Outcomes Short Term: Participant verbalizes understanding of the  signs/symptoms and immediate care of hyper/hypoglycemia, proper foot care and importance of medication, aerobic/resistive exercise and nutrition plan for blood glucose control.;Long Term: Attainment of HbA1C < 7%.    Lipids Yes    Intervention Provide education and support for participant on nutrition & aerobic/resistive exercise along with prescribed medications to achieve LDL 70mg , HDL >40mg .    Expected Outcomes Short Term: Participant states understanding of desired cholesterol values and is compliant with medications prescribed. Participant is following exercise prescription and nutrition guidelines.;Long Term: Cholesterol controlled with medications as prescribed, with individualized exercise RX and with personalized nutrition plan. Value goals: LDL < 70mg , HDL > 40 mg.    Personal Goal Other Yes    Personal Goal Patient wants to get stronger; recovy from his surgery and get back to his ADL's and working full-time as Theme park manager.    Intervention Patient will attend the CR program with exercise and education and supplement with exercise at home.    Expected Outcomes Patient will complete the program meeting both personal  and program goals.             Core Components/Risk Factors/Patient Goals Review:   Goals and Risk Factor Review     Row Name 09/25/21 1427 10/25/21 0857           Core Components/Risk Factors/Patient Goals Review   Personal Goals Review Weight Management/Obesity;Diabetes;Lipids;Other Weight Management/Obesity;Diabetes;Lipids;Other      Review Patient was referred to CR with NSTEMI. He has multiple risk factors for CAD and is participating in the program for risk modification. He has completed 6 sessions with his current weight being 162.7 lbs. His intial weight was 162.0 lbs. His blood pressure is at goal. He saw his pcp 09/08/21. She increased Ozempic to 0.5 mg for tighter DM control with a goal of A1C <7%. Last was at 7.0% in November which has trended down form 11.7% 9 months ago. His personal goals for the program are to get stronger; recover from surgery and get back to his ADL's. We will continue to monitor his progress as he works towards meeting these goals. Patient has completed 16 sessions with his current weight being 163.3 lbs [place] 1 lb from last 30 day review. He continues to do well in the program with consistent attendance and progressions. His blood pressure is well controlled. His personal goals for the program continue to be to get stronger; recover from surgery and get back to his ADL's. We will continue to monitor his progress as he works towards meeting these goals.      Expected Outcomes Patient will complete the program meeting both personal and program goals. Patient will complete the program meeting both personal and program goals.               Core Components/Risk Factors/Patient Goals at Discharge (Final Review):   Goals and Risk Factor Review - 10/25/21 0857       Core Components/Risk Factors/Patient Goals Review   Personal Goals Review Weight Management/Obesity;Diabetes;Lipids;Other    Review Patient has completed 16 sessions with his current weight being  163.3 lbs [place] 1 lb from last 30 day review. He continues to do well in the program with consistent attendance and progressions. His blood pressure is well controlled. His personal goals for the program continue to be to get stronger; recover from surgery and get back to his ADL's. We will continue to monitor his progress as he works towards meeting these goals.    Expected Outcomes Patient will complete  the program meeting both personal and program goals.             ITP Comments:   Comments: ITP REVIEW Pt is making expected progress toward Cardiac Rehab goals after completing 20 sessions. Recommend continued exercise, life style modification, education, and increased stamina and strength.

## 2021-11-01 NOTE — Progress Notes (Addendum)
Daily Session Note ? ?Patient Details  ?Name: Matthew Mann ?MRN: [id number] ?Date of Birth: February 14, 1966 ?Referring Provider:   ?Flowsheet Row CARDIAC REHAB PHASE II ORIENTATION from 09/07/2021 in [place]  ?Referring Provider Dr. Kipp Brood  ? ?  ? ? ?Encounter Date: 11/01/2021 ? ?Check In: ? Session Check In - 11/01/21 0930   ? ?  ? Check-In  ? Supervising physician immediately available to respond to emergencies [facility] MD immediately available   ? Physician(s) Dr. Harl Bowie   ? Location [facility]   ? Staff Present Hoy Register, MS, ACSM-CEP, Exercise Physiologist;Shalita Notte Wynetta Emery, RN, BSN;Heather Otho Ket, BS, Exercise Physiologist   ? Virtual Visit No   ? Medication changes reported     No   ? Fall or balance concerns reported    No   ? Tobacco Cessation No Change   ? Warm-up and Cool-down Performed as group-led instruction   ? VAD Patient? No   ? PAD/SET Patient? No   ?  ? Pain Assessment  ? Currently in Pain? No/denies   ? Pain Score 0-No pain   ? Multiple Pain Sites No   ? ?  ?  ? ?  ? ? ?Capillary Blood Glucose: ?No results found for this or any previous visit (from the past 24 hour(s)). ? ? ? ?Social History  ? ?Tobacco Use  ?Smoking Status Never  ?Smokeless Tobacco Never  ? ? ?Goals Met:  ?Independence with exercise equipment ?Exercise tolerated well ?No report of concerns or symptoms today ?Strength training completed today ? ?Goals Unmet:  ?Not Applicable ? ?Comments: Check out 930. ? ? ?Dr. Carlyle Dolly is Medical Director for [place] ?

## 2021-11-03 ENCOUNTER — Encounter (HOSPITAL_COMMUNITY)
Admission: RE | Admit: 2021-11-03 | Discharge: 2021-11-03 | Disposition: A | Discharge status: Home / Self Care | Payer: 59 | Source: Ambulatory Visit | Attending: Cardiovascular Disease | Admitting: Cardiovascular Disease

## 2021-11-03 DIAGNOSIS — Z951 Presence of aortocoronary bypass graft: Secondary | ICD-10-CM

## 2021-11-03 DIAGNOSIS — I214 Non-ST elevation (NSTEMI) myocardial infarction: Secondary | ICD-10-CM

## 2021-11-03 NOTE — Progress Notes (Signed)
Daily Session Note ? ?Patient Details  ?Name: Matthew Mann ?MRN: [id number] ?Date of Birth: [birth year] ?Referring Provider:   ?Flowsheet Row CARDIAC REHAB PHASE II ORIENTATION from 09/07/2021 in [place]  ?Referring Provider Dr. Kipp Brood  ? ?  ? ? ?Encounter Date: 11/03/2021 ? ?Check In: ? Session Check In - 11/03/21 5537   ? ?  ? Check-In  ? Supervising physician immediately available to respond to emergencies [facility] MD immediately available   ? Physician(s) Dr. Harl Bowie   ? Location [facility]   ? Staff Present Hoy Register, MS, ACSM-CEP, Exercise Physiologist;Laurin Morgenstern Wynetta Emery, RN, BSN;Phyllis Billingsley, RN   ? Virtual Visit No   ? Medication changes reported     No   ? Fall or balance concerns reported    No   ? Tobacco Cessation No Change   ? Warm-up and Cool-down Performed as group-led instruction   ? Resistance Training Performed Yes   ? VAD Patient? No   ? PAD/SET Patient? No   ?  ? Pain Assessment  ? Currently in Pain? No/denies   ? Pain Score 0-No pain   ? Multiple Pain Sites No   ? ?  ?  ? ?  ? ? ?Capillary Blood Glucose: ?No results found for this or any previous visit (from the past 24 hour(s)). ? ? ? ?Social History  ? ?Tobacco Use  ?Smoking Status Never  ?Smokeless Tobacco Never  ? ? ?Goals Met:  ?Independence with exercise equipment ?Exercise tolerated well ?No report of concerns or symptoms today ?Strength training completed today ? ?Goals Unmet:  ?Not Applicable ? ?Comments: Check out 1030. ? ? ?Dr. Carlyle Dolly is Medical Director for [place] ?

## 2021-11-06 ENCOUNTER — Encounter (HOSPITAL_COMMUNITY)
Admission: RE | Admit: 2021-11-06 | Discharge: 2021-11-06 | Disposition: A | Discharge status: Home / Self Care | Payer: 59 | Source: Ambulatory Visit | Attending: Cardiovascular Disease | Admitting: Cardiovascular Disease

## 2021-11-06 DIAGNOSIS — Z951 Presence of aortocoronary bypass graft: Secondary | ICD-10-CM

## 2021-11-06 DIAGNOSIS — I214 Non-ST elevation (NSTEMI) myocardial infarction: Secondary | ICD-10-CM | POA: Diagnosis not present

## 2021-11-06 NOTE — Progress Notes (Signed)
Daily Session Note ? ?Patient Details  ?Name: Matthew Mann ?MRN: [id number] ?Date of Birth: [birth year] ?Referring Provider:   ?Flowsheet Row CARDIAC REHAB PHASE II ORIENTATION from 09/07/2021 in [place]  ?Referring Provider Dr. Kipp Brood  ? ?  ? ? ?Encounter Date: 11/06/2021 ? ?Check In: ? Session Check In - 11/06/21 0928   ? ?  ? Check-In  ? Supervising physician immediately available to respond to emergencies [facility] MD immediately available   ? Physician(s) Dr Johnsie Cancel   ? Location [facility]   ? Staff Present Jilda Roche, RN, Bjorn Loser, MS, ACSM-CEP, Exercise Physiologist;Heather Zigmund Jemuel Laursen, Exercise Physiologist   ? Virtual Visit No   ? Medication changes reported     No   ? Fall or balance concerns reported    No   ? Tobacco Cessation No Change   ? Warm-up and Cool-down Performed as group-led instruction   ? Resistance Training Performed Yes   ? VAD Patient? No   ?  ? Pain Assessment  ? Currently in Pain? No/denies   ? Pain Score 0-No pain   ? ?  ?  ? ?  ? ? ?Capillary Blood Glucose: ?No results found for this or any previous visit (from the past 24 hour(s)). ? ? ? ?Social History  ? ?Tobacco Use  ?Smoking Status Never  ?Smokeless Tobacco Never  ? ? ?Goals Met:  ?Independence with exercise equipment ?Exercise tolerated well ?No report of concerns or symptoms today ? ?Goals Unmet:  ?Not Applicable ? ?Comments: checkout 1030 ? ? ?Dr. Carlyle Dolly is Medical Director for [place] ?

## 2021-11-07 ENCOUNTER — Encounter: Payer: 59 | Admitting: Family Medicine

## 2021-11-08 ENCOUNTER — Encounter (HOSPITAL_COMMUNITY)
Admission: RE | Admit: 2021-11-08 | Discharge: 2021-11-08 | Disposition: A | Discharge status: Home / Self Care | Payer: 59 | Source: Ambulatory Visit | Attending: Cardiovascular Disease | Admitting: Cardiovascular Disease

## 2021-11-08 DIAGNOSIS — I214 Non-ST elevation (NSTEMI) myocardial infarction: Secondary | ICD-10-CM | POA: Diagnosis not present

## 2021-11-08 DIAGNOSIS — Z951 Presence of aortocoronary bypass graft: Secondary | ICD-10-CM

## 2021-11-08 NOTE — Progress Notes (Signed)
Daily Session Note ? ?Patient Details  ?Name: Sanav Remer ?MRN: [id number] ?Date of Birth: [birth year] ?Referring Provider:   ?Flowsheet Row CARDIAC REHAB PHASE II ORIENTATION from 09/07/2021 in [place]  ?Referring Provider Dr. Kipp Brood  ? ?  ? ? ?Encounter Date: 11/08/2021 ? ?Check In: ? Session Check In - 11/08/21 0930   ? ?  ? Check-In  ? Supervising physician immediately available to respond to emergencies [facility] MD immediately available   ? Physician(s) Dr. Domenic Polite   ? Location [facility]   ? Staff Present Geanie Cooley, RN;Dalton Kris Mouton, MS, ACSM-CEP, Exercise Physiologist;Heather Zigmund Daniel, Exercise Physiologist   ? Virtual Visit No   ? Medication changes reported     No   ? Fall or balance concerns reported    No   ? Tobacco Cessation No Change   ? Warm-up and Cool-down Performed as group-led instruction   ? Resistance Training Performed Yes   ? VAD Patient? No   ? PAD/SET Patient? No   ?  ? Pain Assessment  ? Currently in Pain? No/denies   ? Pain Score 0-No pain   ? Multiple Pain Sites No   ? ?  ?  ? ?  ? ? ?Capillary Blood Glucose: ?No results found for this or any previous visit (from the past 24 hour(s)). ? ? ? ?Social History  ? ?Tobacco Use  ?Smoking Status Never  ?Smokeless Tobacco Never  ? ? ?Goals Met:  ?Independence with exercise equipment ?Exercise tolerated well ?No report of concerns or symptoms today ?Strength training completed today ? ?Goals Unmet:  ?Not Applicable ? ?Comments: check out @ 10:30 ? ? ?Dr. Carlyle Dolly is Medical Director for [place] ?

## 2021-11-10 ENCOUNTER — Encounter (HOSPITAL_COMMUNITY)
Admission: RE | Admit: 2021-11-10 | Discharge: 2021-11-10 | Disposition: A | Discharge status: Home / Self Care | Payer: 59 | Source: Ambulatory Visit | Attending: Cardiovascular Disease | Admitting: Cardiovascular Disease

## 2021-11-10 DIAGNOSIS — I214 Non-ST elevation (NSTEMI) myocardial infarction: Secondary | ICD-10-CM | POA: Diagnosis not present

## 2021-11-10 DIAGNOSIS — Z951 Presence of aortocoronary bypass graft: Secondary | ICD-10-CM

## 2021-11-10 NOTE — Progress Notes (Signed)
Daily Session Note ? ?Patient Details  ?Name: Matthew Mann ?MRN: [id number] ?Date of Birth: 12-31-1965 ?Referring Provider:   ?Flowsheet Row CARDIAC REHAB PHASE II ORIENTATION from 09/07/2021 in [place]  ?Referring Provider Dr. Kipp Brood  ? ?  ? ? ?Encounter Date: 11/10/2021 ? ?Check In: ? Session Check In - 11/10/21 0925   ? ?  ? Check-In  ? Supervising physician immediately available to respond to emergencies [facility] MD immediately available   ? Physician(s) Dr. Domenic Polite   ? Location [facility]   ? Staff Present Redge Gainer, BS, Exercise Physiologist;Rooney Swails Kris Mouton, MS, ACSM-CEP, Exercise Physiologist;Debra Wynetta Emery, RN, BSN   ? Virtual Visit No   ? Medication changes reported     No   ? Fall or balance concerns reported    No   ? Tobacco Cessation No Change   ? Warm-up and Cool-down Performed as group-led instruction   ? Resistance Training Performed Yes   ? VAD Patient? No   ? PAD/SET Patient? No   ?  ? Pain Assessment  ? Currently in Pain? No/denies   ? Pain Score 0-No pain   ? Multiple Pain Sites No   ? ?  ?  ? ?  ? ? ?Capillary Blood Glucose: ?No results found for this or any previous visit (from the past 24 hour(s)). ? ? ? ?Social History  ? ?Tobacco Use  ?Smoking Status Never  ?Smokeless Tobacco Never  ? ? ?Goals Met:  ?Independence with exercise equipment ?Exercise tolerated well ?No report of concerns or symptoms today ?Strength training completed today ? ?Goals Unmet:  ?Not Applicable ? ?Comments: checkout time is 1030 ? ? ?Dr. Carlyle Dolly is Medical Director for [place] ?

## 2021-11-13 ENCOUNTER — Encounter (HOSPITAL_COMMUNITY)
Admission: RE | Admit: 2021-11-13 | Discharge: 2021-11-13 | Disposition: A | Discharge status: Home / Self Care | Payer: 59 | Source: Ambulatory Visit | Attending: Cardiovascular Disease | Admitting: Cardiovascular Disease

## 2021-11-13 ENCOUNTER — Other Ambulatory Visit: Payer: Self-pay | Admitting: Family Medicine

## 2021-11-13 VITALS — Wt 163.1 lb

## 2021-11-13 DIAGNOSIS — Z951 Presence of aortocoronary bypass graft: Secondary | ICD-10-CM

## 2021-11-13 DIAGNOSIS — E1141 Type 2 diabetes mellitus with diabetic mononeuropathy: Secondary | ICD-10-CM

## 2021-11-13 DIAGNOSIS — I214 Non-ST elevation (NSTEMI) myocardial infarction: Secondary | ICD-10-CM

## 2021-11-13 NOTE — Progress Notes (Signed)
Daily Session Note ? ?Patient Details  ?Name: Matthew Mann ?MRN: [id number] ?Date of Birth: [birth year] ?Referring Provider:   ?Flowsheet Row CARDIAC REHAB PHASE II ORIENTATION from 09/07/2021 in [place]  ?Referring Provider Dr. Kipp Brood  ? ?  ? ? ?Encounter Date: 11/13/2021 ? ?Check In: ? Session Check In - 11/13/21 0930   ? ?  ? Check-In  ? Supervising physician immediately available to respond to emergencies [facility] MD immediately available   ? Physician(s) Dr. Johney Frame   ? Location [facility]   ? Staff Present Hoy Register, MS, ACSM-CEP, Exercise Physiologist;Zahari Fazzino, RN   ? Virtual Visit No   ? Medication changes reported     No   ? Fall or balance concerns reported    No   ? Tobacco Cessation No Change   ? Warm-up and Cool-down Performed as group-led instruction   ? Resistance Training Performed Yes   ? VAD Patient? No   ? PAD/SET Patient? No   ?  ? Pain Assessment  ? Currently in Pain? No/denies   ? Pain Score 0-No pain   ? Multiple Pain Sites No   ? ?  ?  ? ?  ? ? ?Capillary Blood Glucose: ?No results found for this or any previous visit (from the past 24 hour(s)). ? ? ? ?Social History  ? ?Tobacco Use  ?Smoking Status Never  ?Smokeless Tobacco Never  ? ? ?Goals Met:  ?Independence with exercise equipment ?Exercise tolerated well ?No report of concerns or symptoms today ?Strength training completed today ? ?Goals Unmet:  ?Not Applicable ? ?Comments: check out @ 10:30am ? ? ?Dr. Carlyle Dolly is Medical Director for [place] ?

## 2021-11-14 ENCOUNTER — Other Ambulatory Visit: Payer: Self-pay | Admitting: Cardiovascular Disease

## 2021-11-15 ENCOUNTER — Encounter (HOSPITAL_COMMUNITY): Payer: 59

## 2021-11-17 ENCOUNTER — Encounter (HOSPITAL_COMMUNITY)
Admission: RE | Admit: 2021-11-17 | Discharge: 2021-11-17 | Disposition: A | Discharge status: Home / Self Care | Payer: 59 | Source: Ambulatory Visit | Attending: Cardiovascular Disease | Admitting: Cardiovascular Disease

## 2021-11-17 DIAGNOSIS — I214 Non-ST elevation (NSTEMI) myocardial infarction: Secondary | ICD-10-CM | POA: Diagnosis not present

## 2021-11-17 DIAGNOSIS — Z951 Presence of aortocoronary bypass graft: Secondary | ICD-10-CM

## 2021-11-17 NOTE — Progress Notes (Signed)
Daily Session Note ? ?Patient Details  ?Name: Matthew Mann ?MRN: [id number] ?Date of Birth: [birth year] ?Referring Provider:   ?Flowsheet Row CARDIAC REHAB PHASE II ORIENTATION from 09/07/2021 in [place]  ?Referring Provider Dr. Kipp Brood  ? ?  ? ? ?Encounter Date: 11/17/2021 ? ?Check In: ? Session Check In - 11/17/21 1749   ? ?  ? Check-In  ? Supervising physician immediately available to respond to emergencies [facility] MD immediately available   ? Physician(s) Dr Harl Bowie   ? Location [facility]   ? Staff Present Geanie Cooley, RN;Debra Wynetta Emery, RN, Madlyn Frankel, RN, Joanette Gula, RN, BSN   ? Virtual Visit No   ? Medication changes reported     No   ? Fall or balance concerns reported    No   ? Tobacco Cessation No Change   ? Warm-up and Cool-down Performed as group-led instruction   ? Resistance Training Performed Yes   ? VAD Patient? No   ? PAD/SET Patient? No   ?  ? Pain Assessment  ? Currently in Pain? No/denies   ? Pain Score 0-No pain   ? ?  ?  ? ?  ? ? ?Capillary Blood Glucose: ?No results found for this or any previous visit (from the past 24 hour(s)). ? ? ? ?Social History  ? ?Tobacco Use  ?Smoking Status Never  ?Smokeless Tobacco Never  ? ? ?Goals Met:  ?Independence with exercise equipment ?Exercise tolerated well ?No report of concerns or symptoms today ?Strength training completed today ? ?Goals Unmet:  ?Not Applicable ? ?Comments: checkout 1030 ? ? ? ?Dr. Carlyle Dolly is Medical Director for [place] ?

## 2021-11-19 ENCOUNTER — Other Ambulatory Visit: Payer: Self-pay | Admitting: Family Medicine

## 2021-11-19 DIAGNOSIS — E782 Mixed hyperlipidemia: Secondary | ICD-10-CM

## 2021-11-19 DIAGNOSIS — I10 Essential (primary) hypertension: Secondary | ICD-10-CM

## 2021-11-20 ENCOUNTER — Encounter (HOSPITAL_COMMUNITY)
Admission: RE | Admit: 2021-11-20 | Discharge: 2021-11-20 | Disposition: A | Discharge status: Home / Self Care | Payer: 59 | Source: Ambulatory Visit | Attending: Cardiovascular Disease | Admitting: Cardiovascular Disease

## 2021-11-20 DIAGNOSIS — Z951 Presence of aortocoronary bypass graft: Secondary | ICD-10-CM

## 2021-11-20 DIAGNOSIS — I214 Non-ST elevation (NSTEMI) myocardial infarction: Secondary | ICD-10-CM

## 2021-11-20 NOTE — Progress Notes (Signed)
?Cardiology Office Note:   ?Date:  11/23/2021  ?NAME:  Matthew Mann    ?MRN: [id number] ?DOB:  1965-12-10  ? ?PCP:  Loman Brooklyn, FNP  ?Cardiologist:  Evalina Field, MD  ?Electrophysiologist:  None  ? ?Referring MD: Loman Brooklyn, FNP  ? ?Chief Complaint  ?Patient presents with  ? Follow-up  ?   ?  ? ?History of Present Illness:   ?Matthew Mann is a 56 y.o. male with a hx of DM, HTN, HLD, CAD s/p CABG who presents for follow-up.  Doing well since bypass surgery.  Denies any chest pain or trouble breathing.  Working with cardiac rehab.  Seems to be doing well.  Blood pressure values range between [id number] systolically.  Likely on too much blood pressure medication.  His diabetes shows an A1c of 7.0.  Most recent LDL not at goal but has repeat values that are pending.  We discussed Repatha.  He would like to have his labs rechecked before we jump to injectable medications.  Overall doing quite well.  Exercising appropriately.  Hitting all of his exercise goals.  No evidence of pleural effusion.  Echo showed normal LV function.  No significant valvular heart disease. ? ?Problem List ?CAD/NSTEMI ?-CABG x 5 06/19/2021 ?2. DM ?-A1c 7.0 ?3. HLD ?-T chol 132, HDL 34, LDL 77, TG 112 ?4. HTN ? ?Past Medical History: ?Past Medical History:  ?Diagnosis Date  ? Diabetes mellitus without complication ([place])   ? Hyperlipidemia   ? Hypertension   ? ? ?Past Surgical History: ?Past Surgical History:  ?Procedure Laterality Date  ? CORONARY ARTERY BYPASS GRAFT N/A 06/19/2021  ? Procedure: CORONARY ARTERY BYPASS GRAFTING TIMES 5;  Surgeon: Lajuana Matte, MD;  Location: [place];  Service: Open Heart Surgery;  Laterality: N/A;  ? ENDOVEIN HARVEST OF GREATER SAPHENOUS VEIN Right 06/19/2021  ? Procedure: ENDOVEIN HARVEST OF GREATER SAPHENOUS VEIN;  Surgeon: Lajuana Matte, MD;  Location: [place];  Service: Open Heart Surgery;  Laterality: Right;  ? LEFT HEART CATH AND CORONARY ANGIOGRAPHY N/A 06/14/2021  ? Procedure: LEFT HEART  CATH AND CORONARY ANGIOGRAPHY;  Surgeon: Sherren Mocha, MD;  Location: [facility];  Service: Cardiovascular;  Laterality: N/A;  ? RADIAL ARTERY HARVEST Left 06/19/2021  ? Procedure: RADIAL ARTERY HARVEST;  Surgeon: Lajuana Matte, MD;  Location: [place];  Service: Open Heart Surgery;  Laterality: Left;  ? TEE WITHOUT CARDIOVERSION N/A 06/19/2021  ? Procedure: TRANSESOPHAGEAL ECHOCARDIOGRAM (TEE);  Surgeon: Lajuana Matte, MD;  Location: [place];  Service: Open Heart Surgery;  Laterality: N/A;  ? ? ?Current Medications: ?Current Meds  ?Medication Sig  ? aspirin 81 MG EC tablet Take 1 tablet by mouth daily.  ? atorvastatin (LIPITOR) 80 MG tablet Take 1 tablet (80 mg total) by mouth daily.  ? clopidogrel (PLAVIX) 75 MG tablet TAKE 1 TABLET BY MOUTH EVERY DAY  ? ezetimibe (ZETIA) 10 MG tablet Take 1 tablet (10 mg total) by mouth daily.  ? FARXIGA 10 MG TABS tablet TAKE 1 TABLET BY MOUTH EVERY DAY  ? losartan (COZAAR) 100 MG tablet TAKE 1 TABLET BY MOUTH EVERY DAY  ? metFORMIN (GLUCOPHAGE-XR) 500 MG 24 hr tablet TAKE 1 TABLET BY MOUTH TWICE A DAY (Patient taking differently: Take 1,000 mg by mouth daily with breakfast.)  ? metoprolol tartrate (LOPRESSOR) 25 MG tablet TAKE 1 TABLET BY MOUTH TWICE A DAY  ? Multiple Vitamins-Minerals (MULTIVITAMIN WITH MINERALS) tablet Take 2 tablets by mouth daily.  ?  OZEMPIC, 0.25 OR 0.5 MG/DOSE, 2 MG/3ML SOPN Inject into the skin once a week.  ? [DISCONTINUED] amLODipine (NORVASC) 5 MG tablet Take 1 tablet (5 mg total) by mouth daily.  ? [DISCONTINUED] OZEMPIC, 0.25 OR 0.5 MG/DOSE, 2 MG/1.5ML SOPN INJECT 0.25MG  INTO THE SKIN ONE TIME PER WEEK  ?  ? ?Allergies:    ?Ace inhibitors  ? ?Social History: ?Social History  ? ?Socioeconomic History  ? Marital status: Married  ?  Spouse name: Not on file  ? Number of children: Not on file  ? Years of education: Not on file  ? Highest education level: Not on file  ?Occupational History  ? Not on file  ?Tobacco Use  ? Smoking  status: Never  ? Smokeless tobacco: Never  ?Vaping Use  ? Vaping Use: Never used  ?Substance and Sexual Activity  ? Alcohol use: Never  ? Drug use: Never  ? Sexual activity: Yes  ?Other Topics Concern  ? Not on file  ?Social History Narrative  ? Not on file  ? ?Social Determinants of Health  ? ?Financial Resource Strain: Not on file  ?Food Insecurity: Not on file  ?Transportation Needs: Not on file  ?Physical Activity: Not on file  ?Stress: Not on file  ?Social Connections: Not on file  ?  ? ?Family History: ?The patient's family history includes Heart attack in his maternal grandfather; Heart disease in his sister; Hypertension in his mother. ? ?ROS:   ?All other ROS reviewed and negative. Pertinent positives noted in the HPI.    ? ?EKGs/Labs/Other Studies Reviewed:   ?The following studies were personally reviewed by me today: ? ? ?TTE 06/15/2021 ? 1. There appears to be fusion of left and right coronary cusps with  ?functionally bicuspid aortic valve; trace AI; mild AS (mean gradient 11  ?mmHg).  ? 2. Left ventricular ejection fraction, by estimation, is 60 to 65%. The  ?left ventricle has normal function. The left ventricle has no regional  ?wall motion abnormalities. Left ventricular diastolic parameters are  ?consistent with Grade I diastolic  ?dysfunction (impaired relaxation).  ? 3. Right ventricular systolic function is normal. The right ventricular  ?size is normal. Tricuspid regurgitation signal is inadequate for assessing  ?PA pressure.  ? 4. The mitral valve is normal in structure. Trivial mitral valve  ?regurgitation. No evidence of mitral stenosis.  ? 5. The aortic valve is bicuspid. Aortic valve regurgitation is trivial.  ?Mild aortic valve stenosis.  ? 6. The inferior vena cava is normal in size with greater than 50%  ?respiratory variability, suggesting right atrial pressure of 3 mmHg.  ? ?Recent Labs: ?06/20/2021: Magnesium 2.5 ?07/25/2021: ALT 23 ?09/03/2021: BUN 17; Creatinine, Ser 0.89;  Hemoglobin 15.8; Platelets 228; Potassium 3.6; Sodium 137  ? ?Recent Lipid Panel ?   ?Component Value Date/Time  ? CHOL 132 07/25/2021 1106  ? TRIG 112 07/25/2021 1106  ? HDL 34 (L) 07/25/2021 1106  ? CHOLHDL 3.9 07/25/2021 1106  ? CHOLHDL 3.6 06/15/2021 0621  ? VLDL 18 06/15/2021 0621  ? [place] 77 07/25/2021 1106  ? ? ?Physical Exam:   ?VS:  BP 140/82   Pulse 72   Ht 5\' 7"  (1.702 m)   Wt 165 lb 9.6 oz (75.1 kg)   SpO2 97%   BMI 25.94 kg/m?    ?Wt Readings from Last 3 Encounters:  ?11/23/21 165 lb 9.6 oz (75.1 kg)  ?11/13/21 163 lb 2.3 oz (74 kg)  ?10/30/21 164 lb 10.9 oz (74.7 kg)  ?  ?  General: Well nourished, well developed, in no acute distress ?Head: Atraumatic, normal size  ?Eyes: PEERLA, EOMI  ?Neck: Supple, no JVD ?Endocrine: No thryomegaly ?Cardiac: Normal S1, S2; RRR; no murmurs, rubs, or gallops ?Lungs: Clear to auscultation bilaterally, no wheezing, rhonchi or rales  ?Abd: Soft, nontender, no hepatomegaly  ?Ext: No edema, pulses 2+ ?Musculoskeletal: No deformities, BUE and BLE strength normal and equal ?Skin: Warm and dry, no rashes   ?Neuro: Alert and oriented to person, place, time, and situation, CNII-XII grossly intact, no focal deficits  ?Psych: Normal mood and affect  ? ?ASSESSMENT:   ?Onesimus Fousek is a 56 y.o. male who presents for the following: ?1. Coronary artery disease involving coronary bypass graft of native heart without angina pectoris   ?2. Mixed hyperlipidemia   ?3. Essential hypertension   ? ? ?PLAN:   ?1. Coronary artery disease involving coronary bypass graft of native heart without angina pectoris ?2. Mixed hyperlipidemia ?-Non-STEMI in October 2022.  Status post CABG.  Doing well.  Continue aspirin 81 mg daily.  He will complete 1 year of Plavix given non-STEMI. ?-No symptoms of angina.  On metoprolol succinate.  Blood pressure well controlled.  See discussion below.  Echo shows normal LV function.  No significant valvular heart disease. ?-Cholesterol level still not at goal.   On Lipitor 80 mg daily.  On Zetia 10 mg daily.  Recheck due next month.  He will let us know.  Likely would recommend PCSK9 inhibitor if levels are still elevated. ?-Diet and exercise goals recommended.  He is doing well

## 2021-11-20 NOTE — Progress Notes (Signed)
Daily Session Note ? ?Patient Details  ?Name: Matthew Mann ?MRN: [id number] ?Date of Birth: [birth year] ?Referring Provider:   ?Flowsheet Row CARDIAC REHAB PHASE II ORIENTATION from 09/07/2021 in [place]  ?Referring Provider Dr. Kipp Brood  ? ?  ? ? ?Encounter Date: 11/20/2021 ? ?Check In: ? Session Check In - 11/20/21 0923   ? ?  ? Check-In  ? Supervising physician immediately available to respond to emergencies [facility] MD immediately available   ? Physician(s) Dr Harl Bowie   ? Location [facility]   ? Staff Present Redge Gainer, BS, Exercise Physiologist;Izzie Geers Hassell Done, RN, Madlyn Frankel, RN, Jennye Moccasin, RN, BSN   ? Virtual Visit No   ? Medication changes reported     No   ? Fall or balance concerns reported    No   ? Tobacco Cessation No Change   ? Resistance Training Performed Yes   ? VAD Patient? No   ? PAD/SET Patient? No   ?  ? Pain Assessment  ? Currently in Pain? No/denies   ? Pain Score 0-No pain   ? Multiple Pain Sites No   ? ?  ?  ? ?  ? ? ?Capillary Blood Glucose: ?No results found for this or any previous visit (from the past 24 hour(s)). ? ? ? ?Social History  ? ?Tobacco Use  ?Smoking Status Never  ?Smokeless Tobacco Never  ? ? ?Goals Met:  ?Independence with exercise equipment ?Exercise tolerated well ?No report of concerns or symptoms today ?Strength training completed today ? ?Goals Unmet:  ?Not Applicable ? ?Comments: check out 1030 ? ? ?Dr. Carlyle Dolly is Medical Director for [place] ?

## 2021-11-22 ENCOUNTER — Encounter (HOSPITAL_COMMUNITY)
Admission: RE | Admit: 2021-11-22 | Discharge: 2021-11-22 | Disposition: A | Discharge status: Home / Self Care | Payer: 59 | Source: Ambulatory Visit | Attending: Cardiovascular Disease | Admitting: Cardiovascular Disease

## 2021-11-22 DIAGNOSIS — Z951 Presence of aortocoronary bypass graft: Secondary | ICD-10-CM

## 2021-11-22 DIAGNOSIS — I214 Non-ST elevation (NSTEMI) myocardial infarction: Secondary | ICD-10-CM

## 2021-11-22 NOTE — Progress Notes (Signed)
Daily Session Note ? ?Patient Details  ?Name: Matthew Mann ?MRN: [id number] ?Date of Birth: 1965-10-30 ?Referring Provider:   ?Flowsheet Row CARDIAC REHAB PHASE II ORIENTATION from 09/07/2021 in [place]  ?Referring Provider Dr. Kipp Brood  ? ?  ? ? ?Encounter Date: 11/22/2021 ? ?Check In: ? Session Check In - 11/22/21 0929   ? ?  ? Check-In  ? Supervising physician immediately available to respond to emergencies [facility] MD immediately available   ? Physician(s) Dr. Domenic Polite   ? Location [facility]   ? Staff Present Geanie Cooley, RN;Dalton Kris Mouton, MS, ACSM-CEP, Exercise Physiologist;Heather Zigmund Daniel, Exercise Physiologist   ? Virtual Visit No   ? Medication changes reported     No   ? Fall or balance concerns reported    No   ? Tobacco Cessation No Change   ? Warm-up and Cool-down Performed as group-led instruction   ? Resistance Training Performed Yes   ? VAD Patient? No   ? PAD/SET Patient? No   ?  ? Pain Assessment  ? Currently in Pain? No/denies   ? Pain Score 0-No pain   ? Multiple Pain Sites No   ? ?  ?  ? ?  ? ? ?Capillary Blood Glucose: ?No results found for this or any previous visit (from the past 24 hour(s)). ? ? ? ?Social History  ? ?Tobacco Use  ?Smoking Status Never  ?Smokeless Tobacco Never  ? ? ?Goals Met:  ?Independence with exercise equipment ?Exercise tolerated well ?No report of concerns or symptoms today ?Strength training completed today ? ?Goals Unmet:  ?Not Applicable ? ?Comments: checkout @ 12:00pm ? ? ?Dr. Carlyle Dolly is Medical Director for [place] ?

## 2021-11-23 ENCOUNTER — Ambulatory Visit (INDEPENDENT_AMBULATORY_CARE_PROVIDER_SITE_OTHER): Payer: 59 | Admitting: Cardiovascular Disease

## 2021-11-23 ENCOUNTER — Other Ambulatory Visit: Payer: Self-pay

## 2021-11-23 ENCOUNTER — Encounter: Payer: Self-pay | Admitting: Cardiovascular Disease

## 2021-11-23 VITALS — BP 140/82 | HR 72 | Ht 67.0 in | Wt 165.6 lb

## 2021-11-23 DIAGNOSIS — E782 Mixed hyperlipidemia: Secondary | ICD-10-CM | POA: Diagnosis not present

## 2021-11-23 DIAGNOSIS — I25810 Atherosclerosis of coronary artery bypass graft(s) without angina pectoris: Secondary | ICD-10-CM | POA: Diagnosis not present

## 2021-11-23 DIAGNOSIS — I10 Essential (primary) hypertension: Secondary | ICD-10-CM | POA: Diagnosis not present

## 2021-11-23 NOTE — Patient Instructions (Signed)
Medication Instructions:  ?STOP Amlodipine  ? ?*If you need a refill on your cardiac medications before your next appointment, please call your pharmacy* ? ? ?Lab Work: ?Send results from primary care provider [phone] ?If you have labs (blood work) drawn today and your tests are completely normal, you will receive your results only by: ?MyChart Message (if you have MyChart) OR ?A paper copy in the mail ?If you have any lab test that is abnormal or we need to change your treatment, we will call you to review the results. ? ? ?Follow-Up: ?At [facility], you and your health needs are our priority.  As part of our continuing mission to provide you with exceptional heart care, we have created designated Provider Care Teams.  These Care Teams include your primary Cardiologist (physician) and Advanced Practice Providers (APPs -  Physician Assistants and Nurse Practitioners) who all work together to provide you with the care you need, when you need it. ? ?We recommend signing up for the patient portal called "MyChart".  Sign up information is provided on this After Visit Summary.  MyChart is used to connect with patients for Virtual Visits (Telemedicine).  Patients are able to view lab/test results, encounter notes, upcoming appointments, etc.  Non-urgent messages can be sent to your provider as well.   ?To learn more about what you can do with MyChart, go to [email or web address].   ? ?Your next appointment:   ?6 month(s) ? ?The format for your next appointment:   ?In Person ? ?Provider:   ?Reatha Harps, MD   ? ? ?

## 2021-11-24 ENCOUNTER — Encounter (HOSPITAL_COMMUNITY)
Admission: RE | Admit: 2021-11-24 | Discharge: 2021-11-24 | Disposition: A | Discharge status: Home / Self Care | Payer: 59 | Source: Ambulatory Visit | Attending: Cardiovascular Disease | Admitting: Cardiovascular Disease

## 2021-11-24 DIAGNOSIS — I214 Non-ST elevation (NSTEMI) myocardial infarction: Secondary | ICD-10-CM

## 2021-11-24 DIAGNOSIS — Z951 Presence of aortocoronary bypass graft: Secondary | ICD-10-CM

## 2021-11-24 NOTE — Progress Notes (Signed)
Daily Session Note ? ?Patient Details  ?Name: Matthew Mann ?MRN: [id number] ?Date of Birth: [birth year] ?Referring Provider:   ?Flowsheet Row CARDIAC REHAB PHASE II ORIENTATION from 09/07/2021 in [place]  ?Referring Provider Dr. Kipp Brood  ? ?  ? ? ?Encounter Date: 11/24/2021 ? ?Check In: ? Session Check In - 11/24/21 0930   ? ?  ? Check-In  ? Supervising physician immediately available to respond to emergencies [facility] MD immediately available   ? Physician(s) Dr Gardiner Rhyme   ? Location [facility]   ? Staff Present Redge Gainer, BS, Exercise Physiologist;Lorne Winkels Hassell Done, RN, BSN;Christy Edwards, RN, BSN   ? Virtual Visit No   ? Medication changes reported     No   ? Fall or balance concerns reported    No   ? Tobacco Cessation No Change   ? Warm-up and Cool-down Performed as group-led instruction   ? Resistance Training Performed Yes   ? VAD Patient? No   ? PAD/SET Patient? No   ?  ? Pain Assessment  ? Currently in Pain? No/denies   ? Pain Score 0-No pain   ? Multiple Pain Sites No   ? ?  ?  ? ?  ? ? ?Capillary Blood Glucose: ?No results found for this or any previous visit (from the past 24 hour(s)). ? ? ? ?Social History  ? ?Tobacco Use  ?Smoking Status Never  ?Smokeless Tobacco Never  ? ? ?Goals Met:  ?Independence with exercise equipment ?Exercise tolerated well ?No report of concerns or symptoms today ?Strength training completed today ? ?Goals Unmet:  ?Not Applicable ? ?Comments: Checkout at 1030. ? ? ?Dr. Carlyle Dolly is Medical Director for [place] ?

## 2021-11-27 ENCOUNTER — Encounter (HOSPITAL_COMMUNITY)
Admission: RE | Admit: 2021-11-27 | Discharge: 2021-11-27 | Disposition: A | Discharge status: Home / Self Care | Payer: 59 | Source: Ambulatory Visit | Attending: Cardiovascular Disease | Admitting: Cardiovascular Disease

## 2021-11-27 DIAGNOSIS — Z951 Presence of aortocoronary bypass graft: Secondary | ICD-10-CM

## 2021-11-27 DIAGNOSIS — I214 Non-ST elevation (NSTEMI) myocardial infarction: Secondary | ICD-10-CM

## 2021-11-27 NOTE — Progress Notes (Signed)
Daily Session Note ? ?Patient Details  ?Name: Matthew Mann ?MRN: [id number] ?Date of Birth: 12-15-1965 ?Referring Provider:   ?Flowsheet Row CARDIAC REHAB PHASE II ORIENTATION from 09/07/2021 in [place]  ?Referring Provider Dr. Kipp Brood  ? ?  ? ? ?Encounter Date: 11/27/2021 ? ?Check In: ? Session Check In - 11/27/21 0929   ? ?  ? Check-In  ? Supervising physician immediately available to respond to emergencies [facility] MD immediately available   ? Physician(s) Dr Marlou Porch   ? Location [facility]   ? Staff Present Redge Gainer, BS, Exercise Physiologist;Jordanne Elsbury Hassell Done, RN, Bjorn Loser, MS, ACSM-CEP, Exercise Physiologist   ? Virtual Visit No   ? Medication changes reported     No   ? Fall or balance concerns reported    No   ? Tobacco Cessation No Change   ? Warm-up and Cool-down Performed as group-led instruction   ? Resistance Training Performed Yes   ? VAD Patient? No   ? PAD/SET Patient? No   ?  ? Pain Assessment  ? Currently in Pain? No/denies   ? Pain Score 0-No pain   ? Multiple Pain Sites No   ? ?  ?  ? ?  ? ? ?Capillary Blood Glucose: ?No results found for this or any previous visit (from the past 24 hour(s)). ? ? ? ?Social History  ? ?Tobacco Use  ?Smoking Status Never  ?Smokeless Tobacco Never  ? ? ?Goals Met:  ?Independence with exercise equipment ?Exercise tolerated well ?No report of concerns or symptoms today ?Strength training completed today ? ?Goals Unmet:  ?Not Applicable ? ?Comments: Check out at 1030 ? ? ?Dr. Carlyle Dolly is Medical Director for [place] ?

## 2021-11-29 ENCOUNTER — Encounter (HOSPITAL_COMMUNITY)
Admission: RE | Admit: 2021-11-29 | Discharge: 2021-11-29 | Disposition: A | Discharge status: Home / Self Care | Payer: 59 | Source: Ambulatory Visit | Attending: Cardiovascular Disease | Admitting: Cardiovascular Disease

## 2021-11-29 DIAGNOSIS — Z951 Presence of aortocoronary bypass graft: Secondary | ICD-10-CM

## 2021-11-29 DIAGNOSIS — I214 Non-ST elevation (NSTEMI) myocardial infarction: Secondary | ICD-10-CM | POA: Diagnosis not present

## 2021-12-01 ENCOUNTER — Encounter (HOSPITAL_COMMUNITY)
Admission: RE | Admit: 2021-12-01 | Discharge: 2021-12-01 | Disposition: A | Discharge status: Home / Self Care | Payer: 59 | Source: Ambulatory Visit | Attending: Cardiovascular Disease | Admitting: Cardiovascular Disease

## 2021-12-01 VITALS — Ht 67.0 in | Wt 161.8 lb

## 2021-12-01 DIAGNOSIS — I214 Non-ST elevation (NSTEMI) myocardial infarction: Secondary | ICD-10-CM | POA: Diagnosis not present

## 2021-12-01 DIAGNOSIS — Z951 Presence of aortocoronary bypass graft: Secondary | ICD-10-CM

## 2021-12-01 NOTE — Progress Notes (Signed)
Daily Session Note ? ?Patient Details  ?Name: Matthew Mann ?MRN: [id number] ?Date of Birth: 06-Feb-1966 ?Referring Provider:   ?Flowsheet Row CARDIAC REHAB PHASE II ORIENTATION from 09/07/2021 in [place]  ?Referring Provider Dr. Kipp Brood  ? ?  ? ? ?Encounter Date: 12/01/2021 ? ?Check In: ? Session Check In - 12/01/21 0925   ? ?  ? Check-In  ? Supervising physician immediately available to respond to emergencies [facility] MD immediately available   ? Physician(s) Dr Harl Bowie   ? Location [facility]   ? Staff Present Madelyn Flavors, RN, BSN;Christy Edwards, RN, Bjorn Loser, MS, ACSM-CEP, Exercise Physiologist   ? Virtual Visit No   ? Medication changes reported     No   ? Fall or balance concerns reported    No   ? Tobacco Cessation No Change   ? Warm-up and Cool-down Performed as group-led instruction   ? Resistance Training Performed Yes   ? VAD Patient? No   ? PAD/SET Patient? No   ?  ? Pain Assessment  ? Currently in Pain? No/denies   ? Pain Score 0-No pain   ? Multiple Pain Sites No   ? ?  ?  ? ?  ? ? ?Capillary Blood Glucose: ?No results found for this or any previous visit (from the past 24 hour(s)). ? ? ? ?Social History  ? ?Tobacco Use  ?Smoking Status Never  ?Smokeless Tobacco Never  ? ? ?Goals Met:  ?Independence with exercise equipment ?Exercise tolerated well ?No report of concerns or symptoms today ?Strength training completed today ? ?Goals Unmet:  ?Not Applicable ? ?Comments: Checkout at 1030 ? ? ?Dr. Carlyle Dolly is Medical Director for [place] ?

## 2021-12-05 ENCOUNTER — Ambulatory Visit (INDEPENDENT_AMBULATORY_CARE_PROVIDER_SITE_OTHER): Payer: 59 | Admitting: Family Medicine

## 2021-12-05 ENCOUNTER — Encounter: Payer: Self-pay | Admitting: Family Medicine

## 2021-12-05 VITALS — BP 134/87 | HR 81 | Temp 97.7°F | Ht 67.0 in | Wt 163.2 lb

## 2021-12-05 DIAGNOSIS — E1141 Type 2 diabetes mellitus with diabetic mononeuropathy: Secondary | ICD-10-CM | POA: Diagnosis not present

## 2021-12-05 DIAGNOSIS — E782 Mixed hyperlipidemia: Secondary | ICD-10-CM

## 2021-12-05 DIAGNOSIS — N138 Other obstructive and reflux uropathy: Secondary | ICD-10-CM

## 2021-12-05 DIAGNOSIS — Z0001 Encounter for general adult medical examination with abnormal findings: Secondary | ICD-10-CM

## 2021-12-05 DIAGNOSIS — I10 Essential (primary) hypertension: Secondary | ICD-10-CM | POA: Diagnosis not present

## 2021-12-05 DIAGNOSIS — N401 Enlarged prostate with lower urinary tract symptoms: Secondary | ICD-10-CM

## 2021-12-05 DIAGNOSIS — Z951 Presence of aortocoronary bypass graft: Secondary | ICD-10-CM

## 2021-12-05 DIAGNOSIS — I252 Old myocardial infarction: Secondary | ICD-10-CM

## 2021-12-05 DIAGNOSIS — Z0000 Encounter for general adult medical examination without abnormal findings: Secondary | ICD-10-CM

## 2021-12-05 LAB — BAYER DCA HB A1C WAIVED: HB A1C (BAYER DCA - WAIVED): 8.9 % — ABNORMAL HIGH (ref 4.8–5.6)

## 2021-12-05 MED ORDER — METFORMIN HCL ER 500 MG PO TB24: 1000.0000 mg | ORAL_TABLET | Freq: Every day | ORAL | 1 refills | Status: DC

## 2021-12-05 MED ORDER — SEMAGLUTIDE (1 MG/DOSE) 4 MG/3ML ~~LOC~~ SOPN: 1.0000 mg | PEN_INJECTOR | SUBCUTANEOUS | 2 refills | Status: DC

## 2021-12-05 NOTE — Progress Notes (Signed)
Discharge Progress Report ? ?Patient Details  ?Name: Matthew Mann ?MRN: [id number] ?Date of Birth: 1966/06/16 ?Referring Provider:   ?Flowsheet Row CARDIAC REHAB PHASE II ORIENTATION from 09/07/2021 in [place]  ?Referring Provider Dr. Kipp Brood  ? ?  ? ? ? ?Number of Visits: 33 ? ?Reason for Discharge:  ?Patient reached a stable level of exercise. ?Patient independent in their exercise. ?Patient has met program and personal goals. ? ?Smoking History:  ?Social History  ? ?Tobacco Use  ?Smoking Status Never  ?Smokeless Tobacco Never  ? ? ?Diagnosis:  ?NSTEMI (non-ST elevated myocardial infarction) ([place]) ? ?S/P CABG x 5 ? ?ADL [facility]: ? ? ?Initial Exercise Prescription: ? Initial Exercise Prescription - 09/07/21 1400   ? ?  ? Date of Initial Exercise RX and Referring Provider  ? Date 09/07/21   ? Referring Provider Dr. Kipp Brood   ? Expected Discharge Date 12/01/21   ?  ? Treadmill  ? MPH 2.4   ? Grade 0   ? Minutes 22   ?  ? [place]umbant Elliptical  ? Level 1   ? RPM 50   ? Minutes 17   ?  ? Prescription Details  ? Frequency (times per week) 3   ? Duration Progress to 30 minutes of continuous aerobic without signs/symptoms of physical distress   ?  ? Intensity  ? THRR 40-80% of Max Heartrate 67-134   ? Ratings of Perceived Exertion 11-13   ? Perceived Dyspnea 0-4   ?  ? Resistance Training  ? Training Prescription Yes   ? Weight 4 lbs   ? Reps 10-15   ? ?  ?  ? ?  ? ? ?Discharge Exercise Prescription (Final Exercise Prescription Changes): ? Exercise Prescription Changes - 11/13/21 1029   ? ?  ? Response to Exercise  ? Blood Pressure (Admit) 118/70   ? Blood Pressure (Exercise) 120/78   ? Blood Pressure (Exit) 106/78   ? Heart Rate (Admit) 79 bpm   ? Heart Rate (Exercise) 101 bpm   ? Heart Rate (Exit) 88 bpm   ? Rating of Perceived Exertion (Exercise) 12   ? Duration Continue with 30 min of aerobic exercise without signs/symptoms of physical distress.   ? Intensity THRR unchanged   ?  ? Progression  ?  Progression Continue to progress workloads to maintain intensity without signs/symptoms of physical distress.   ?  ? Resistance Training  ? Training Prescription Yes   ? Weight 5   ? Reps 10-15   ? Time 10 Minutes   ?  ? Treadmill  ? MPH 3   ? Grade 1   ? Minutes 22   ? METs 3.71   ?  ? Recumbant Elliptical  ? Level 3   ? RPM 67   ? Minutes 17   ? METs 4.8   ? ?  ?  ? ?  ? ? ?Functional Capacity: ? 6 Minute Walk   ? ? First [place] Name 09/07/21 1404 12/01/21 1000  ?  ?  ? 6 Minute Walk  ? Phase Initial Discharge   ? Distance 1300 feet 1525 feet   ? Distance % Change -- 17.3 %   ? Distance Feet Change -- 225 ft   ? Walk Time 6 minutes 6 minutes   ? # of Rest Breaks 0 0   ? MPH 2.46 2.89   ? METS 3.93 4.16   ? RPE 11 11   ? VO2 Peak 13.77 14.57   ?  Symptoms No No   ? Resting HR 91 bpm 75 bpm   ? Resting BP 120/80 120/80   ? Resting Oxygen Saturation  98 % 98 %   ? Exercise Oxygen Saturation  during 6 min walk 99 % 100 %   ? Max Ex. HR 95 bpm 89 bpm   ? Max Ex. BP 136/72 128/72   ? 2 Minute Post BP 118/78 106/70   ? ?  ?  ? ?  ? ? ?Psychological, QOL, Others - Outcomes: ?[place] 2/9: ? ?  12/05/2021  ? 10:22 AM 09/08/2021  ?  9:53 AM 09/07/2021  ?  1:39 PM 07/25/2021  ? 10:33 AM 04/27/2021  ? 10:18 AM  ?Depression screen PHQ 2/9  ?Decreased Interest 0 0 0 0 0  ?Down, Depressed, Hopeless 0 0 0  0  ?PHQ - 2 Score 0 0 0 0 0  ?Altered sleeping 0 1 0  0  ?Tired, decreased energy 0 0 1  0  ?Change in appetite 0 0 0  0  ?Feeling bad or failure about yourself  0 0 0  0  ?Trouble concentrating 0 0 0  0  ?Moving slowly or fidgety/restless 0 0 0  0  ?Suicidal thoughts 0 0 0  0  ?PHQ-9 Score 0 1 1  0  ?Difficult doing work/chores Not difficult at all Not difficult at all Not difficult at all  Not difficult at all  ? ? ?Quality of Life: ? Quality of Life - 12/05/21 1024   ? ?  ? Quality of Life Scores  ? Health/Function Pre 25.87 %   ? Health/Function Post 27.17 %   ? Health/Function % Change 5.03 %   ? Socioeconomic Pre 27.5 %   ? Socioeconomic Post  26.43 %   ? Socioeconomic % Change  -3.89 %   ? Psych/Spiritual Pre 28.57 %   ? Psych/Spiritual Post 28.57 %   ? Psych/Spiritual % Change 0 %   ? Family Pre 28.3 %   ? Family Post 30 %   ? Family % Change 6.01 %   ? GLOBAL Pre 27.12 %   ? GLOBAL Post 27.72 %   ? GLOBAL % Change 2.21 %   ? ?  ?  ? ?  ? ? ?Personal Goals: ?Goals established at orientation with interventions provided to work toward goal. ? Personal Goals and Risk Factors at Admission - 09/07/21 1410   ? ?  ? Core Components/Risk Factors/Patient Goals on Admission  ?  Weight Management Weight Maintenance   ? Diabetes Yes   ? Intervention Provide education about signs/symptoms and action to take for hypo/hyperglycemia.;Provide education about proper nutrition, including hydration, and aerobic/resistive exercise prescription along with prescribed medications to achieve blood glucose in normal ranges: Fasting glucose 65-99 mg/dL   ? Expected Outcomes Short Term: Participant verbalizes understanding of the signs/symptoms and immediate care of hyper/hypoglycemia, proper foot care and importance of medication, aerobic/resistive exercise and nutrition plan for blood glucose control.;Long Term: Attainment of HbA1C < 7%.   ? Lipids Yes   ? Intervention Provide education and support for participant on nutrition & aerobic/resistive exercise along with prescribed medications to achieve LDL <78m, HDL >448m   ? Expected Outcomes Short Term: Participant states understanding of desired cholesterol values and is compliant with medications prescribed. Participant is following exercise prescription and nutrition guidelines.;Long Term: Cholesterol controlled with medications as prescribed, with individualized exercise RX and with personalized nutrition plan. Value goals: LDL < 7074mHDL >  40 mg.   ? Personal Goal Other Yes   ? Personal Goal Patient wants to get stronger; recovy from his surgery and get back to his ADL's and working full-time as Theme park manager.   ? Intervention  Patient will attend the CR program with exercise and education and supplement with exercise at home.   ? Expected Outcomes Patient will complete the program meeting both personal and program goals.   ? ?  ?  ? ?  ?  ? ?Personal Goals Discharge: ? Goals and Risk Factor Review   ? ? [place] Name 09/25/21 1427 10/25/21 0857 12/05/21 1027  ?  ?  ?  ? Core Components/Risk Factors/Patient Goals Review  ? Personal Goals Review Weight Management/Obesity;Diabetes;Lipids;Other Weight Management/Obesity;Diabetes;Lipids;Other Weight Management/Obesity;Diabetes;Lipids;Other    ? Review Patient was referred to CR with NSTEMI. He has multiple risk factors for CAD and is participating in the program for risk modification. He has completed 6 sessions with his current weight being 162.7 lbs. His intial weight was 162.0 lbs. His blood pressure is at goal. He saw his pcp 09/08/21. She increased Ozempic to 0.5 mg for tighter DM control with a goal of A1C <7%. Last was at 7.0% in November which has trended down form 11.7% 9 months ago. His personal goals for the program are to get stronger; recover from surgery and get back to his ADL's. We will continue to monitor his progress as he works towards meeting these goals. Patient has completed 16 sessions with his current weight being 163.3 lbs [place] 1 lb from last 30 day review. He continues to do well in the program with consistent attendance and progressions. His blood pressure is well controlled. His personal goals for the program continue to be to get stronger; recover from surgery and get back to his ADL's. We will continue to monitor his progress as he works towards meeting these goals. Pt graduated from CR after 33 sessions. His weight was stable while he was in the program. He was hypertensive when he first began the program, but his BP was WNLs at the end of the program. All other vitals were WNLs. He was able to improve his walk test distance by 17.3%. He reports feeling much better than  he did when he first began the program. He reports that his strength and stamina are back to his baseline.    ? Expected Outcomes Patient will complete the program meeting both personal and program goals.

## 2021-12-05 NOTE — Progress Notes (Signed)
? ?Assessment & Plan:  ?1. Well adult exam ?Preventive health education provided. ?- PSA, total and free; Future ? ?2. Type 2 diabetes mellitus with diabetic mononeuropathy, without long-term current use of insulin ([place]) ?Lab Results  ?Component Value Date  ? HGBA1C 8.9 (H) 12/05/2021  ? HGBA1C 7.0 (H) 07/25/2021  ? HGBA1C 8.4 (H) 06/19/2021  ?  ?- Diabetes is not at goal of A1c < 7. ?- Medications: continue current medications with an increase in Ozempic from 0.5 mg to 1 mg weekly. ?- Home glucose monitoring: continue monitoring. ?- Patient is currently taking a statin. Patient is taking an ACE-inhibitor/ARB.  ? ?Diabetes Health Maintenance Due  ?Topic Date Due  ? FOOT EXAM  02/21/2022  ? HEMOGLOBIN A1C  06/06/2022  ? OPHTHALMOLOGY EXAM  10/20/2022  ?  ?Lab Results  ?Component Value Date  ? LABMICR 4.8 07/25/2021  ? ?- Bayer DCA Hb A1c Waived ?- metFORMIN (GLUCOPHAGE-XR) 500 MG 24 hr tablet; Take 2 tablets (1,000 mg total) by mouth daily with breakfast.  Dispense: 180 tablet; Refill: 1 ?- CBC with Differential/Platelet; Future ?- CMP14+EGFR; Future ?- Lipid panel; Future ?- Vitamin B12; Future ?- Semaglutide, 1 MG/DOSE, 4 MG/3ML SOPN; Inject 1 mg as directed once a week.  Dispense: 3 mL; Refill: 2 ? ?3. Essential hypertension ?Well controlled on current regimen.  ? ?4. Mixed hyperlipidemia ?Well controlled on current regimen.  ? ?5-6. History of non-ST elevation myocardial infarction (NSTEMI)/S/P CABG x 5 ?Continue ASA, Plavix, and Atorvastatin. Managed by cardiology.  ? ?7. BPH with obstruction/lower urinary tract symptoms ?- Ambulatory referral to Urology ? ? ?Follow-up: Return in about 3 months (around 03/06/2022) for DM.  ? ?Hendricks Limes, MSN, APRN, FNP-C ?[place] ? ?Subjective:  ?Patient ID: Matthew Mann, male    DOB: [birth year]  Age: 56 y.o. MRN: [id number] ? ?Patient Care Team: ?Loman Brooklyn, FNP as PCP - General (Family Medicine) ?Geralynn Rile, MD as PCP - Cardiology  (Cardiology) ?[employer] ?Deberah Pelton, NP as Nurse Practitioner (Cardiology)  ? ?CC:  ?Chief Complaint  ?Patient presents with  ? Annual Exam  ? ? ?HPI ?Matthew Mann presents for his annual physical.  ? ?Occupation: Theme park manager, Marital status: Married, Substance use: None ?Diet: healthy, Exercise: just finished cardiac rehab ?Last eye exam: 10/20/2021 ?Last dental exam: last year ?Last coloreactal cancer screening: negative Cologuard on 08/03/2021 ?Hepatitis C Screening: declined ?Immunizations:  ?Tdap Vaccine: declined  ?Shingrix Vaccine: declined  ?COVID-19 Vaccine:  declined booster ?Pneumonia Vaccine: declined ? ?DEPRESSION SCREENING ? ?  12/05/2021  ?  3:04 PM 12/05/2021  ? 10:22 AM 09/08/2021  ?  9:53 AM 09/07/2021  ?  1:39 PM 07/25/2021  ? 10:33 AM 04/27/2021  ? 10:18 AM 02/21/2021  ?  8:36 AM  ?PHQ 2/9 Scores  ?PHQ - 2 Score 0 0 0 0 0 0 0  ?PHQ- 9 Score 0 0 1 1  0 0  ?  ?Diabetes: Patient presents for follow up of diabetes. Current symptoms include: hyperglycemia. Known diabetic complications: cardiovascular disease. Medication compliance: yes. Current diet: in general, a "healthy" diet  . Current exercise:  just completed cardiac rehab . Home blood sugar records:  <200 . Is he  on ACE inhibitor or angiotensin II receptor blocker? Yes (Losartan). Is he on a statin? Yes (Atorvastatin). ? ?Hypertension: patient brought a report of BP readings from cardiac rehab where he ranges 92-134/56-86 with only 1/30 readings >491 systolic.   ? ? ?Review of Systems  ?  Constitutional:  Negative for chills, fever, malaise/fatigue and weight loss.  ?HENT:  Negative for congestion, ear discharge, ear pain, nosebleeds, sinus pain, sore throat and tinnitus.   ?Eyes:  Negative for blurred vision, double vision, pain, discharge and redness.  ?Respiratory:  Negative for cough, shortness of breath and wheezing.   ?Cardiovascular:  Negative for chest pain, palpitations and leg swelling.  ?Gastrointestinal:  Negative for abdominal pain,  constipation, diarrhea, heartburn, nausea and vomiting.  ?Genitourinary:  Negative for dysuria, frequency and urgency.  ?     Denies trouble initiating a urine stream, split stream, nocturia, and dribbling. He does have a weak stream during the night and it takes him forever to empty his bladder.  ?Musculoskeletal:  Negative for myalgias.  ?Skin:  Negative for rash.  ?Neurological:  Negative for dizziness, seizures, weakness and headaches.  ?Psychiatric/Behavioral:  Negative for depression, substance abuse and suicidal ideas. The patient is not nervous/anxious.   ? ? ?Current Outpatient Medications:  ?  acetaminophen (TYLENOL) 500 MG tablet, Take 2 tablets (1,000 mg total) by mouth every 6 (six) hours as needed., Disp: 30 tablet, Rfl: 0 ?  aspirin 81 MG EC tablet, Take 1 tablet by mouth daily., Disp: , Rfl:  ?  atorvastatin (LIPITOR) 80 MG tablet, Take 1 tablet (80 mg total) by mouth daily., Disp: 90 tablet, Rfl: 1 ?  clopidogrel (PLAVIX) 75 MG tablet, TAKE 1 TABLET BY MOUTH EVERY DAY, Disp: 90 tablet, Rfl: 3 ?  FARXIGA 10 MG TABS tablet, TAKE 1 TABLET BY MOUTH EVERY DAY, Disp: 90 tablet, Rfl: 0 ?  losartan (COZAAR) 100 MG tablet, TAKE 1 TABLET BY MOUTH EVERY DAY, Disp: 90 tablet, Rfl: 0 ?  metFORMIN (GLUCOPHAGE-XR) 500 MG 24 hr tablet, TAKE 1 TABLET BY MOUTH TWICE A DAY (Patient taking differently: Take 1,000 mg by mouth daily with breakfast.), Disp: 180 tablet, Rfl: 0 ?  metoprolol tartrate (LOPRESSOR) 25 MG tablet, TAKE 1 TABLET BY MOUTH TWICE A DAY, Disp: 180 tablet, Rfl: 3 ?  Multiple Vitamins-Minerals (MULTIVITAMIN WITH MINERALS) tablet, Take 2 tablets by mouth daily., Disp: , Rfl:  ?  OZEMPIC, 0.25 OR 0.5 MG/DOSE, 2 MG/3ML SOPN, Inject into the skin once a week., Disp: , Rfl:  ?  ezetimibe (ZETIA) 10 MG tablet, Take 1 tablet (10 mg total) by mouth daily., Disp: 90 tablet, Rfl: 3 ?  methocarbamol (ROBAXIN) 500 MG tablet, Take 1 tablet (500 mg total) by mouth every 8 (eight) hours as needed for muscle spasms.  (Patient not taking: Reported on 11/23/2021), Disp: 60 tablet, Rfl: 1 ?  traZODone (DESYREL) 50 MG tablet, Take 1 tablet (50 mg total) by mouth at bedtime as needed. for sleep (Patient not taking: Reported on 11/23/2021), Disp: 90 tablet, Rfl: 1 ? ?Allergies  ?Allergen Reactions  ? Ace Inhibitors Swelling and Other (See Comments)  ?  Tongue swelling and cough. ?  ? ? ?Past Medical History:  ?Diagnosis Date  ? Diabetes mellitus without complication ([place])   ? Hyperlipidemia   ? Hypertension   ? ? ?Past Surgical History:  ?Procedure Laterality Date  ? CORONARY ARTERY BYPASS GRAFT N/A 06/19/2021  ? Procedure: CORONARY ARTERY BYPASS GRAFTING TIMES 5;  Surgeon: Lajuana Matte, MD;  Location: [facility];  Service: Open Heart Surgery;  Laterality: N/A;  ? ENDOVEIN HARVEST OF GREATER SAPHENOUS VEIN Right 06/19/2021  ? Procedure: ENDOVEIN HARVEST OF GREATER SAPHENOUS VEIN;  Surgeon: Lajuana Matte, MD;  Location: [facility];  Service: Open Heart Surgery;  Laterality: Right;  ?  LEFT HEART CATH AND CORONARY ANGIOGRAPHY N/A 06/14/2021  ? Procedure: LEFT HEART CATH AND CORONARY ANGIOGRAPHY;  Surgeon: Sherren Mocha, MD;  Location: [facility];  Service: Cardiovascular;  Laterality: N/A;  ? RADIAL ARTERY HARVEST Left 06/19/2021  ? Procedure: RADIAL ARTERY HARVEST;  Surgeon: Lajuana Matte, MD;  Location: [facility];  Service: Open Heart Surgery;  Laterality: Left;  ? TEE WITHOUT CARDIOVERSION N/A 06/19/2021  ? Procedure: TRANSESOPHAGEAL ECHOCARDIOGRAM (TEE);  Surgeon: Lajuana Matte, MD;  Location: [facility];  Service: Open Heart Surgery;  Laterality: N/A;  ? ? ?Family History  ?Problem Relation Age of Onset  ? Hypertension Mother   ? Heart disease Sister   ? Heart attack Maternal Grandfather   ? ? ?Social History  ? ?Socioeconomic History  ? Marital status: Married  ?  Spouse name: Not on file  ? Number of children: Not on file  ? Years of education: Not on file  ? Highest education level: Not on file  ?Occupational  History  ? Not on file  ?Tobacco Use  ? Smoking status: Never  ? Smokeless tobacco: Never  ?Vaping Use  ? Vaping Use: Never used  ?Substance and Sexual Activity  ? Alcohol use: Never  ? Drug use: Never  ? Sexu

## 2021-12-06 ENCOUNTER — Other Ambulatory Visit: Payer: 59

## 2021-12-06 DIAGNOSIS — Z0000 Encounter for general adult medical examination without abnormal findings: Secondary | ICD-10-CM

## 2021-12-06 DIAGNOSIS — E1141 Type 2 diabetes mellitus with diabetic mononeuropathy: Secondary | ICD-10-CM

## 2021-12-07 LAB — LIPID PANEL
Chol/HDL Ratio: 2.6 ratio (ref 0.0–5.0)
Cholesterol, Total: 72 mg/dL — ABNORMAL LOW (ref 100–199)
HDL: 28 mg/dL — ABNORMAL LOW (ref >39)
LDL Chol Calc (NIH): 26 mg/dL (ref 0–99)
Triglycerides: 90 mg/dL (ref 0–149)
VLDL Cholesterol Cal: 18 mg/dL (ref 5–40)

## 2021-12-07 LAB — CMP14+EGFR
ALT: 33 IU/L (ref 0–44)
AST: 24 IU/L (ref 0–40)
Albumin/Globulin Ratio: 2 (ref 1.2–2.2)
Albumin: 4.5 g/dL (ref 3.8–4.9)
Alkaline Phosphatase: 73 IU/L (ref 44–121)
BUN/Creatinine Ratio: 17 (ref 9–20)
BUN: 19 mg/dL (ref 6–24)
Bilirubin Total: 1.1 mg/dL (ref 0.0–1.2)
CO2: 22 mmol/L (ref 20–29)
Calcium: 9.8 mg/dL (ref 8.7–10.2)
Chloride: 100 mmol/L (ref 96–106)
Creatinine, Ser: 1.13 mg/dL (ref 0.76–1.27)
Globulin, Total: 2.2 g/dL (ref 1.5–4.5)
Glucose: 150 mg/dL — ABNORMAL HIGH (ref 70–99)
Potassium: 4.2 mmol/L (ref 3.5–5.2)
Sodium: 137 mmol/L (ref 134–144)
Total Protein: 6.7 g/dL (ref 6.0–8.5)
eGFR: 77 mL/min/{1.73_m2} (ref >59)

## 2021-12-07 LAB — CBC WITH DIFFERENTIAL/PLATELET
Basophils Absolute: 0 10*3/uL (ref 0.0–0.2)
Basos: 1 %
EOS (ABSOLUTE): 0.2 10*3/uL (ref 0.0–0.4)
Eos: 2 %
Hematocrit: 46.1 % (ref 37.5–51.0)
Hemoglobin: 15.6 g/dL (ref 13.0–17.7)
Immature Grans (Abs): 0 10*3/uL (ref 0.0–0.1)
Immature Granulocytes: 0 %
Lymphocytes Absolute: 1.7 10*3/uL (ref 0.7–3.1)
Lymphs: 21 %
MCH: 29.6 pg (ref 26.6–33.0)
MCHC: 33.8 g/dL (ref 31.5–35.7)
MCV: 88 fL (ref 79–97)
Monocytes Absolute: 0.6 10*3/uL (ref 0.1–0.9)
Monocytes: 7 %
Neutrophils Absolute: 5.7 10*3/uL (ref 1.4–7.0)
Neutrophils: 69 %
Platelets: 248 10*3/uL (ref 150–450)
RBC: 5.27 x10E6/uL (ref 4.14–5.80)
RDW: 13.1 % (ref 11.6–15.4)
WBC: 8.2 10*3/uL (ref 3.4–10.8)

## 2021-12-07 LAB — VITAMIN B12: Vitamin B-12: 679 pg/mL (ref 232–1245)

## 2021-12-07 LAB — PSA, TOTAL AND FREE
PSA, Free Pct: 24.4 %
PSA, Free: 0.22 ng/mL
Prostate Specific Ag, Serum: 0.9 ng/mL (ref 0.0–4.0)

## 2021-12-27 ENCOUNTER — Ambulatory Visit: Payer: 59 | Admitting: Urology

## 2022-01-01 NOTE — Progress Notes (Signed)
? ?History of Present Illness: Here for E/M of BPH w/ sx's. ? ?He notes urinary stream changing within the past year.  He has nocturia x1 but he has slowing of his stream, straining at times, mild intermittency and occasional feeling of incomplete emptying.  He is not on any medical therapy. ? ?There is no family history of prostate cancer or BPH.  Most recent PSA was 0.9. ? ?Past Medical History:  ?Diagnosis Date  ? Diabetes mellitus without complication ([facility])   ? Hyperlipidemia   ? Hypertension   ? ? ?Past Surgical History:  ?Procedure Laterality Date  ? CORONARY ARTERY BYPASS GRAFT N/A 06/19/2021  ? Procedure: CORONARY ARTERY BYPASS GRAFTING TIMES 5;  Surgeon: Corliss Skains, MD;  [facility];  Service: Open Heart Surgery;  Laterality: N/A;  ? ENDOVEIN HARVEST OF GREATER SAPHENOUS VEIN Right 06/19/2021  ? Procedure: ENDOVEIN HARVEST OF GREATER SAPHENOUS VEIN;  Surgeon: Corliss Skains, MD;  Location: [facility];  Service: Open Heart Surgery;  Laterality: Right;  ? LEFT HEART CATH AND CORONARY ANGIOGRAPHY N/A 06/14/2021  ? Procedure: LEFT HEART CATH AND CORONARY ANGIOGRAPHY;  Surgeon: Tonny Bollman, MD;  Location: [facility];  Service: Cardiovascular;  Laterality: N/A;  ? RADIAL ARTERY HARVEST Left 06/19/2021  ? Procedure: RADIAL ARTERY HARVEST;  Surgeon: Corliss Skains, MD;  Location: [facility] OR;  Service: Open Heart Surgery;  Laterality: Left;  ? TEE WITHOUT CARDIOVERSION N/A 06/19/2021  ? Procedure: TRANSESOPHAGEAL ECHOCARDIOGRAM (TEE);  Surgeon: Corliss Skains, MD;  [facility];  Service: Open Heart Surgery;  Laterality: N/A;  ? ? ?Home Medications:  ?Allergies as of 01/02/2022   ? ?   Reactions  ? Ace Inhibitors Swelling, Other (See Comments)  ? Tongue swelling and cough.  ? ?  ? ?  ?Medication List  ?  ? ?  ? Accurate as of Jan 01, 2022  7:49 PM. If you have any questions, ask your nurse or doctor.  ?  ?  ? ?  ? ?acetaminophen 500 MG tablet ?Commonly known as: TYLENOL ?Take 2  tablets (1,000 mg total) by mouth every 6 (six) hours as needed. ?  ?aspirin 81 MG EC tablet ?Take 1 tablet by mouth daily. ?  ?atorvastatin 80 MG tablet ?Commonly known as: LIPITOR ?Take 1 tablet (80 mg total) by mouth daily. ?  ?clopidogrel 75 MG tablet ?Commonly known as: PLAVIX ?TAKE 1 TABLET BY MOUTH EVERY DAY ?  ?ezetimibe 10 MG tablet ?Commonly known as: ZETIA ?Take 1 tablet (10 mg total) by mouth daily. ?  ?Farxiga 10 MG Tabs tablet ?Generic drug: dapagliflozin propanediol ?TAKE 1 TABLET BY MOUTH EVERY DAY ?  ?losartan 100 MG tablet ?Commonly known as: COZAAR ?TAKE 1 TABLET BY MOUTH EVERY DAY ?  ?metFORMIN 500 MG 24 hr tablet ?Commonly known as: GLUCOPHAGE-XR ?Take 2 tablets (1,000 mg total) by mouth daily with breakfast. ?  ?metoprolol tartrate 25 MG tablet ?Commonly known as: LOPRESSOR ?TAKE 1 TABLET BY MOUTH TWICE A DAY ?  ?multivitamin with minerals tablet ?Take 2 tablets by mouth daily. ?  ?Semaglutide (1 MG/DOSE) 4 MG/3ML Sopn ?Inject 1 mg as directed once a week. ?  ? ?  ? ? ?Allergies:  ?Allergies  ?Allergen Reactions  ? Ace Inhibitors Swelling and Other (See Comments)  ?  Tongue swelling and cough. ?  ? ? ?Family History  ?Problem Relation Age of Onset  ? Hypertension Mother   ? Heart disease Sister   ? Heart attack  Maternal Grandfather   ? ? ?Social History:  reports that he has never smoked. He has never used smokeless tobacco. He reports that he does not drink alcohol and does not use drugs. ? ?ROS: ?A complete review of systems was performed.  All systems are negative except for pertinent findings as noted. ? ?Physical Exam:  ?Vital signs in last 24 hours: ?There were no vitals taken for this visit. ?Constitutional:  Alert and oriented, No acute distress ?Cardiovascular: Regular rate  ?Respiratory: Normal respiratory effort ?GI: Abdomen is soft, nontender, nondistended, no abdominal masses. No CVAT.  ?Genitourinary: Normal uncircumcised male phallus, testes are descended bilaterally and  non-tender and without masses, scrotum is normal in appearance without lesions or masses, perineum is normal on inspection.  Normal anal sphincter tone.  Prostate 40 g, symmetric, nonnodular nontender. ?Lymphatic: No lymphadenopathy ?Neurologic: Grossly intact, no focal deficits ?Psychiatric: Normal mood and affect ? ?I have reviewed prior pt notes ? ?I have reviewed notes from referring/previous physicians ? ?I have reviewed prior urinalysis results ? ?I have reviewed prior PSA results ? ?Bladder scan volume today 0 ? ? ? ?Impression/Assessment:  ?BPH with symptoms-weak stream.  Not on any medical therapy.  Normal PSA and benign exam ? ?Plan:  ?I discussed pharmacologic management with him.  I think that is a good way to start.  I will put him on alfuzosin. ? ?I will have him come back in a couple of months for recheck.  He will be moving to [facility] soon, so he will eventually need to transfer his urologic care to the [place] area. ? ?

## 2022-01-02 ENCOUNTER — Ambulatory Visit (INDEPENDENT_AMBULATORY_CARE_PROVIDER_SITE_OTHER): Payer: 59 | Admitting: Urology

## 2022-01-02 VITALS — HR 71 | Ht 67.0 in | Wt 163.0 lb

## 2022-01-02 DIAGNOSIS — N138 Other obstructive and reflux uropathy: Secondary | ICD-10-CM | POA: Diagnosis not present

## 2022-01-02 DIAGNOSIS — R3912 Poor urinary stream: Secondary | ICD-10-CM | POA: Diagnosis not present

## 2022-01-02 DIAGNOSIS — N401 Enlarged prostate with lower urinary tract symptoms: Secondary | ICD-10-CM

## 2022-01-02 MED ORDER — ALFUZOSIN HCL ER 10 MG PO TB24: 10.0000 mg | ORAL_TABLET | Freq: Every day | ORAL | 3 refills | Status: DC

## 2022-01-27 ENCOUNTER — Other Ambulatory Visit: Payer: Self-pay | Admitting: Family Medicine

## 2022-01-27 DIAGNOSIS — E1141 Type 2 diabetes mellitus with diabetic mononeuropathy: Secondary | ICD-10-CM

## 2022-02-13 ENCOUNTER — Ambulatory Visit: Payer: 59 | Admitting: Urology

## 2022-02-15 ENCOUNTER — Other Ambulatory Visit: Payer: Self-pay | Admitting: Family Medicine

## 2022-02-15 DIAGNOSIS — I10 Essential (primary) hypertension: Secondary | ICD-10-CM

## 2022-03-05 ENCOUNTER — Telehealth: Payer: Self-pay | Admitting: Family Medicine

## 2022-03-05 DIAGNOSIS — E1141 Type 2 diabetes mellitus with diabetic mononeuropathy: Secondary | ICD-10-CM

## 2022-03-05 MED ORDER — SEMAGLUTIDE (1 MG/DOSE) 4 MG/3ML ~~LOC~~ SOPN: 1.0000 mg | PEN_INJECTOR | SUBCUTANEOUS | 0 refills | Status: DC

## 2022-03-05 NOTE — Telephone Encounter (Signed)
  Prescription Request  03/05/2022  Is this a "Controlled Substance" medicine? no  Have you seen your PCP in the last 2 weeks? Appt 7/6  If YES, route message to pool  -  If NO, patient needs to be scheduled for appointment.  What is the name of the medication or equipment? Ozempic 1mg    Have you contacted your pharmacy to request a refill? Needs sent to different pharmacy    Which pharmacy would you like this sent to? [facility] [place]    Patient notified that their request is being sent to the clinical staff for review and that they should receive a response within 2 business days.

## 2022-03-07 ENCOUNTER — Ambulatory Visit: Payer: 59 | Admitting: Family Medicine

## 2022-03-08 ENCOUNTER — Encounter: Payer: Self-pay | Admitting: Family Medicine

## 2022-03-08 ENCOUNTER — Ambulatory Visit (INDEPENDENT_AMBULATORY_CARE_PROVIDER_SITE_OTHER): Payer: 59 | Admitting: Family Medicine

## 2022-03-08 VITALS — BP 140/86 | HR 94 | Temp 97.5°F | Ht 67.0 in | Wt 159.2 lb

## 2022-03-08 DIAGNOSIS — Z951 Presence of aortocoronary bypass graft: Secondary | ICD-10-CM

## 2022-03-08 DIAGNOSIS — E782 Mixed hyperlipidemia: Secondary | ICD-10-CM | POA: Diagnosis not present

## 2022-03-08 DIAGNOSIS — I10 Essential (primary) hypertension: Secondary | ICD-10-CM

## 2022-03-08 DIAGNOSIS — I252 Old myocardial infarction: Secondary | ICD-10-CM

## 2022-03-08 DIAGNOSIS — E1141 Type 2 diabetes mellitus with diabetic mononeuropathy: Secondary | ICD-10-CM

## 2022-03-08 LAB — BAYER DCA HB A1C WAIVED: HB A1C (BAYER DCA - WAIVED): 7.6 % — ABNORMAL HIGH (ref 4.8–5.6)

## 2022-03-08 MED ORDER — SEMAGLUTIDE (2 MG/DOSE) 8 MG/3ML ~~LOC~~ SOPN: 2.0000 mg | PEN_INJECTOR | SUBCUTANEOUS | 1 refills | Status: DC

## 2022-03-08 NOTE — Progress Notes (Signed)
Assessment & Plan:  1. Type 2 diabetes mellitus with diabetic mononeuropathy, without long-term current use of insulin (HCC) Lab Results  Component Value Date   HGBA1C 7.6 (H) 03/08/2022   HGBA1C 8.9 (H) 12/05/2021   HGBA1C 7.0 (H) 07/25/2021   - Diabetes is not at goal of A1c < 7, but is improving. - Medications: continue current medications, with an increase in Ozempic from 1 mg to 2 mg weekly. - Home glucose monitoring: continue monitoring - Patient is currently taking a statin. Patient is taking an ACE-inhibitor/ARB.  - Instruction/counseling given: discussed foot care  Diabetes Health Maintenance Due  Topic Date Due   HEMOGLOBIN A1C  06/06/2022   OPHTHALMOLOGY EXAM  10/20/2022   FOOT EXAM  03/09/2023    Lab Results  Component Value Date   LABMICR 4.8 07/25/2021   - CBC with Differential/Platelet - CMP14+EGFR - Lipid panel - Bayer DCA Hb A1c Waived - Semaglutide, 2 MG/DOSE, 8 MG/3ML SOPN; Inject 2 mg as directed once a week.  Dispense: 9 mL; Refill: 1  2. Essential hypertension Elevated today, but patient reports good readings at home. - CBC with Differential/Platelet - CMP14+EGFR - Lipid panel  3. Mixed hyperlipidemia Well controlled on current regimen.  - CBC with Differential/Platelet - CMP14+EGFR - Lipid panel  4-5. History of non-ST elevation myocardial infarction (NSTEMI)/S/P CABG x 5 Continue current regimen.    Return in about 3 months (around 06/08/2022) for follow-up of chronic medication conditions (may be virtual).  Hendricks Limes, MSN, APRN, FNP-C [facility]  Subjective:    Patient ID: Matthew Mann, male    DOB: Sep 27, 1965, 56 y.o.   MRN: [id number]  Patient Care Team: Loman Brooklyn, FNP as PCP - General (Family Medicine) O'Neal, Cassie Freer, MD as PCP - Cardiology (Cardiology) [facility] [place], Jossie Ng, NP as Nurse Practitioner (Cardiology)   Chief Complaint:  Chief Complaint  Patient presents with    Diabetes    3 month follow up - pt states medication has helped since the increase    Medical Management of Chronic Issues    HPI: Matthew Mann is a 56 y.o. male presenting on 03/08/2022 for Diabetes (3 month follow up - pt states medication has helped since the increase ) and Medical Management of Chronic Issues  Diabetes with hypertension and hyperlipidemia: Patient presents for follow up of diabetes. Current symptoms include: hyperglycemia. Known diabetic complications: cardiovascular disease. Medication compliance: taking Farxiga 10 mg QD, metformin 1,000 mg daily, and Ozempic 1 mg weekly. Current diet: in general, a "healthy" diet  . Current exercise: yard work . Home blood sugar records: BGs range between 130 and 150 . Is he  on ACE inhibitor or angiotensin II receptor blocker? Yes (Losartan). Is he on a statin? Yes (Atorvastatin).   Hypertension: patient does check his blood pressure at home and reports readings 120-130/70-85. States he is always higher at the doctor's office.   New complaints: None   Social history:  Relevant past medical, surgical, family and social history reviewed and updated as indicated. Interim medical history since our last visit reviewed.  Allergies and medications reviewed and updated.  DATA REVIEWED: CHART IN EPIC  ROS: Negative unless specifically indicated above in HPI.    Current Outpatient Medications:    acetaminophen (TYLENOL) 500 MG tablet, Take 2 tablets (1,000 mg total) by mouth every 6 (six) hours as needed., Disp: 30 tablet, Rfl: 0   alfuzosin (UROXATRAL) 10 MG 24 hr tablet, Take 1 tablet (  10 mg total) by mouth daily with breakfast., Disp: 90 tablet, Rfl: 3   aspirin 81 MG EC tablet, Take 1 tablet by mouth daily., Disp: , Rfl:    atorvastatin (LIPITOR) 80 MG tablet, Take 1 tablet (80 mg total) by mouth daily., Disp: 90 tablet, Rfl: 1   clopidogrel (PLAVIX) 75 MG tablet, TAKE 1 TABLET BY MOUTH EVERY DAY, Disp: 90 tablet, Rfl: 3   FARXIGA  10 MG TABS tablet, TAKE 1 TABLET BY MOUTH EVERY DAY, Disp: 90 tablet, Rfl: 0   losartan (COZAAR) 100 MG tablet, TAKE 1 TABLET BY MOUTH EVERY DAY, Disp: 90 tablet, Rfl: 0   metFORMIN (GLUCOPHAGE-XR) 500 MG 24 hr tablet, Take 2 tablets (1,000 mg total) by mouth daily with breakfast., Disp: 180 tablet, Rfl: 1   metoprolol tartrate (LOPRESSOR) 25 MG tablet, TAKE 1 TABLET BY MOUTH TWICE A DAY, Disp: 180 tablet, Rfl: 3   Multiple Vitamins-Minerals (MULTIVITAMIN WITH MINERALS) tablet, Take 2 tablets by mouth daily., Disp: , Rfl:    Semaglutide, 1 MG/DOSE, 4 MG/3ML SOPN, Inject 1 mg as directed once a week., Disp: 3 mL, Rfl: 0   ezetimibe (ZETIA) 10 MG tablet, Take 1 tablet (10 mg total) by mouth daily., Disp: 90 tablet, Rfl: 3   Allergies  Allergen Reactions   Ace Inhibitors Swelling and Other (See Comments)    Tongue swelling and cough.    Past Medical History:  Diagnosis Date   Diabetes mellitus without complication ([place])    Hyperlipidemia    Hypertension     Past Surgical History:  Procedure Laterality Date   CORONARY ARTERY BYPASS GRAFT N/A 06/19/2021   Procedure: CORONARY ARTERY BYPASS GRAFTING TIMES 5;  Surgeon: Lajuana Matte, MD;  Location: [place];  Service: Open Heart Surgery;  Laterality: N/A;   ENDOVEIN HARVEST OF GREATER SAPHENOUS VEIN Right 06/19/2021   Procedure: ENDOVEIN HARVEST OF GREATER SAPHENOUS VEIN;  Surgeon: Lajuana Matte, MD;  Location: [place];  Service: Open Heart Surgery;  Laterality: Right;   LEFT HEART CATH AND CORONARY ANGIOGRAPHY N/A 06/14/2021   Procedure: LEFT HEART CATH AND CORONARY ANGIOGRAPHY;  Surgeon: Sherren Mocha, MD;  Location: [place] [facility];  Service: Cardiovascular;  Laterality: N/A;   RADIAL ARTERY HARVEST Left 06/19/2021   Procedure: RADIAL ARTERY HARVEST;  Surgeon: Lajuana Matte, MD;  Location: [place];  Service: Open Heart Surgery;  Laterality: Left;   TEE WITHOUT CARDIOVERSION N/A 06/19/2021   Procedure: TRANSESOPHAGEAL  ECHOCARDIOGRAM (TEE);  Surgeon: Lajuana Matte, MD;  Location: [place];  Service: Open Heart Surgery;  Laterality: N/A;    Social History   Socioeconomic History   Marital status: Married    Spouse name: Not on file   Number of children: Not on file   Years of education: Not on file   Highest education level: Not on file  Occupational History   Not on file  Tobacco Use   Smoking status: Never   Smokeless tobacco: Never  Vaping Use   Vaping Use: Never used  Substance and Sexual Activity   Alcohol use: Never   Drug use: Never   Sexual activity: Yes  Other Topics Concern   Not on file  Social History Narrative   Not on file   Social Determinants of Health   Financial Resource Strain: Not on file  Food Insecurity: Not on file  Transportation Needs: Not on file  Physical Activity: Not on file  Stress: Not on file  Social Connections: Not on file  Intimate Partner Violence: Not on file        Objective:    BP 140/86   Pulse 94   Temp (!) 97.5 F (36.4 C) (Temporal)   Ht '5\' 7"'  (1.702 m)   Wt 159 lb 3.2 oz (72.2 kg)   SpO2 99%   BMI 24.93 kg/m   Wt Readings from Last 3 Encounters:  03/08/22 159 lb 3.2 oz (72.2 kg)  01/02/22 163 lb (73.9 kg)  12/05/21 163 lb 3.2 oz (74 kg)    Physical Exam Vitals reviewed.  Constitutional:      General: He is not in acute distress.    Appearance: Normal appearance. He is overweight. He is not ill-appearing, toxic-appearing or diaphoretic.  HENT:     Head: Normocephalic and atraumatic.  Eyes:     General: No scleral icterus.       Right eye: No discharge.        Left eye: No discharge.     Conjunctiva/sclera: Conjunctivae normal.  Cardiovascular:     Rate and Rhythm: Normal rate and regular rhythm.     Heart sounds: Normal heart sounds. No murmur heard.    No friction rub. No gallop.  Pulmonary:     Effort: Pulmonary effort is normal. No respiratory distress.     Breath sounds: Normal breath sounds. No stridor. No  wheezing, rhonchi or rales.  Musculoskeletal:        General: Normal range of motion.     Cervical back: Normal range of motion.  Skin:    General: Skin is warm and dry.  Neurological:     Mental Status: He is alert and oriented to person, place, and time. Mental status is at baseline.  Psychiatric:        Mood and Affect: Mood normal.        Behavior: Behavior normal.        Thought Content: Thought content normal.        Judgment: Judgment normal.     No results found for: "TSH" Lab Results  Component Value Date   WBC 8.2 12/06/2021   HGB 15.6 12/06/2021   HCT 46.1 12/06/2021   MCV 88 12/06/2021   PLT 248 12/06/2021   Lab Results  Component Value Date   NA 137 12/06/2021   K 4.2 12/06/2021   CO2 22 12/06/2021   GLUCOSE 150 (H) 12/06/2021   BUN 19 12/06/2021   CREATININE 1.13 12/06/2021   BILITOT 1.1 12/06/2021   ALKPHOS 73 12/06/2021   AST 24 12/06/2021   ALT 33 12/06/2021   PROT 6.7 12/06/2021   ALBUMIN 4.5 12/06/2021   CALCIUM 9.8 12/06/2021   ANIONGAP 15 09/03/2021   EGFR 77 12/06/2021   Lab Results  Component Value Date   CHOL 72 (L) 12/06/2021   Lab Results  Component Value Date   HDL 28 (L) 12/06/2021   Lab Results  Component Value Date   LDLCALC 26 12/06/2021   Lab Results  Component Value Date   TRIG 90 12/06/2021   Lab Results  Component Value Date   CHOLHDL 2.6 12/06/2021   Lab Results  Component Value Date   HGBA1C 8.9 (H) 12/05/2021

## 2022-03-09 LAB — CMP14+EGFR
ALT: 25 IU/L (ref 0–44)
AST: 19 IU/L (ref 0–40)
Albumin/Globulin Ratio: 1.9 (ref 1.2–2.2)
Albumin: 4.6 g/dL (ref 3.8–4.9)
Alkaline Phosphatase: 75 IU/L (ref 44–121)
BUN/Creatinine Ratio: 15 (ref 9–20)
BUN: 15 mg/dL (ref 6–24)
Bilirubin Total: 1.1 mg/dL (ref 0.0–1.2)
CO2: 23 mmol/L (ref 20–29)
Calcium: 9.7 mg/dL (ref 8.7–10.2)
Chloride: 103 mmol/L (ref 96–106)
Creatinine, Ser: 1.03 mg/dL (ref 0.76–1.27)
Globulin, Total: 2.4 g/dL (ref 1.5–4.5)
Glucose: 260 mg/dL — ABNORMAL HIGH (ref 70–99)
Potassium: 4.4 mmol/L (ref 3.5–5.2)
Sodium: 142 mmol/L (ref 134–144)
Total Protein: 7 g/dL (ref 6.0–8.5)
eGFR: 86 mL/min/{1.73_m2} (ref >59)

## 2022-03-09 LAB — LIPID PANEL
Chol/HDL Ratio: 2.2 ratio (ref 0.0–5.0)
Cholesterol, Total: 68 mg/dL — ABNORMAL LOW (ref 100–199)
HDL: 31 mg/dL — ABNORMAL LOW (ref >39)
LDL Chol Calc (NIH): 16 mg/dL (ref 0–99)
Triglycerides: 111 mg/dL (ref 0–149)
VLDL Cholesterol Cal: 21 mg/dL (ref 5–40)

## 2022-03-09 LAB — CBC WITH DIFFERENTIAL/PLATELET
Basophils Absolute: 0 10*3/uL (ref 0.0–0.2)
Basos: 0 %
EOS (ABSOLUTE): 0.1 10*3/uL (ref 0.0–0.4)
Eos: 1 %
Hematocrit: 46 % (ref 37.5–51.0)
Hemoglobin: 15.2 g/dL (ref 13.0–17.7)
Immature Grans (Abs): 0 10*3/uL (ref 0.0–0.1)
Immature Granulocytes: 0 %
Lymphocytes Absolute: 1.8 10*3/uL (ref 0.7–3.1)
Lymphs: 17 %
MCH: 29.5 pg (ref 26.6–33.0)
MCHC: 33 g/dL (ref 31.5–35.7)
MCV: 89 fL (ref 79–97)
Monocytes Absolute: 0.6 10*3/uL (ref 0.1–0.9)
Monocytes: 6 %
Neutrophils Absolute: 8 10*3/uL — ABNORMAL HIGH (ref 1.4–7.0)
Neutrophils: 76 %
Platelets: 269 10*3/uL (ref 150–450)
RBC: 5.16 x10E6/uL (ref 4.14–5.80)
RDW: 12.7 % (ref 11.6–15.4)
WBC: 10.6 10*3/uL (ref 3.4–10.8)

## 2022-03-13 ENCOUNTER — Telehealth: Payer: Self-pay | Admitting: Family Medicine

## 2022-03-13 ENCOUNTER — Ambulatory Visit: Payer: 59 | Admitting: Urology

## 2022-03-13 DIAGNOSIS — E1141 Type 2 diabetes mellitus with diabetic mononeuropathy: Secondary | ICD-10-CM

## 2022-03-13 MED ORDER — SEMAGLUTIDE (2 MG/DOSE) 8 MG/3ML ~~LOC~~ SOPN: 2.0000 mg | PEN_INJECTOR | SUBCUTANEOUS | 1 refills | Status: DC

## 2022-03-13 NOTE — Telephone Encounter (Signed)
Refills sent. Pt informed.  

## 2022-03-15 ENCOUNTER — Other Ambulatory Visit: Payer: Self-pay | Admitting: Family Medicine

## 2022-03-15 DIAGNOSIS — E782 Mixed hyperlipidemia: Secondary | ICD-10-CM

## 2022-03-15 DIAGNOSIS — E1141 Type 2 diabetes mellitus with diabetic mononeuropathy: Secondary | ICD-10-CM

## 2022-03-21 ENCOUNTER — Encounter: Payer: Self-pay | Admitting: Family Medicine

## 2022-03-28 ENCOUNTER — Encounter: Payer: Self-pay | Admitting: Family Medicine

## 2022-03-29 MED ORDER — SEMAGLUTIDE (1 MG/DOSE) 4 MG/3ML ~~LOC~~ SOPN: 1.0000 mg | PEN_INJECTOR | SUBCUTANEOUS | 0 refills | Status: DC

## 2022-03-30 ENCOUNTER — Telehealth: Payer: Self-pay | Admitting: Family Medicine

## 2022-03-30 MED ORDER — SEMAGLUTIDE (1 MG/DOSE) 4 MG/3ML ~~LOC~~ SOPN
1.0000 mg | PEN_INJECTOR | SUBCUTANEOUS | 0 refills | Status: DC
Start: 2022-03-30 — End: 2022-06-04

## 2022-03-30 NOTE — Telephone Encounter (Signed)
Rx sent left detailed message 

## 2022-04-09 ENCOUNTER — Encounter: Payer: Self-pay | Admitting: *Deleted

## 2022-04-20 IMAGING — DX DG CHEST 2V
2 series · 2 of 2 positions shown · non-contrast
Comparison: 07/24/2021.

CLINICAL DATA: Chest pain.  Bypass surgery in June 2021.

EXAM:
CHEST - 2 VIEW

[chest pa]
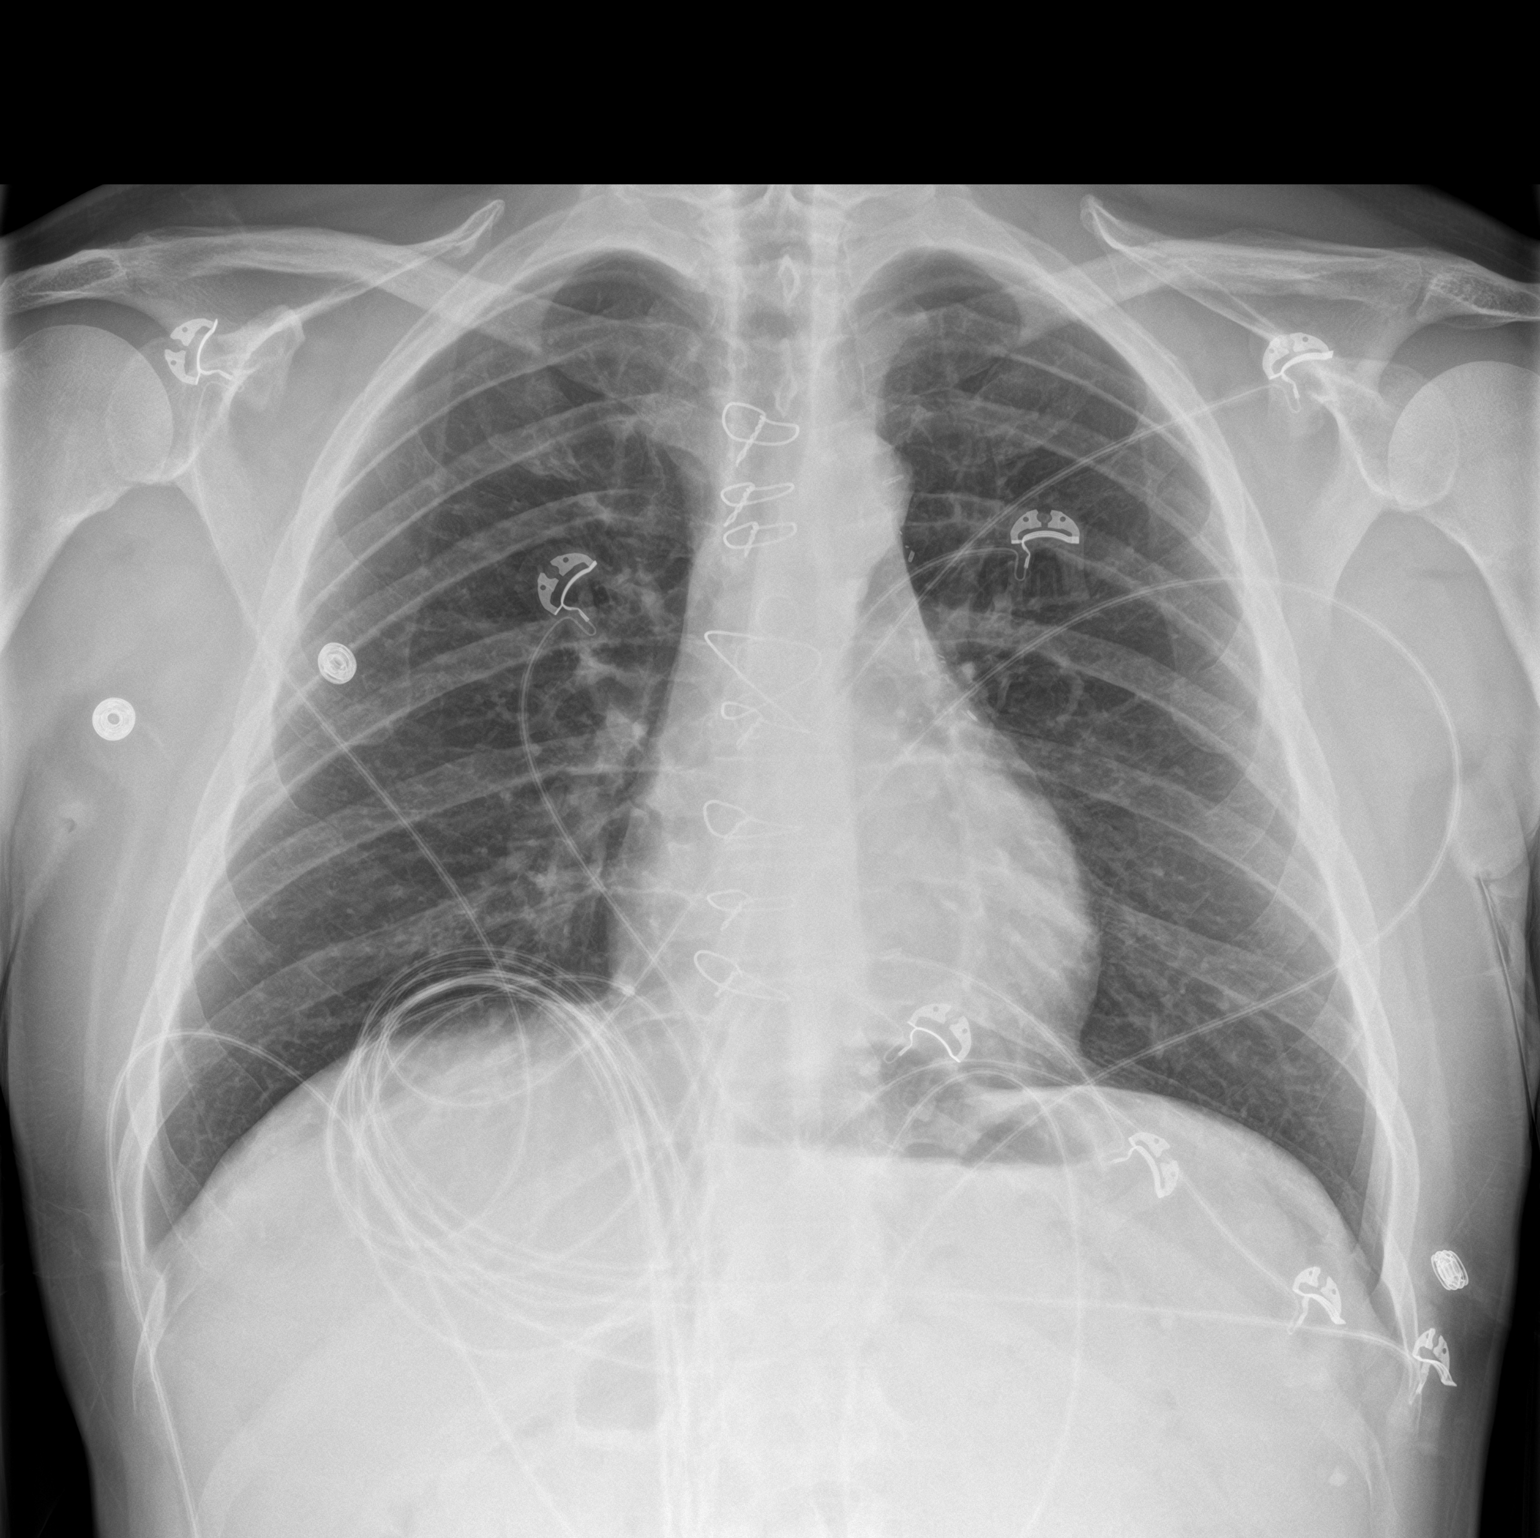

[chest lat]
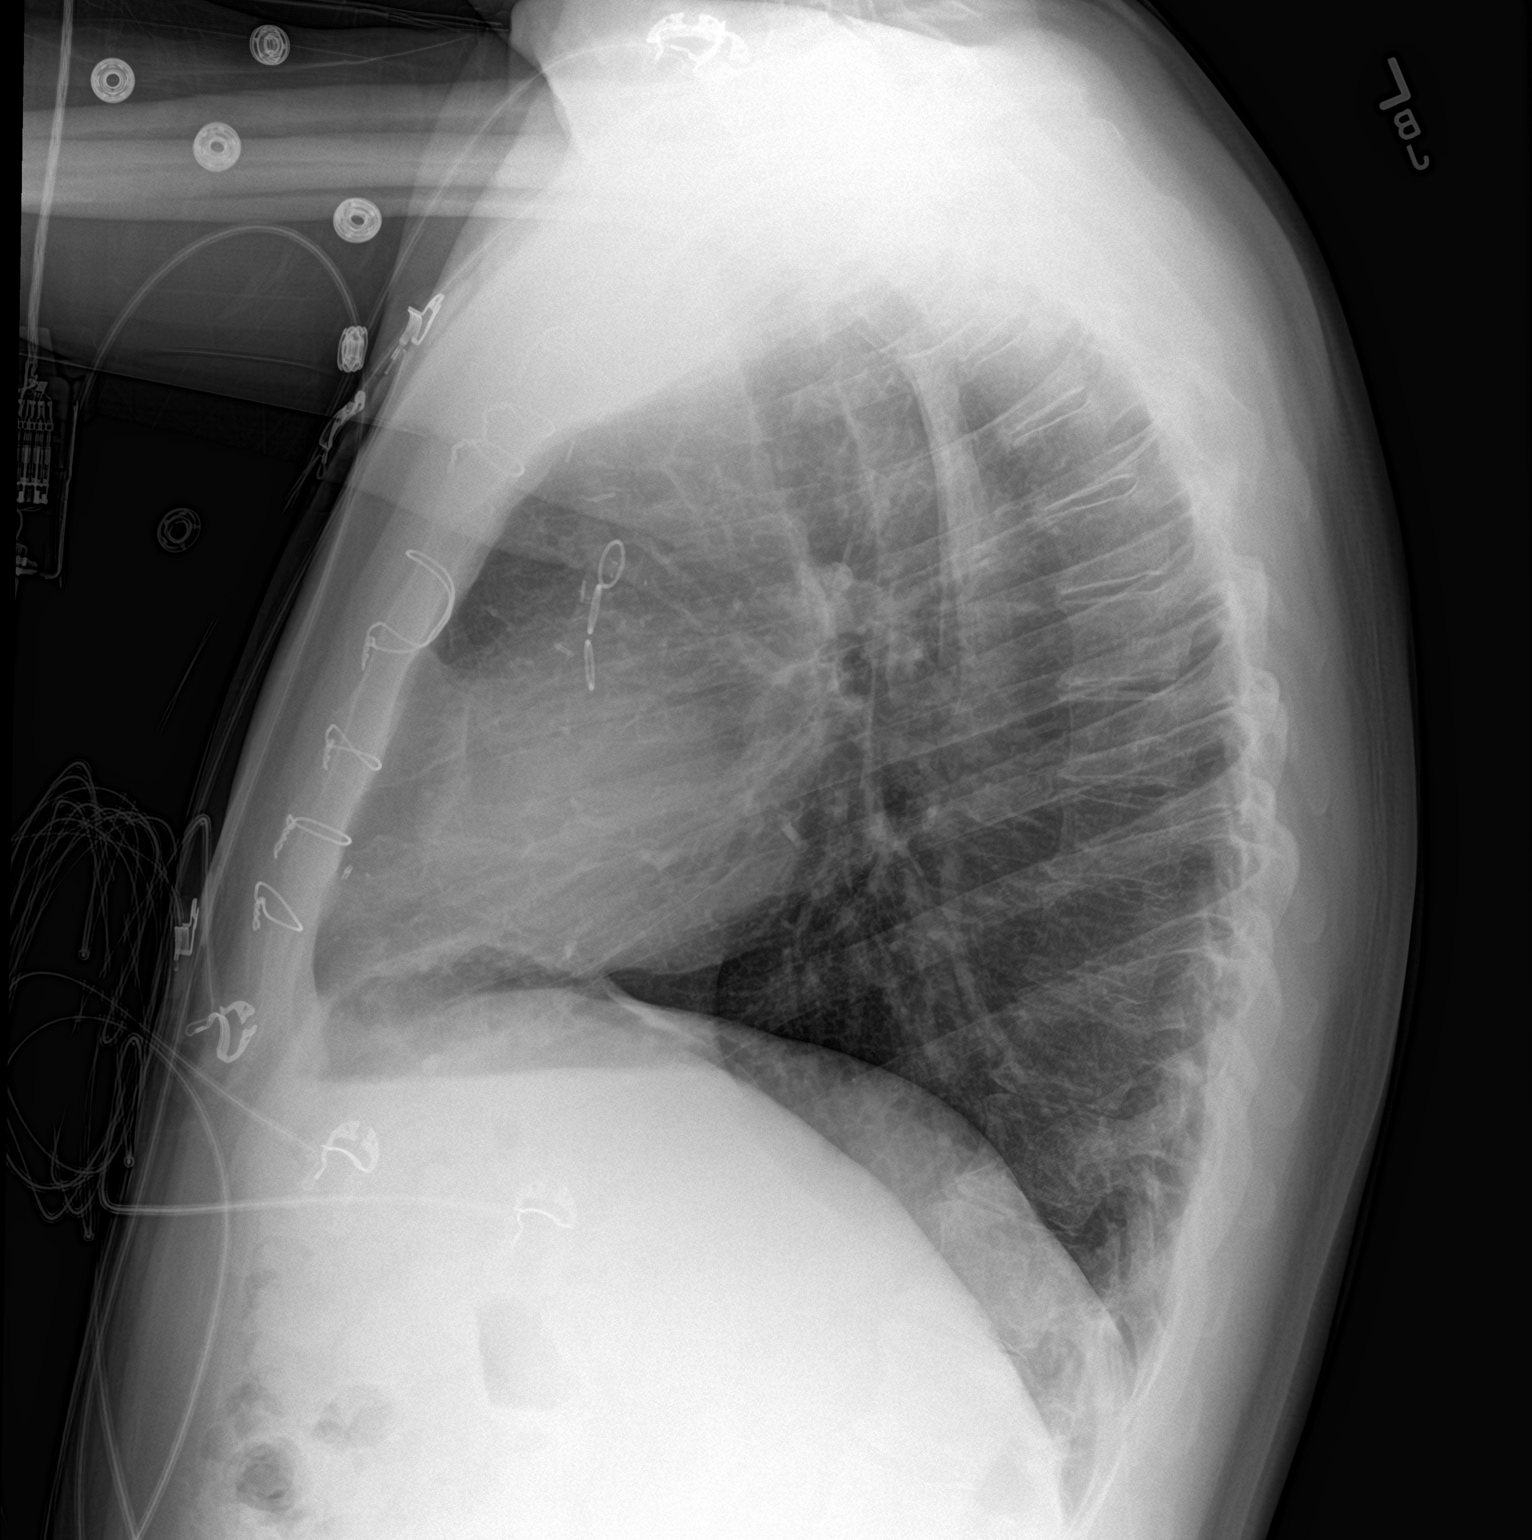

[2 of 2 positions shown; findings below may reference images not displayed]

FINDINGS: Stable changes from prior CABG surgery. Cardiac silhouette is normal
in size. Normal mediastinal and hilar contours.

Clear lungs.  No pleural effusion or pneumothorax.

Skeletal structures are intact.
IMPRESSION: No active cardiopulmonary disease.

## 2022-04-20 IMAGING — CT CT ANGIO CHEST
2 of 6 series · 18 of 46 positions shown · IV contrast (Omnipaque or Isovue)
Comparison: 06/14/2021

CLINICAL DATA: Chest pain since [REDACTED].

EXAM:
CT ANGIOGRAPHY CHEST WITH CONTRAST
TECHNIQUE: Multidetector CT imaging of the chest was performed using the
standard protocol during bolus administration of intravenous
contrast. Multiplanar CT image reconstructions and MIPs were
obtained to evaluate the vascular anatomy.
CONTRAST:  75mL OMNIPAQUE IOHEXOL 350 MG/ML SOLN

[Series 5: pe axial thins · axial · 0.70mm/px · z∈[+1075,+1341]mm · 15 of 365 slices shown]
[im 16/365  lung]
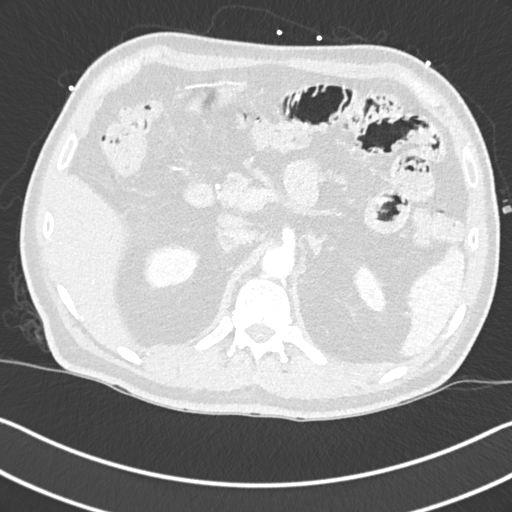
[im 46/365  soft-tissue]
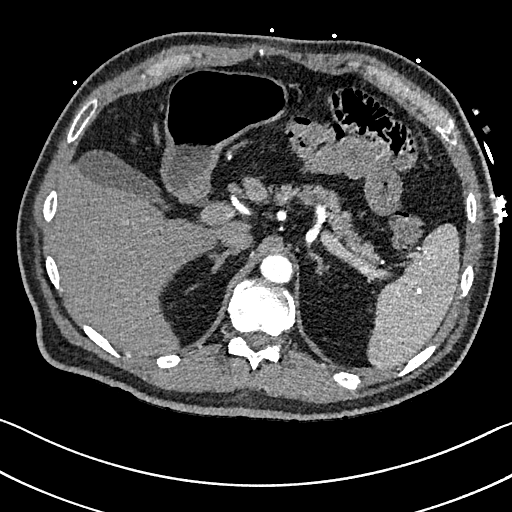
[im 61/365  lung]
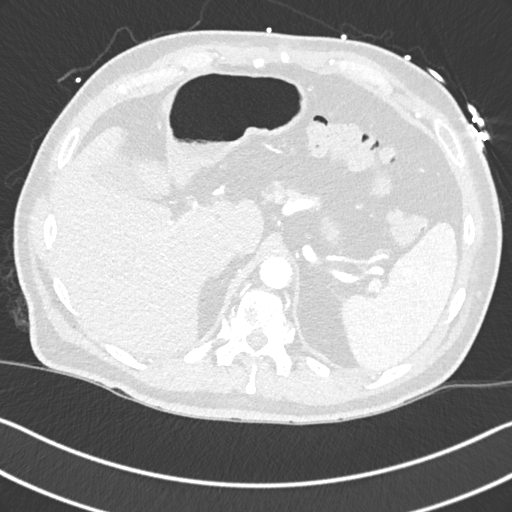
[im 92/365  soft-tissue]
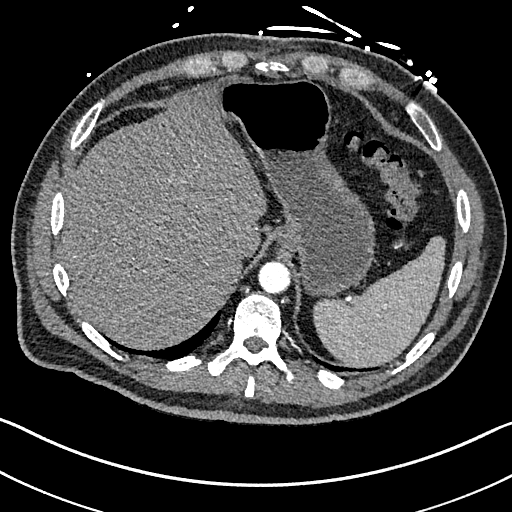
[im 107/365  lung]
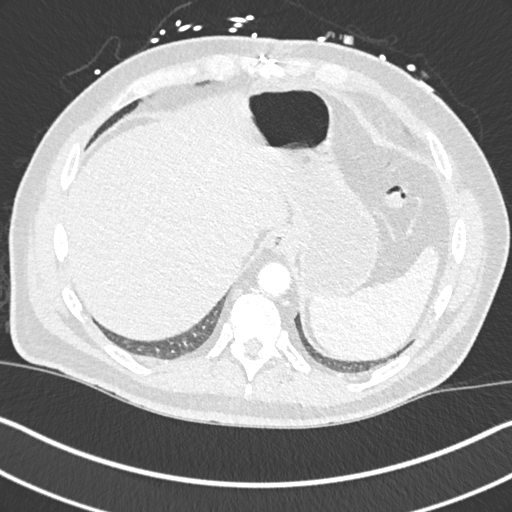
[im 137/365  soft-tissue]
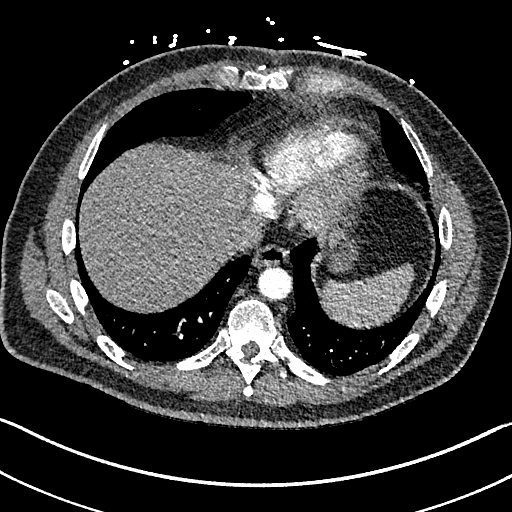
[im 152/365  lung]
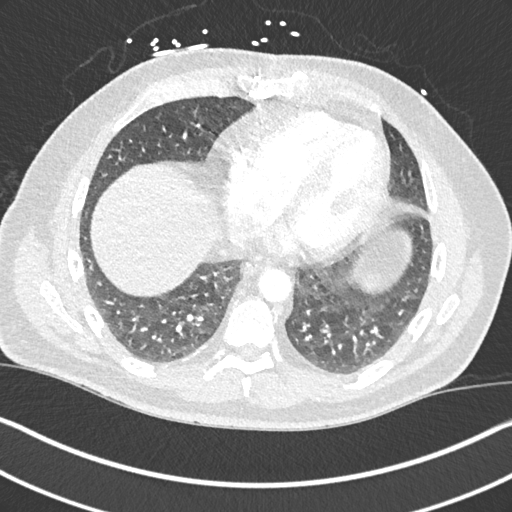
[im 183/365  soft-tissue]
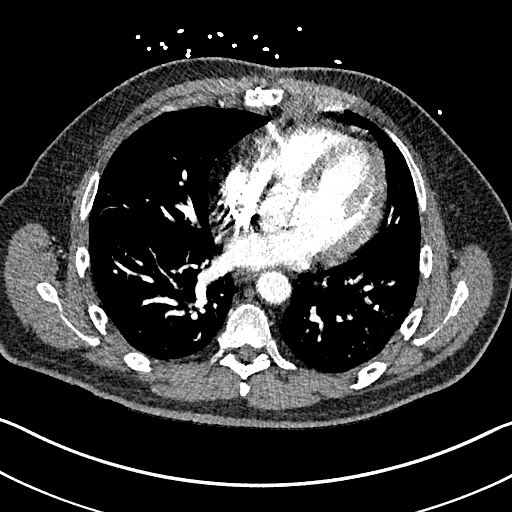
[im 213/365  lung]
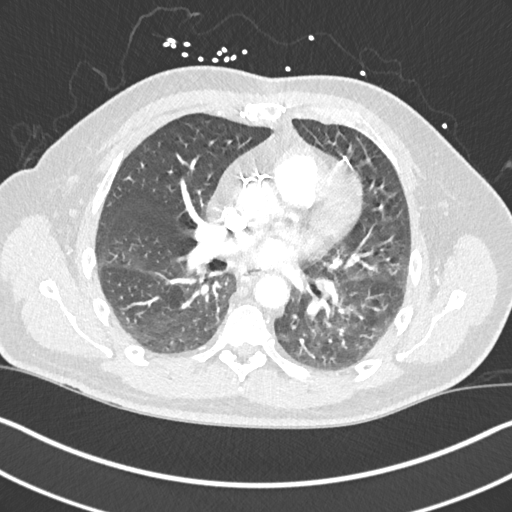
[im 228/365  soft-tissue]
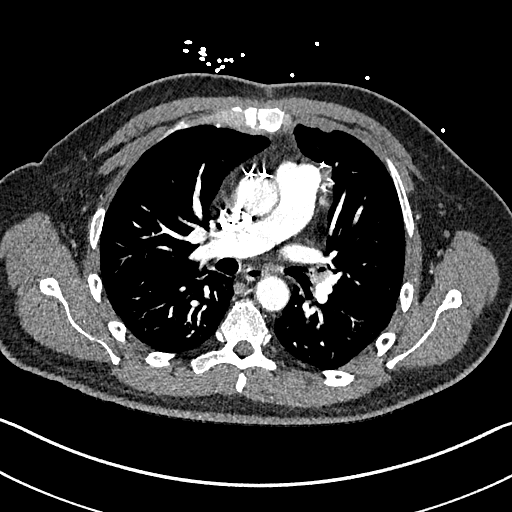
[im 258/365  lung]
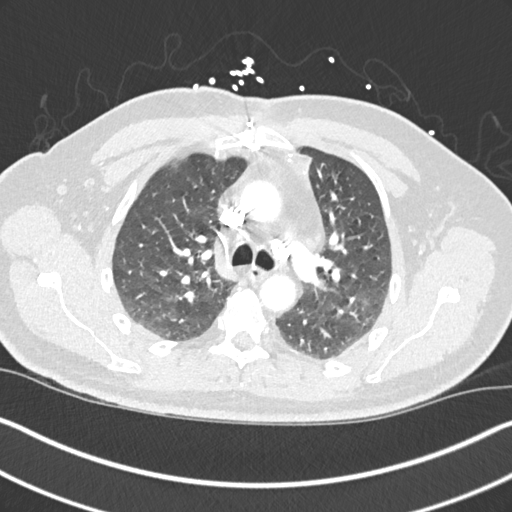
[im 274/365  soft-tissue]
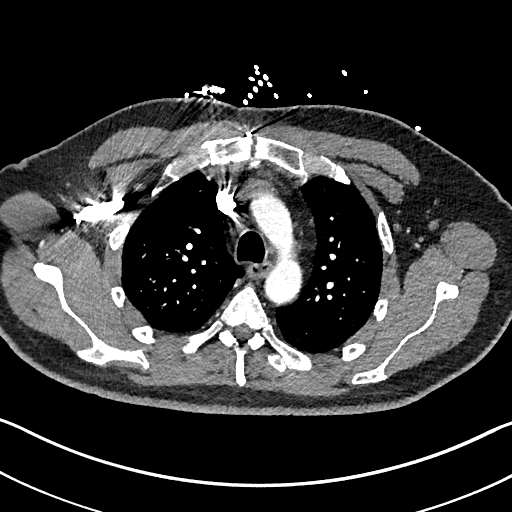
[im 304/365  lung]
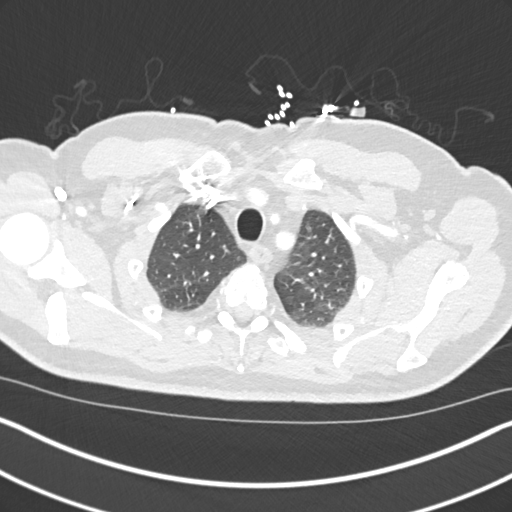
[im 319/365  soft-tissue]
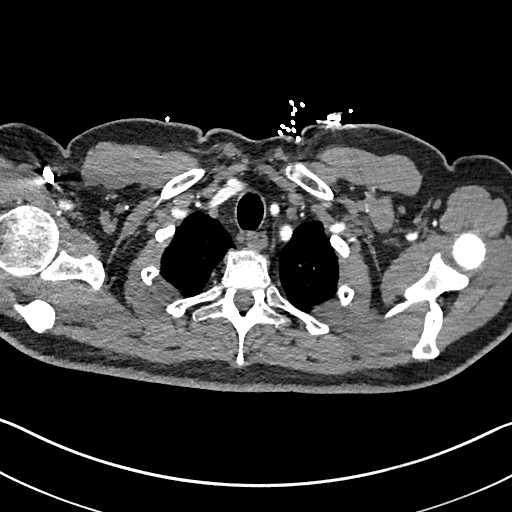
[im 349/365  lung]
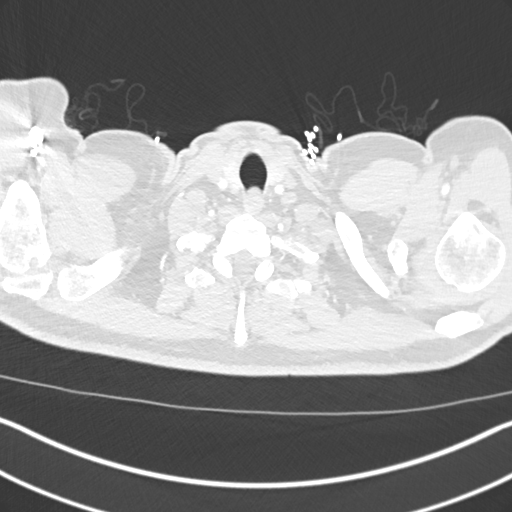

[Series 8: cor soft · coronal · 0.69mm/px · 3 of 172 slices shown]
[im 43/172  soft-tissue]
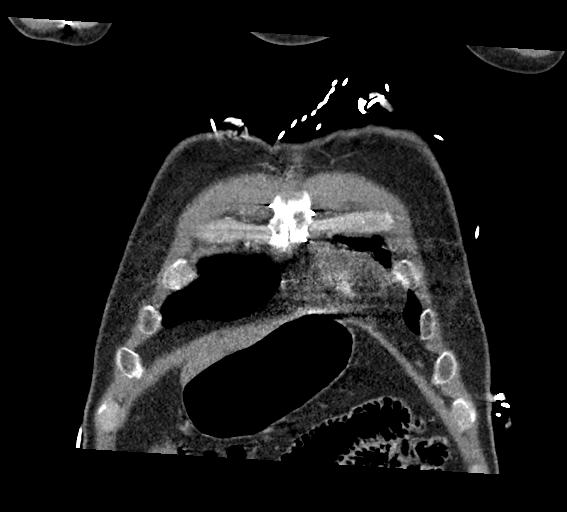
[im 86/172  soft-tissue]
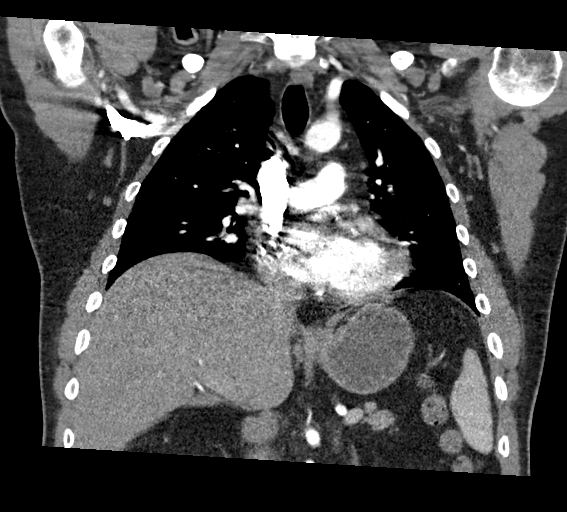
[im 129/172  soft-tissue]
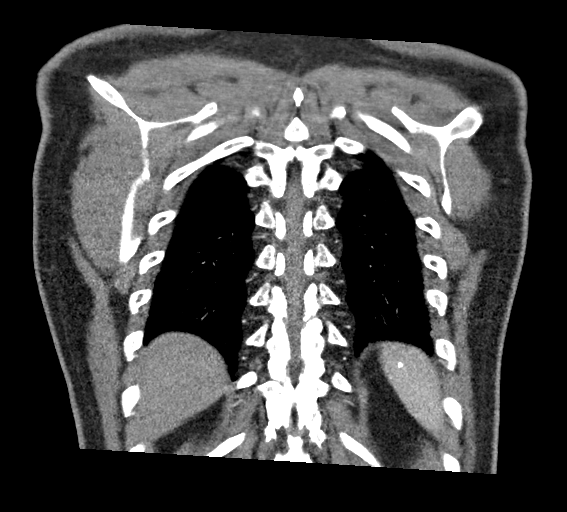

[18 of 46 positions shown; findings below may reference images not displayed]

FINDINGS: Cardiovascular: The heart is normal in size. No pericardial
effusion. The aorta is normal in caliber. No dissection. No
atherosclerotic calcifications. There are surgical changes from
coronary artery bypass surgery.

The pulmonary arterial tree is fairly well opacified. No filling
defects to suggest pulmonary embolism.

Mediastinum/Nodes: Stable calcified left AP window nodes. No
mediastinal or hilar mass or adenopathy. The esophagus is
unremarkable.

Lungs/Pleura: Mild peribronchial thickening and increased
interstitial markings predominantly in the lower lung zones
suggesting bronchitis or possibly interstitial pneumonitis. No focal
airspace consolidation to suggest typical pneumonia. No pleural
effusions. No pulmonary lesions.

Upper Abdomen: No significant upper abdominal findings. Stable
splenic calcified granulomas.

Musculoskeletal: The median sternotomy is ununited. There is a
lucency in the T11 vertebral body on the right side unchanged since
prior study from 06/14/2021. This could be a benign hemangioma but
is indeterminate. I do not see any other definite bone lesions.
Recommend MRI thoracic spine for further evaluation/better
characterization.

Review of the MIP images confirms the above findings.
IMPRESSION: 1. No CT findings for pulmonary embolism.
2. Normal thoracic aorta.
3. Surgical changes from coronary artery bypass surgery.
4. Mild peribronchial thickening and increased interstitial markings
predominantly in the lower lung zones suggesting bronchitis or
possibly interstitial pneumonitis. No focal airspace consolidation
to suggest typical pneumonia.
5. Stable calcified left AP window nodes and splenic granulomas.
6. Stable T11 vertebral body lucency. Recommend MRI thoracic spine
for further evaluation.

## 2022-04-23 ENCOUNTER — Encounter: Payer: Self-pay | Admitting: Family Medicine

## 2022-04-23 DIAGNOSIS — E1141 Type 2 diabetes mellitus with diabetic mononeuropathy: Secondary | ICD-10-CM

## 2022-04-23 MED ORDER — DAPAGLIFLOZIN PROPANEDIOL 10 MG PO TABS: 10.0000 mg | ORAL_TABLET | Freq: Every day | ORAL | 0 refills | Status: DC

## 2022-04-28 ENCOUNTER — Other Ambulatory Visit: Payer: Self-pay | Admitting: Family Medicine

## 2022-04-28 DIAGNOSIS — E1141 Type 2 diabetes mellitus with diabetic mononeuropathy: Secondary | ICD-10-CM

## 2022-05-18 ENCOUNTER — Other Ambulatory Visit: Payer: Self-pay | Admitting: General Practice

## 2022-05-18 DIAGNOSIS — E782 Mixed hyperlipidemia: Secondary | ICD-10-CM

## 2022-06-04 ENCOUNTER — Other Ambulatory Visit: Payer: Self-pay | Admitting: *Deleted

## 2022-06-04 MED ORDER — SEMAGLUTIDE (1 MG/DOSE) 4 MG/3ML ~~LOC~~ SOPN: 1.0000 mg | PEN_INJECTOR | SUBCUTANEOUS | 0 refills | Status: DC

## 2022-06-08 ENCOUNTER — Encounter: Payer: Self-pay | Admitting: Family Medicine

## 2022-06-08 ENCOUNTER — Ambulatory Visit: Payer: 59 | Admitting: Family Medicine

## 2022-06-08 ENCOUNTER — Ambulatory Visit (INDEPENDENT_AMBULATORY_CARE_PROVIDER_SITE_OTHER): Payer: 59 | Admitting: Family Medicine

## 2022-06-08 VITALS — BP 161/93 | HR 80 | Temp 98.5°F | Ht 67.0 in | Wt 153.0 lb

## 2022-06-08 DIAGNOSIS — E1159 Type 2 diabetes mellitus with other circulatory complications: Secondary | ICD-10-CM | POA: Diagnosis not present

## 2022-06-08 DIAGNOSIS — E1141 Type 2 diabetes mellitus with diabetic mononeuropathy: Secondary | ICD-10-CM | POA: Diagnosis not present

## 2022-06-08 DIAGNOSIS — E785 Hyperlipidemia, unspecified: Secondary | ICD-10-CM

## 2022-06-08 DIAGNOSIS — E1169 Type 2 diabetes mellitus with other specified complication: Secondary | ICD-10-CM | POA: Diagnosis not present

## 2022-06-08 DIAGNOSIS — I152 Hypertension secondary to endocrine disorders: Secondary | ICD-10-CM

## 2022-06-08 LAB — BAYER DCA HB A1C WAIVED: HB A1C (BAYER DCA - WAIVED): 8.4 % — ABNORMAL HIGH (ref 4.8–5.6)

## 2022-06-08 MED ORDER — ATORVASTATIN CALCIUM 80 MG PO TABS: 80.0000 mg | ORAL_TABLET | Freq: Every day | ORAL | 3 refills | Status: DC

## 2022-06-08 MED ORDER — SEMAGLUTIDE (2 MG/DOSE) 8 MG/3ML ~~LOC~~ SOPN: 2.0000 mg | PEN_INJECTOR | SUBCUTANEOUS | 3 refills | Status: DC

## 2022-06-08 NOTE — Patient Instructions (Signed)

## 2022-06-08 NOTE — Progress Notes (Signed)
Subjective:  Patient ID: Matthew Mann, male    DOB: [birth year], 56 y.o.   MRN: [id number]  Patient Care Team: Loman Brooklyn, FNP as PCP - General (Family Medicine) O'Neal, Cassie Freer, MD as PCP - Cardiology (Cardiology) [facility] Deberah Pelton, NP as Nurse Practitioner (Cardiology)   Chief Complaint:  Medical Management of Chronic Issues   HPI: Matthew Mann is a 56 y.o. male presenting on 06/08/2022 for Medical Management of Chronic Issues    1. Type 2 diabetes mellitus with diabetic mononeuropathy, without long-term current use of insulin (HCC) Was supposed to increase once weekly Ozempic dosing to 2 mg, this was not available at pharmacy so he has continued 1 mg weekly. Does report high readings at times, as 193 this morning. Denies polyuria, polydipsia, or polyphagia.   2. Hyperlipidemia associated with type 2 diabetes mellitus ([place]) On statin therapy and tolerating well. Last lipid panel at goal. No reported myalgias. Does try to follow a healthy diet and is active daily.   3. Hypertension associated with diabetes ([place]) Compliant with medications without associated side effects. Denies chest pain, palpitations, leg swelling, visual changes, headaches, weakness, or confusion.     Relevant past medical, surgical, family, and social history reviewed and updated as indicated.  Allergies and medications reviewed and updated. Data reviewed: Chart in Epic.   Past Medical History:  Diagnosis Date   Diabetes mellitus without complication ([place])    Hyperlipidemia    Hypertension     Past Surgical History:  Procedure Laterality Date   CORONARY ARTERY BYPASS GRAFT N/A 06/19/2021   Procedure: CORONARY ARTERY BYPASS GRAFTING TIMES 5;  Surgeon: Lajuana Matte, MD;  Location: [place];  Service: Open Heart Surgery;  Laterality: N/A;   ENDOVEIN HARVEST OF GREATER SAPHENOUS VEIN Right 06/19/2021   Procedure: ENDOVEIN HARVEST OF GREATER SAPHENOUS VEIN;  Surgeon:  Lajuana Matte, MD;  Location: [place];  Service: Open Heart Surgery;  Laterality: Right;   LEFT HEART CATH AND CORONARY ANGIOGRAPHY N/A 06/14/2021   Procedure: LEFT HEART CATH AND CORONARY ANGIOGRAPHY;  Surgeon: Sherren Mocha, MD;  Location: [place] [facility];  Service: Cardiovascular;  Laterality: N/A;   RADIAL ARTERY HARVEST Left 06/19/2021   Procedure: RADIAL ARTERY HARVEST;  Surgeon: Lajuana Matte, MD;  Location: [place];  Service: Open Heart Surgery;  Laterality: Left;   TEE WITHOUT CARDIOVERSION N/A 06/19/2021   Procedure: TRANSESOPHAGEAL ECHOCARDIOGRAM (TEE);  Surgeon: Lajuana Matte, MD;  Location: [place];  Service: Open Heart Surgery;  Laterality: N/A;    Social History   Socioeconomic History   Marital status: Married    Spouse name: Not on file   Number of children: Not on file   Years of education: Not on file   Highest education level: Not on file  Occupational History   Not on file  Tobacco Use   Smoking status: Never   Smokeless tobacco: Never  Vaping Use   Vaping Use: Never used  Substance and Sexual Activity   Alcohol use: Never   Drug use: Never   Sexual activity: Yes  Other Topics Concern   Not on file  Social History Narrative   Not on file   Social Determinants of Health   Financial Resource Strain: Not on file  Food Insecurity: Not on file  Transportation Needs: Not on file  Physical Activity: Not on file  Stress: Not on file  Social Connections: Not on file  Intimate Partner Violence: Not  on file    Outpatient Encounter Medications as of 06/08/2022  Medication Sig   aspirin 81 MG EC tablet Take 1 tablet by mouth daily.   clopidogrel (PLAVIX) 75 MG tablet TAKE 1 TABLET BY MOUTH EVERY DAY   dapagliflozin propanediol (FARXIGA) 10 MG TABS tablet Take 1 tablet (10 mg total) by mouth daily.   ezetimibe (ZETIA) 10 MG tablet TAKE 1 TABLET BY MOUTH EVERY DAY   losartan (COZAAR) 100 MG tablet TAKE 1 TABLET BY MOUTH EVERY DAY   metFORMIN  (GLUCOPHAGE-XR) 500 MG 24 hr tablet Take 2 tablets (1,000 mg total) by mouth daily with breakfast.   metoprolol tartrate (LOPRESSOR) 25 MG tablet TAKE 1 TABLET BY MOUTH TWICE A DAY   Multiple Vitamins-Minerals (MULTIVITAMIN WITH MINERALS) tablet Take 2 tablets by mouth daily.   [DISCONTINUED] atorvastatin (LIPITOR) 80 MG tablet TAKE 1 TABLET BY MOUTH EVERY DAY   [DISCONTINUED] Semaglutide, 1 MG/DOSE, 4 MG/3ML SOPN Inject 1 mg as directed once a week.   [DISCONTINUED] Semaglutide, 2 MG/DOSE, 8 MG/3ML SOPN Inject 2 mg as directed once a week.   atorvastatin (LIPITOR) 80 MG tablet Take 1 tablet (80 mg total) by mouth daily.   Semaglutide, 2 MG/DOSE, 8 MG/3ML SOPN Inject 2 mg as directed once a week.   [DISCONTINUED] acetaminophen (TYLENOL) 500 MG tablet Take 2 tablets (1,000 mg total) by mouth every 6 (six) hours as needed.   [DISCONTINUED] alfuzosin (UROXATRAL) 10 MG 24 hr tablet Take 1 tablet (10 mg total) by mouth daily with breakfast.   No facility-administered encounter medications on file as of 06/08/2022.    Allergies  Allergen Reactions   Ace Inhibitors Swelling and Other (See Comments)    Tongue swelling and cough.     Review of Systems  Constitutional:  Negative for activity change, appetite change, chills, diaphoresis, fatigue, fever and unexpected weight change.  HENT: Negative.    Eyes: Negative.  Negative for photophobia and visual disturbance.  Respiratory:  Negative for cough, chest tightness and shortness of breath.   Cardiovascular:  Negative for chest pain, palpitations and leg swelling.  Gastrointestinal:  Negative for abdominal pain, blood in stool, constipation, diarrhea, nausea and vomiting.  Endocrine: Negative.   Genitourinary:  Negative for decreased urine volume, difficulty urinating, dysuria, frequency and urgency.  Musculoskeletal:  Negative for arthralgias and myalgias.  Skin: Negative.   Allergic/Immunologic: Negative.   Neurological:  Negative for  dizziness, tremors, seizures, syncope, facial asymmetry, speech difficulty, weakness, light-headedness, numbness and headaches.  Hematological: Negative.   Psychiatric/Behavioral:  Negative for confusion, hallucinations, sleep disturbance and suicidal ideas.   All other systems reviewed and are negative.       Objective:  BP (!) 161/93   Pulse 80   Temp 98.5 F (36.9 C)   Ht '5\' 7"'  (1.702 m)   Wt 153 lb (69.4 kg)   SpO2 99%   BMI 23.96 kg/m    Wt Readings from Last 3 Encounters:  06/08/22 153 lb (69.4 kg)  03/08/22 159 lb 3.2 oz (72.2 kg)  01/02/22 163 lb (73.9 kg)    Physical Exam Vitals and nursing note reviewed.  Constitutional:      General: He is not in acute distress.    Appearance: Normal appearance. He is well-developed, well-groomed and normal weight. He is not ill-appearing, toxic-appearing or diaphoretic.  HENT:     Head: Normocephalic and atraumatic.     Jaw: There is normal jaw occlusion.     Right Ear: Hearing normal.  Left Ear: Hearing normal.     Nose: Nose normal.     Mouth/Throat:     Lips: Pink.     Mouth: Mucous membranes are moist.     Pharynx: Uvula midline.  Eyes:     General: Lids are normal.     Pupils: Pupils are equal, round, and reactive to light.  Neck:     Thyroid: No thyroid mass, thyromegaly or thyroid tenderness.     Vascular: No carotid bruit or JVD.     Trachea: Trachea and phonation normal.  Cardiovascular:     Rate and Rhythm: Normal rate and regular rhythm.     Chest Wall: PMI is not displaced.     Pulses: Normal pulses.     Heart sounds: Normal heart sounds. No murmur heard.    No friction rub. No gallop.  Pulmonary:     Effort: Pulmonary effort is normal. No respiratory distress.     Breath sounds: Normal breath sounds. No wheezing.  Abdominal:     General: There is no abdominal bruit.     Palpations: There is no hepatomegaly or splenomegaly.  Musculoskeletal:        General: Normal range of motion.     Cervical  back: Normal range of motion and neck supple.     Right lower leg: No edema.     Left lower leg: No edema.  Lymphadenopathy:     Cervical: No cervical adenopathy.  Skin:    General: Skin is warm and dry.     Capillary Refill: Capillary refill takes less than 2 seconds.     Coloration: Skin is not cyanotic, jaundiced or pale.     Findings: No rash.  Neurological:     General: No focal deficit present.     Mental Status: He is alert and oriented to person, place, and time.     Sensory: Sensation is intact.     Motor: Motor function is intact.     Coordination: Coordination is intact.     Gait: Gait is intact.     Deep Tendon Reflexes: Reflexes are normal and symmetric.  Psychiatric:        Attention and Perception: Attention and perception normal.        Mood and Affect: Mood and affect normal.        Speech: Speech normal.        Behavior: Behavior normal. Behavior is cooperative.        Thought Content: Thought content normal.        Cognition and Memory: Cognition and memory normal.        Judgment: Judgment normal.     Results for orders placed or performed in visit on 03/08/22  CBC with Differential/Platelet  Result Value Ref Range   WBC 10.6 3.4 - 10.8 x10E3/uL   RBC 5.16 4.14 - 5.80 x10E6/uL   Hemoglobin 15.2 13.0 - 17.7 g/dL   Hematocrit 46.0 37.5 - 51.0 %   MCV 89 79 - 97 fL   MCH 29.5 26.6 - 33.0 pg   MCHC 33.0 31.5 - 35.7 g/dL   RDW 12.7 11.6 - 15.4 %   Platelets 269 150 - 450 x10E3/uL   Neutrophils 76 Not Estab. %   Lymphs 17 Not Estab. %   Monocytes 6 Not Estab. %   Eos 1 Not Estab. %   Basos 0 Not Estab. %   Neutrophils Absolute 8.0 (H) 1.4 - 7.0 x10E3/uL   Lymphocytes Absolute 1.8 0.7 -  3.1 x10E3/uL   Monocytes Absolute 0.6 0.1 - 0.9 x10E3/uL   EOS (ABSOLUTE) 0.1 0.0 - 0.4 x10E3/uL   Basophils Absolute 0.0 0.0 - 0.2 x10E3/uL   Immature Granulocytes 0 Not Estab. %   Immature Grans (Abs) 0.0 0.0 - 0.1 x10E3/uL  CMP14+EGFR  Result Value Ref Range    Glucose 260 (H) 70 - 99 mg/dL   BUN 15 6 - 24 mg/dL   Creatinine, Ser 1.03 0.76 - 1.27 mg/dL   eGFR 86 >59 mL/min/1.73   BUN/Creatinine Ratio 15 9 - 20   Sodium 142 134 - 144 mmol/L   Potassium 4.4 3.5 - 5.2 mmol/L   Chloride 103 96 - 106 mmol/L   CO2 23 20 - 29 mmol/L   Calcium 9.7 8.7 - 10.2 mg/dL   Total Protein 7.0 6.0 - 8.5 g/dL   Albumin 4.6 3.8 - 4.9 g/dL   Globulin, Total 2.4 1.5 - 4.5 g/dL   Albumin/Globulin Ratio 1.9 1.2 - 2.2   Bilirubin Total 1.1 0.0 - 1.2 mg/dL   Alkaline Phosphatase 75 44 - 121 IU/L   AST 19 0 - 40 IU/L   ALT 25 0 - 44 IU/L  Lipid panel  Result Value Ref Range   Cholesterol, Total 68 (L) 100 - 199 mg/dL   Triglycerides 111 0 - 149 mg/dL   HDL 31 (L) >39 mg/dL   VLDL Cholesterol Cal 21 5 - 40 mg/dL   LDL Chol Calc (NIH) 16 0 - 99 mg/dL   Chol/HDL Ratio 2.2 0.0 - 5.0 ratio  Bayer DCA Hb A1c Waived  Result Value Ref Range   HB A1C (BAYER DCA - WAIVED) 7.6 (H) 4.8 - 5.6 %       Pertinent labs & imaging results that were available during my care of the patient were reviewed by me and considered in my medical decision making.  Assessment & Plan:  Matthew Mann was seen today for medical management of chronic issues.  Diagnoses and all orders for this visit:  Type 2 diabetes mellitus with diabetic mononeuropathy, without long-term current use of insulin (HCC) A1C 8.4 today. Need to increase Ozempic to 2 mg weekly, will send new prescription today. Diet and exercise encouraged. Labs pending.  -     Bayer DCA Hb A1c Waived -     Microalbumin / creatinine urine ratio -     Semaglutide, 2 MG/DOSE, 8 MG/3ML SOPN; Inject 2 mg as directed once a week. -     atorvastatin (LIPITOR) 80 MG tablet; Take 1 tablet (80 mg total) by mouth daily.  Hyperlipidemia associated with type 2 diabetes mellitus ([place]) Diet encouraged - increase intake of fresh fruits and vegetables, increase intake of lean proteins. Bake, broil, or grill foods. Avoid fried, greasy, and fatty  foods. Avoid fast foods. Increase intake of fiber-rich whole grains. Exercise encouraged - at least 150 minutes per week and advance as tolerated. Goal BMI < 25. Continue medications as prescribed. Follow up in 3-6 months as discussed.  -     atorvastatin (LIPITOR) 80 MG tablet; Take 1 tablet (80 mg total) by mouth daily.  Hypertension associated with diabetes ([place]) BP well controlled. Changes were not made in regimen today. Goal BP is 130/80. Pt aware to report any persistent high or low readings. DASH diet and exercise encouraged. Exercise at least 150 minutes per week and increase as tolerated. Goal BMI > 25. Stress management encouraged. Avoid nicotine and tobacco product use. Avoid excessive alcohol and NSAID's. Avoid more  than 2000 mg of sodium daily. Medications as prescribed. Follow up as scheduled.  -     Microalbumin / creatinine urine ratio     Continue all other maintenance medications.  Follow up plan: Return in about 3 months (around 09/08/2022), or if symptoms worsen or fail to improve.   Continue healthy lifestyle choices, including diet (rich in fruits, vegetables, and lean proteins, and low in salt and simple carbohydrates) and exercise (at least 30 minutes of moderate physical activity daily).  Educational handout given for DM  The above assessment and management plan was discussed with the patient. The patient verbalized understanding of and has agreed to the management plan. Patient is aware to call the clinic if they develop any new symptoms or if symptoms persist or worsen. Patient is aware when to return to the clinic for a follow-up visit. Patient educated on when it is appropriate to go to the emergency department.   Monia Pouch, FNP-C [facility] [phone]

## 2022-06-09 LAB — MICROALBUMIN / CREATININE URINE RATIO
Creatinine, Urine: 70.2 mg/dL
Microalb/Creat Ratio: <4 mg/g creat (ref 0–29)
Microalbumin, Urine: <3 ug/mL

## 2022-06-14 ENCOUNTER — Other Ambulatory Visit: Payer: Self-pay | Admitting: Family Medicine

## 2022-06-14 DIAGNOSIS — I10 Essential (primary) hypertension: Secondary | ICD-10-CM

## 2022-06-14 NOTE — Progress Notes (Signed)
Cardiology Clinic Note   Patient Name: Matthew Mann Date of Encounter: 06/15/2022  Primary Care Provider:  Baruch Gouty, FNP Primary Cardiologist:  Evalina Field, MD  Patient Profile    56 year old male with history of diabetes, hyperlipidemia, coronary artery disease status post CABG x5 in 2022,, and hypertension.  Last seen in the office on 11/23/2021 and is here for 59-month follow-up.  Follow-up lipids and LFTs were ordered as he was on high-dose Lipitor and Zetia.  Total cholesterol 68, HDL 31, LDL 16 dated 03/08/2022.  Most recent hemoglobin A1c on 06/08/2022 was 8.4.  Liver tests were normal.  There was no evidence of anemia.  Past Medical History    Past Medical History:  Diagnosis Date   Diabetes mellitus without complication ([place])    Hyperlipidemia    Hypertension    Past Surgical History:  Procedure Laterality Date   CORONARY ARTERY BYPASS GRAFT N/A 06/19/2021   Procedure: CORONARY ARTERY BYPASS GRAFTING TIMES 5;  Surgeon: Lajuana Matte, MD;  Location: [place];  Service: Open Heart Surgery;  Laterality: N/A;   ENDOVEIN HARVEST OF GREATER SAPHENOUS VEIN Right 06/19/2021   Procedure: ENDOVEIN HARVEST OF GREATER SAPHENOUS VEIN;  Surgeon: Lajuana Matte, MD;  Location: [place];  Service: Open Heart Surgery;  Laterality: Right;   LEFT HEART CATH AND CORONARY ANGIOGRAPHY N/A 06/14/2021   Procedure: LEFT HEART CATH AND CORONARY ANGIOGRAPHY;  Surgeon: Sherren Mocha, MD;  Location: [place] [facility];  Service: Cardiovascular;  Laterality: N/A;   RADIAL ARTERY HARVEST Left 06/19/2021   Procedure: RADIAL ARTERY HARVEST;  Surgeon: Lajuana Matte, MD;  Location: [place];  Service: Open Heart Surgery;  Laterality: Left;   TEE WITHOUT CARDIOVERSION N/A 06/19/2021   Procedure: TRANSESOPHAGEAL ECHOCARDIOGRAM (TEE);  Surgeon: Lajuana Matte, MD;  Location: [place];  Service: Open Heart Surgery;  Laterality: N/A;    Allergies  Allergies  Allergen Reactions   Ace  Inhibitors Swelling and Other (See Comments)    Tongue swelling and cough.     History of Present Illness    Matthew Mann is a very pleasant man who presents today for 6 months follow-up with known history of coronary artery disease status post coronary bypass graft x 5 (LIMA to LAD, radial artery to OM, R SVG to diagonal 1, diagonal 2, PDA, with endoscopic greater SVG on the right, left open radial artery harvest) on 06/19/2021 by Dr. Kipp Brood.  Diabetes is followed by primary care with adjustments in Ozempic dosing.   He comes today without any complaints whatsoever.  Feels much better having recovered for greater than 6 months from CABG.  He is back to his normal activities.  He does question his cholesterol levels on high doses of atorvastatin and is asked this to be reviewed.  He is medically compliant and offers no complaints of any side effects from medications at this time.  Home Medications    Current Outpatient Medications  Medication Sig Dispense Refill   aspirin 81 MG EC tablet Take 1 tablet by mouth daily.     atorvastatin (LIPITOR) 40 MG tablet Take 1 tablet (40 mg total) by mouth daily. 90 tablet 2   clopidogrel (PLAVIX) 75 MG tablet TAKE 1 TABLET BY MOUTH EVERY DAY 90 tablet 3   dapagliflozin propanediol (FARXIGA) 10 MG TABS tablet Take 1 tablet (10 mg total) by mouth daily. 90 tablet 0   ezetimibe (ZETIA) 10 MG tablet TAKE 1 TABLET BY MOUTH EVERY DAY 90 tablet 0  losartan (COZAAR) 100 MG tablet TAKE 1 TABLET BY MOUTH EVERY DAY 90 tablet 1   metFORMIN (GLUCOPHAGE-XR) 500 MG 24 hr tablet Take 2 tablets (1,000 mg total) by mouth daily with breakfast. 180 tablet 1   metoprolol tartrate (LOPRESSOR) 25 MG tablet TAKE 1 TABLET BY MOUTH TWICE A DAY 180 tablet 3   Multiple Vitamins-Minerals (MULTIVITAMIN WITH MINERALS) tablet Take 2 tablets by mouth daily.     Semaglutide, 2 MG/DOSE, 8 MG/3ML SOPN Inject 2 mg as directed once a week. 9 mL 3   No current facility-administered  medications for this visit.     Family History    Family History  Problem Relation Age of Onset   Hypertension Mother    Heart disease Sister    Heart attack Maternal Grandfather    He indicated that his mother is deceased. He indicated that his father is deceased. He indicated that his sister is alive. He indicated that his brother is alive. He indicated that his maternal grandmother is deceased. He indicated that his maternal grandfather is deceased. He indicated that his paternal grandmother is deceased. He indicated that his paternal grandfather is deceased. He indicated that his son is alive.  Social History    Social History   Socioeconomic History   Marital status: Married    Spouse name: Not on file   Number of children: Not on file   Years of education: Not on file   Highest education level: Not on file  Occupational History   Not on file  Tobacco Use   Smoking status: Never   Smokeless tobacco: Never  Vaping Use   Vaping Use: Never used  Substance and Sexual Activity   Alcohol use: Never   Drug use: Never   Sexual activity: Yes  Other Topics Concern   Not on file  Social History Narrative   Not on file   Social Determinants of Health   Financial Resource Strain: Not on file  Food Insecurity: Not on file  Transportation Needs: Not on file  Physical Activity: Not on file  Stress: Not on file  Social Connections: Not on file  Intimate Partner Violence: Not on file     Review of Systems    General:  No chills, fever, night sweats or weight changes.  Cardiovascular:  No chest pain, dyspnea on exertion, edema, orthopnea, palpitations, paroxysmal nocturnal dyspnea. Dermatological: No rash, lesions/masses Respiratory: No cough, dyspnea Urologic: No hematuria, dysuria Abdominal:   No nausea, vomiting, diarrhea, bright red blood per rectum, melena, or hematemesis Neurologic:  No visual changes, wkns, changes in mental status. All other systems reviewed and  are otherwise negative except as noted above.     Physical Exam    VS:  BP 120/78 (BP Location: Left Arm, Patient Position: Sitting)   Pulse 83   Wt 156 lb (70.8 kg)   SpO2 98%   BMI 24.43 kg/m  , BMI Body mass index is 24.43 kg/m.     GEN: Well nourished, well developed, in no acute distress. HEENT: normal. Neck: Supple, no JVD, carotid bruits, or masses. Cardiac: RRR, no murmurs, rubs, or gallops. No clubbing, cyanosis, edema.  Radials/DP/PT 2+ and equal bilaterally.  Respiratory:  Respirations regular and unlabored, clear to auscultation bilaterally. GI: Soft, nontender, nondistended, BS + x 4. MS: no deformity or atrophy. Skin: warm and dry, no rash.  Very well-healed mid sternotomy scar, and left radial artery harvest incision. Neuro:  Strength and sensation are intact. Psych: Normal affect.  Accessory Clinical Findings    ECG personally reviewed by me today-normal sinus rhythm, heart rate 83 bpm- No acute changes  Lab Results  Component Value Date   WBC 10.6 03/08/2022   HGB 15.2 03/08/2022   HCT 46.0 03/08/2022   MCV 89 03/08/2022   PLT 269 03/08/2022   Lab Results  Component Value Date   CREATININE 1.03 03/08/2022   BUN 15 03/08/2022   NA 142 03/08/2022   K 4.4 03/08/2022   CL 103 03/08/2022   CO2 23 03/08/2022   Lab Results  Component Value Date   ALT 25 03/08/2022   AST 19 03/08/2022   ALKPHOS 75 03/08/2022   BILITOT 1.1 03/08/2022   Lab Results  Component Value Date   CHOL 68 (L) 03/08/2022   HDL 31 (L) 03/08/2022   LDLCALC 16 03/08/2022   TRIG 111 03/08/2022   CHOLHDL 2.2 03/08/2022    Lab Results  Component Value Date   HGBA1C 8.4 (H) 06/08/2022    Review of Prior Studies: CABG-06/19/2021 CABG X 5.  LIMA LAD, Radial artery to OM, RSVG to D1, D2, PDA  Endoscopic greater saphenous vein harvest on the right Left open radial artery harvest.  LHC 06/14/2022 1.  Left dominant coronary system with patent left main, severe diffuse mid LAD  stenosis, severe mid circumflex stenosis, severe left PDA stenosis, severe diagonal stenosis. 2.  Nondominant RCA with moderate diffuse plaquing 3.  Normal LV function with LVEF 55 to 65% and normal LVEDP  TTE 06/15/2021  1. There appears to be fusion of left and right coronary cusps with  functionally bicuspid aortic valve; trace AI; mild AS (mean gradient 11  mmHg).   2. Left ventricular ejection fraction, by estimation, is 60 to 65%. The  left ventricle has normal function. The left ventricle has no regional  wall motion abnormalities. Left ventricular diastolic parameters are  consistent with Grade I diastolic  dysfunction (impaired relaxation).   3. Right ventricular systolic function is normal. The right ventricular  size is normal. Tricuspid regurgitation signal is inadequate for assessing  PA pressure.   4. The mitral valve is normal in structure. Trivial mitral valve  regurgitation. No evidence of mitral stenosis.   5. The aortic valve is bicuspid. Aortic valve regurgitation is trivial.  Mild aortic valve stenosis.   6. The inferior vena cava is normal in size with greater than 50%  respiratory variability, suggesting right atrial pressure of 3 mmHg.  Assessment & Plan   1.  Coronary artery disease: Status post coronary artery bypass grafting as described above in 2022.  He is doing very well, back to his normal activities, chasing his 68-year-old grandson.  He offers no complaints of any recurrent chest pain shortness of breath or fatigue.  He is medically compliant.  We will see him again in 6 months.  2.  Hypercholesterolemia: I reviewed his most recent labs, these were drawn on 03/08/2022 with a total cholesterol of 68, triglycerides 111, HDL 31, LDL 16, on atorvastatin 80 mg daily.  I will decrease atorvastatin to 40 mg daily.  He is also on Zetia 10 mg daily.  May need to consider stopping this.  He will have follow-up lipids and LFTs in 3 months to evaluate his status on lower  dose and need to decrease further if necessary.  He offers no complaints of myalgia pain.  3.  Hypertension: Excellent control of blood pressure today.  Continue current medication regimen as directed without changes.  4.  Type 2 diabetes: Followed by PCP.  He states his latest hemoglobin A1c was 8.0.  He remains on Ozempic and metformin.  Current medicines are reviewed at length with the patient today.  I have spent 25 min's  dedicated to the care of this patient on the date of this encounter to include pre-visit review of records, assessment, management and diagnostic testing,with shared decision making.  Signed, Phill Myron. West Pugh, ANP, [place]   06/15/2022 4:54 PM      Office [phone] Fax [phone]  Notice: This dictation was prepared with Dragon dictation along with smaller phrase technology. Any transcriptional errors that result from this process are unintentional and may not be corrected upon review.

## 2022-06-15 ENCOUNTER — Encounter: Payer: Self-pay | Admitting: Adult Health

## 2022-06-15 ENCOUNTER — Ambulatory Visit: Payer: 59 | Attending: Adult Health | Admitting: Adult Health

## 2022-06-15 VITALS — BP 120/78 | HR 83 | Wt 156.0 lb

## 2022-06-15 DIAGNOSIS — E1169 Type 2 diabetes mellitus with other specified complication: Secondary | ICD-10-CM | POA: Diagnosis not present

## 2022-06-15 DIAGNOSIS — E785 Hyperlipidemia, unspecified: Secondary | ICD-10-CM

## 2022-06-15 DIAGNOSIS — I10 Essential (primary) hypertension: Secondary | ICD-10-CM | POA: Diagnosis not present

## 2022-06-15 DIAGNOSIS — I2510 Atherosclerotic heart disease of native coronary artery without angina pectoris: Secondary | ICD-10-CM | POA: Diagnosis not present

## 2022-06-15 DIAGNOSIS — Z951 Presence of aortocoronary bypass graft: Secondary | ICD-10-CM

## 2022-06-15 DIAGNOSIS — E1141 Type 2 diabetes mellitus with diabetic mononeuropathy: Secondary | ICD-10-CM

## 2022-06-15 MED ORDER — ATORVASTATIN CALCIUM 40 MG PO TABS: 40.0000 mg | ORAL_TABLET | Freq: Every day | ORAL | 2 refills | Status: DC

## 2022-06-15 NOTE — Patient Instructions (Signed)
Medication Instructions:  Decrease Atorvastatin to 40 mg ( Take 1 Tablet Daily). *If you need a refill on your cardiac medications before your next appointment, please call your pharmacy*   Lab Work: BMET, Lipid Panel, Hepatic Panel ( To Be Done in 3 Months). If you have labs (blood work) drawn today and your tests are completely normal, you will receive your results only by: [place] (if you have MyChart) OR A paper copy in the mail If you have any lab test that is abnormal or we need to change your treatment, we will call you to review the results.   Testing/Procedures: No Testing   Follow-Up: At [facility], you and your health needs are our priority.  As part of our continuing mission to provide you with exceptional heart care, we have created designated Provider Care Teams.  These Care Teams include your primary Cardiologist (physician) and Advanced Practice Providers (APPs -  Physician Assistants and Nurse Practitioners) who all work together to provide you with the care you need, when you need it.  We recommend signing up for the patient portal called "MyChart".  Sign up information is provided on this After Visit Summary.  MyChart is used to connect with patients for Virtual Visits (Telemedicine).  Patients are able to view lab/test results, encounter notes, upcoming appointments, etc.  Non-urgent messages can be sent to your provider as well.   To learn more about what you can do with MyChart, go to [email or web address].    Your next appointment:   6 month(s)  The format for your next appointment:   In Person  Provider:   Evalina Field, MD

## 2022-06-18 ENCOUNTER — Other Ambulatory Visit: Payer: Self-pay

## 2022-06-18 ENCOUNTER — Encounter: Payer: Self-pay | Admitting: Family Medicine

## 2022-06-18 DIAGNOSIS — E1141 Type 2 diabetes mellitus with diabetic mononeuropathy: Secondary | ICD-10-CM

## 2022-06-18 MED ORDER — SEMAGLUTIDE (2 MG/DOSE) 8 MG/3ML ~~LOC~~ SOPN: 2.0000 mg | PEN_INJECTOR | SUBCUTANEOUS | 3 refills | Status: DC

## 2022-07-07 ENCOUNTER — Other Ambulatory Visit: Payer: Self-pay | Admitting: Family Medicine

## 2022-07-07 DIAGNOSIS — E1141 Type 2 diabetes mellitus with diabetic mononeuropathy: Secondary | ICD-10-CM

## 2022-07-12 ENCOUNTER — Other Ambulatory Visit: Payer: Self-pay | Admitting: Family Medicine

## 2022-07-12 ENCOUNTER — Encounter: Payer: Self-pay | Admitting: Family Medicine

## 2022-07-12 DIAGNOSIS — E1141 Type 2 diabetes mellitus with diabetic mononeuropathy: Secondary | ICD-10-CM

## 2022-07-12 MED ORDER — SEMAGLUTIDE (1 MG/DOSE) 4 MG/3ML ~~LOC~~ SOPN: 2.0000 mg | PEN_INJECTOR | SUBCUTANEOUS | 1 refills | Status: DC

## 2022-07-16 ENCOUNTER — Encounter: Payer: Self-pay | Admitting: Family Medicine

## 2022-07-21 ENCOUNTER — Other Ambulatory Visit: Payer: Self-pay | Admitting: Family Medicine

## 2022-07-21 DIAGNOSIS — E1141 Type 2 diabetes mellitus with diabetic mononeuropathy: Secondary | ICD-10-CM

## 2022-08-07 ENCOUNTER — Other Ambulatory Visit: Payer: Self-pay | Admitting: Family Medicine

## 2022-08-07 DIAGNOSIS — E1141 Type 2 diabetes mellitus with diabetic mononeuropathy: Secondary | ICD-10-CM

## 2022-08-08 ENCOUNTER — Other Ambulatory Visit: Payer: Self-pay | Admitting: *Deleted

## 2022-08-08 DIAGNOSIS — E782 Mixed hyperlipidemia: Secondary | ICD-10-CM

## 2022-08-08 NOTE — Telephone Encounter (Signed)
From: Hansel Feinstein To: Office of Ronney Asters, NP Sent: 08/07/2022 4:56 PM EST Subject: Medication Renewal Request  Refills have been requested for the following medications:   ezetimibe (ZETIA) 10 MG tablet Gerri Spore T O'Neal]  Preferred pharmacy: [id number] - [place], [place] - [address] Delivery method: Pickup   Medication renewals requested in this message routed separately:   dapagliflozin propanediol (FARXIGA) 10 MG TABS tablet [BRITNEY F JOYCE]

## 2022-08-09 MED ORDER — EZETIMIBE 10 MG PO TABS: 10.0000 mg | ORAL_TABLET | Freq: Every day | ORAL | 3 refills | Status: DC

## 2022-08-10 MED ORDER — DAPAGLIFLOZIN PROPANEDIOL 10 MG PO TABS: 10.0000 mg | ORAL_TABLET | Freq: Every day | ORAL | 0 refills | Status: DC

## 2022-09-09 ENCOUNTER — Other Ambulatory Visit: Payer: Self-pay | Admitting: Family Medicine

## 2022-09-09 DIAGNOSIS — E1141 Type 2 diabetes mellitus with diabetic mononeuropathy: Secondary | ICD-10-CM

## 2022-09-10 ENCOUNTER — Encounter: Payer: Self-pay | Admitting: Family Medicine

## 2022-09-10 NOTE — Telephone Encounter (Signed)
[facility]akes NTBS in Jan for 3 mos FU Refill sent to pharmacy

## 2022-09-30 ENCOUNTER — Encounter (INDEPENDENT_AMBULATORY_CARE_PROVIDER_SITE_OTHER): Payer: Self-pay

## 2022-10-12 ENCOUNTER — Other Ambulatory Visit (HOSPITAL_COMMUNITY): Payer: Self-pay

## 2022-10-12 ENCOUNTER — Encounter: Payer: Self-pay | Admitting: Family Medicine

## 2022-10-12 ENCOUNTER — Ambulatory Visit (INDEPENDENT_AMBULATORY_CARE_PROVIDER_SITE_OTHER): Payer: 59 | Admitting: Family Medicine

## 2022-10-12 VITALS — BP 148/80 | HR 61 | Temp 96.3°F | Ht 67.0 in | Wt 156.6 lb

## 2022-10-12 DIAGNOSIS — E1169 Type 2 diabetes mellitus with other specified complication: Secondary | ICD-10-CM

## 2022-10-12 DIAGNOSIS — I152 Hypertension secondary to endocrine disorders: Secondary | ICD-10-CM

## 2022-10-12 DIAGNOSIS — E1141 Type 2 diabetes mellitus with diabetic mononeuropathy: Secondary | ICD-10-CM | POA: Diagnosis not present

## 2022-10-12 DIAGNOSIS — E785 Hyperlipidemia, unspecified: Secondary | ICD-10-CM | POA: Diagnosis not present

## 2022-10-12 DIAGNOSIS — E1159 Type 2 diabetes mellitus with other circulatory complications: Secondary | ICD-10-CM

## 2022-10-12 LAB — BAYER DCA HB A1C WAIVED: HB A1C (BAYER DCA - WAIVED): 13.1 % — ABNORMAL HIGH (ref 4.8–5.6)

## 2022-10-12 MED ORDER — METFORMIN HCL ER 500 MG PO TB24
1000.0000 mg | ORAL_TABLET | Freq: Every day | ORAL | 1 refills | Status: DC
  Filled 2022-10-12: qty 60, 30d supply, fill #0

## 2022-10-12 MED ORDER — ATORVASTATIN CALCIUM 40 MG PO TABS: 40.0000 mg | ORAL_TABLET | Freq: Every day | ORAL | 2 refills | Status: DC

## 2022-10-12 MED ORDER — OZEMPIC (2 MG/DOSE) 8 MG/3ML ~~LOC~~ SOPN
2.0000 mg | PEN_INJECTOR | SUBCUTANEOUS | 6 refills | Status: DC
  Filled 2022-10-12 – 2022-12-14 (×2): qty 3, 28d supply, fill #0
  Filled 2023-01-09: qty 3, 28d supply, fill #1

## 2022-10-12 MED ORDER — OZEMPIC (1 MG/DOSE) 4 MG/3ML ~~LOC~~ SOPN
1.0000 mg | PEN_INJECTOR | SUBCUTANEOUS | 0 refills | Status: DC
  Filled 2022-10-12: qty 3, 28d supply, fill #0

## 2022-10-12 NOTE — Progress Notes (Signed)
Subjective:  Patient ID: Matthew Mann, male    DOB: 1966-05-29, 57 y.o.   MRN: [id number]  Patient Care Team: Baruch Gouty, FNP as PCP - General (Family Medicine) O'Neal, Cassie Freer, MD as PCP - Cardiology (Cardiology) [facility] Deberah Pelton, NP as Nurse Practitioner (Cardiology)   Chief Complaint:  Diabetes   HPI: Matthew Mann is a 57 y.o. male presenting on 10/12/2022 for Diabetes   1. Type 2 diabetes mellitus with diabetic mononeuropathy, without long-term current use of insulin ([place]) Has been out of Ozempic since October. States blood sugars have been running high over the last several months. Does report polyuria. No polydipsia or polyphagia.   2. Hyperlipidemia associated with type 2 diabetes mellitus ([place]) Has been taking statin therapy and zetia without associated side effects. Does try to follow a healthy diet and exercise routine.   3. Hypertension associated with diabetes ([place]) On ARB and BB and tolerating well. No headaches, chest pain, leg swelling, weakness, confusion, or syncope. No visual changes.      Relevant past medical, surgical, family, and social history reviewed and updated as indicated.  Allergies and medications reviewed and updated. Data reviewed: Chart in Epic.   Past Medical History:  Diagnosis Date   Diabetes mellitus without complication ([place])    Hyperlipidemia    Hypertension     Past Surgical History:  Procedure Laterality Date   CORONARY ARTERY BYPASS GRAFT N/A 06/19/2021   Procedure: CORONARY ARTERY BYPASS GRAFTING TIMES 5;  Surgeon: Lajuana Matte, MD;  Location: [place];  Service: Open Heart Surgery;  Laterality: N/A;   ENDOVEIN HARVEST OF GREATER SAPHENOUS VEIN Right 06/19/2021   Procedure: ENDOVEIN HARVEST OF GREATER SAPHENOUS VEIN;  Surgeon: Lajuana Matte, MD;  Location: [place];  Service: Open Heart Surgery;  Laterality: Right;   LEFT HEART CATH AND CORONARY ANGIOGRAPHY N/A 06/14/2021   Procedure: LEFT  HEART CATH AND CORONARY ANGIOGRAPHY;  Surgeon: Sherren Mocha, MD;  Location: [place] [facility];  Service: Cardiovascular;  Laterality: N/A;   RADIAL ARTERY HARVEST Left 06/19/2021   Procedure: RADIAL ARTERY HARVEST;  Surgeon: Lajuana Matte, MD;  Location: [place];  Service: Open Heart Surgery;  Laterality: Left;   TEE WITHOUT CARDIOVERSION N/A 06/19/2021   Procedure: TRANSESOPHAGEAL ECHOCARDIOGRAM (TEE);  Surgeon: Lajuana Matte, MD;  Location: [place];  Service: Open Heart Surgery;  Laterality: N/A;    Social History   Socioeconomic History   Marital status: Married    Spouse name: Not on file   Number of children: Not on file   Years of education: Not on file   Highest education level: Not on file  Occupational History   Not on file  Tobacco Use   Smoking status: Never   Smokeless tobacco: Never  Vaping Use   Vaping Use: Never used  Substance and Sexual Activity   Alcohol use: Never   Drug use: Never   Sexual activity: Yes  Other Topics Concern   Not on file  Social History Narrative   Not on file   Social Determinants of Health   Financial Resource Strain: Not on file  Food Insecurity: Not on file  Transportation Needs: Not on file  Physical Activity: Not on file  Stress: Not on file  Social Connections: Not on file  Intimate Partner Violence: Not on file    Outpatient Encounter Medications as of 10/12/2022  Medication Sig   aspirin 81 MG EC tablet Take 1 tablet  by mouth daily.   clopidogrel (PLAVIX) 75 MG tablet TAKE 1 TABLET BY MOUTH EVERY DAY   dapagliflozin propanediol (FARXIGA) 10 MG TABS tablet Take 1 tablet (10 mg total) by mouth daily.   ezetimibe (ZETIA) 10 MG tablet Take 1 tablet (10 mg total) by mouth daily.   losartan (COZAAR) 100 MG tablet TAKE 1 TABLET BY MOUTH EVERY DAY   metoprolol tartrate (LOPRESSOR) 25 MG tablet TAKE 1 TABLET BY MOUTH TWICE A DAY   Multiple Vitamins-Minerals (MULTIVITAMIN WITH MINERALS) tablet Take 2 tablets by mouth  daily.   Semaglutide, 2 MG/DOSE, (OZEMPIC, 2 MG/DOSE,) 8 MG/3ML SOPN Inject 2 mg into the skin once a week.   [DISCONTINUED] metFORMIN (GLUCOPHAGE-XR) 500 MG 24 hr tablet TAKE 2 TABLETS BY MOUTH EVERY DAY WITH BREAKFAST   atorvastatin (LIPITOR) 40 MG tablet Take 1 tablet (40 mg total) by mouth daily.   metFORMIN (GLUCOPHAGE-XR) 500 MG 24 hr tablet Take 2 tablets (1,000 mg total) by mouth daily with breakfast.   Semaglutide, 1 MG/DOSE, (OZEMPIC, 1 MG/DOSE,) 4 MG/3ML SOPN Inject 1 mg into the skin once a week.   [DISCONTINUED] Semaglutide, 1 MG/DOSE, (OZEMPIC, 1 MG/DOSE,) 4 MG/3ML SOPN INJECT 1 MG ONCE A WEEK AS DIRECTED (Patient not taking: Reported on 10/12/2022)   No facility-administered encounter medications on file as of 10/12/2022.    Allergies  Allergen Reactions   Ace Inhibitors Swelling and Other (See Comments)    Tongue swelling and cough.     Review of Systems  Constitutional:  Negative for activity change, appetite change, chills, diaphoresis, fatigue, fever and unexpected weight change.  HENT: Negative.    Eyes: Negative.  Negative for photophobia and visual disturbance.  Respiratory:  Negative for cough, chest tightness and shortness of breath.   Cardiovascular:  Negative for chest pain, palpitations and leg swelling.  Gastrointestinal:  Negative for abdominal pain, blood in stool, constipation, diarrhea, nausea and vomiting.  Endocrine: Positive for polyuria. Negative for polydipsia and polyphagia.  Genitourinary:  Negative for decreased urine volume, difficulty urinating, dysuria, frequency and urgency.  Musculoskeletal:  Negative for arthralgias and myalgias.  Skin: Negative.   Allergic/Immunologic: Negative.   Neurological:  Negative for dizziness, tremors, seizures, syncope, facial asymmetry, speech difficulty, weakness, light-headedness, numbness and headaches.  Hematological: Negative.   Psychiatric/Behavioral:  Negative for confusion, hallucinations, sleep disturbance  and suicidal ideas.   All other systems reviewed and are negative.       Objective:  BP (!) 148/80   Pulse 61   Temp (!) 96.3 F (35.7 C) (Temporal)   Ht 5' 7"$  (1.702 m)   Wt 156 lb 9.6 oz (71 kg)   SpO2 100%   BMI 24.53 kg/m    Wt Readings from Last 3 Encounters:  10/12/22 156 lb 9.6 oz (71 kg)  06/15/22 156 lb (70.8 kg)  06/08/22 153 lb (69.4 kg)    Physical Exam Vitals and nursing note reviewed.  Constitutional:      General: He is not in acute distress.    Appearance: Normal appearance. He is well-developed, well-groomed and normal weight. He is not ill-appearing, toxic-appearing or diaphoretic.  HENT:     Head: Normocephalic and atraumatic.     Jaw: There is normal jaw occlusion.     Right Ear: Hearing normal.     Left Ear: Hearing normal.     Nose: Nose normal.     Mouth/Throat:     Lips: Pink.     Mouth: Mucous membranes are moist.  Pharynx: Oropharynx is clear. Uvula midline.  Eyes:     General: Lids are normal.     Pupils: Pupils are equal, round, and reactive to light.  Neck:     Thyroid: No thyroid mass, thyromegaly or thyroid tenderness.     Vascular: No carotid bruit or JVD.     Trachea: Trachea and phonation normal.  Cardiovascular:     Rate and Rhythm: Normal rate and regular rhythm.     Chest Wall: PMI is not displaced.     Pulses: Normal pulses.     Heart sounds: Normal heart sounds. No murmur heard.    No friction rub. No gallop.  Pulmonary:     Effort: Pulmonary effort is normal. No respiratory distress.     Breath sounds: Normal breath sounds. No wheezing.  Abdominal:     General: There is no abdominal bruit.     Palpations: There is no hepatomegaly or splenomegaly.  Musculoskeletal:        General: Normal range of motion.     Cervical back: Normal range of motion and neck supple.     Right lower leg: No edema.     Left lower leg: No edema.  Lymphadenopathy:     Cervical: No cervical adenopathy.  Skin:    General: Skin is warm  and dry.     Capillary Refill: Capillary refill takes less than 2 seconds.     Coloration: Skin is not cyanotic, jaundiced or pale.     Findings: No rash.  Neurological:     General: No focal deficit present.     Mental Status: He is alert and oriented to person, place, and time.     Sensory: Sensation is intact.     Motor: Motor function is intact.     Coordination: Coordination is intact.     Gait: Gait is intact.     Deep Tendon Reflexes: Reflexes are normal and symmetric.  Psychiatric:        Attention and Perception: Attention and perception normal.        Mood and Affect: Mood and affect normal.        Speech: Speech normal.        Behavior: Behavior normal. Behavior is cooperative.        Thought Content: Thought content normal.        Cognition and Memory: Cognition and memory normal.        Judgment: Judgment normal.    Results for orders placed or performed in visit on 10/12/22  Bayer DCA Hb A1c Waived  Result Value Ref Range   HB A1C (BAYER DCA - WAIVED) 13.1 (H) 4.8 - 5.6 %       Pertinent labs & imaging results that were available during my care of the patient were reviewed by me and considered in my medical decision making.  Assessment & Plan:  Demarr was seen today for diabetes.  Diagnoses and all orders for this visit:  Type 2 diabetes mellitus with diabetic mononeuropathy, without long-term current use of insulin ([facility]) A1C 13.1 today. He has been unable to get Ozempic and has been out of this since October. Will send to different pharmacy. He was dispensed one sample box in office to get restarted. Aware to escalate up on dose rapidly if able to tolerate. Follow up in 3 months for reevaluation. -     Bayer DCA Hb A1c Waived -     metFORMIN (GLUCOPHAGE-XR) 500 MG 24 hr tablet; Take 2  tablets (1,000 mg total) by mouth daily with breakfast. -     Semaglutide, 1 MG/DOSE, (OZEMPIC, 1 MG/DOSE,) 4 MG/3ML SOPN; Inject 1 mg into the skin once a week. -      Semaglutide, 2 MG/DOSE, (OZEMPIC, 2 MG/DOSE,) 8 MG/3ML SOPN; Inject 2 mg into the skin once a week.  Hyperlipidemia associated with type 2 diabetes mellitus ([place]) Diet encouraged - increase intake of fresh fruits and vegetables, increase intake of lean proteins. Bake, broil, or grill foods. Avoid fried, greasy, and fatty foods. Avoid fast foods. Increase intake of fiber-rich whole grains. Exercise encouraged - at least 150 minutes per week and advance as tolerated.  Goal BMI < 25. Continue medications as prescribed. Follow up in 3-6 months as discussed.  -     Lipid panel  Hypertension associated with diabetes ([place]) BP fairly controlled. Changes were not made in regimen today. Goal BP is 130/80. Pt aware to report any persistent high or low readings. DASH diet and exercise encouraged. Exercise at least 150 minutes per week and increase as tolerated. Goal BMI > 25. Stress management encouraged. Avoid nicotine and tobacco product use. Avoid excessive alcohol and NSAID's. Avoid more than 2000 mg of sodium daily. Medications as prescribed. Follow up as scheduled.  -     CBC with Differential/Platelet -     CMP14+EGFR     Continue all other maintenance medications.  Follow up plan: Return in about 3 months (around 01/10/2023), or if symptoms worsen or fail to improve, for DM.   Continue healthy lifestyle choices, including diet (rich in fruits, vegetables, and lean proteins, and low in salt and simple carbohydrates) and exercise (at least 30 minutes of moderate physical activity daily).  Educational handout given for DM  The above assessment and management plan was discussed with the patient. The patient verbalized understanding of and has agreed to the management plan. Patient is aware to call the clinic if they develop any new symptoms or if symptoms persist or worsen. Patient is aware when to return to the clinic for a follow-up visit. Patient educated on when it is appropriate to go to the  emergency department.   Monia Pouch, FNP-C [facility] [phone]

## 2022-10-12 NOTE — Patient Instructions (Signed)

## 2022-10-13 LAB — CBC WITH DIFFERENTIAL/PLATELET
Basophils Absolute: 0.1 10*3/uL (ref 0.0–0.2)
Basos: 1 %
EOS (ABSOLUTE): 0.2 10*3/uL (ref 0.0–0.4)
Eos: 2 %
Hematocrit: 48 % (ref 37.5–51.0)
Hemoglobin: 16.1 g/dL (ref 13.0–17.7)
Immature Grans (Abs): 0 10*3/uL (ref 0.0–0.1)
Immature Granulocytes: 0 %
Lymphocytes Absolute: 2.3 10*3/uL (ref 0.7–3.1)
Lymphs: 24 %
MCH: 29.7 pg (ref 26.6–33.0)
MCHC: 33.5 g/dL (ref 31.5–35.7)
MCV: 88 fL (ref 79–97)
Monocytes Absolute: 0.7 10*3/uL (ref 0.1–0.9)
Monocytes: 8 %
Neutrophils Absolute: 6.2 10*3/uL (ref 1.4–7.0)
Neutrophils: 65 %
Platelets: 222 10*3/uL (ref 150–450)
RBC: 5.43 x10E6/uL (ref 4.14–5.80)
RDW: 11.8 % (ref 11.6–15.4)
WBC: 9.5 10*3/uL (ref 3.4–10.8)

## 2022-10-13 LAB — CMP14+EGFR
ALT: 38 IU/L (ref 0–44)
AST: 27 IU/L (ref 0–40)
Albumin/Globulin Ratio: 2 (ref 1.2–2.2)
Albumin: 4.7 g/dL (ref 3.8–4.9)
Alkaline Phosphatase: 67 IU/L (ref 44–121)
BUN/Creatinine Ratio: 18 (ref 9–20)
BUN: 19 mg/dL (ref 6–24)
Bilirubin Total: 0.9 mg/dL (ref 0.0–1.2)
CO2: 23 mmol/L (ref 20–29)
Calcium: 9.9 mg/dL (ref 8.7–10.2)
Chloride: 101 mmol/L (ref 96–106)
Creatinine, Ser: 1.06 mg/dL (ref 0.76–1.27)
Globulin, Total: 2.3 g/dL (ref 1.5–4.5)
Glucose: 213 mg/dL — ABNORMAL HIGH (ref 70–99)
Potassium: 4.8 mmol/L (ref 3.5–5.2)
Sodium: 139 mmol/L (ref 134–144)
Total Protein: 7 g/dL (ref 6.0–8.5)
eGFR: 83 mL/min/{1.73_m2} (ref >59)

## 2022-10-13 LAB — LIPID PANEL
Chol/HDL Ratio: 3.1 ratio (ref 0.0–5.0)
Cholesterol, Total: 105 mg/dL (ref 100–199)
HDL: 34 mg/dL — ABNORMAL LOW (ref >39)
LDL Chol Calc (NIH): 56 mg/dL (ref 0–99)
Triglycerides: 68 mg/dL (ref 0–149)
VLDL Cholesterol Cal: 15 mg/dL (ref 5–40)

## 2022-11-06 ENCOUNTER — Other Ambulatory Visit: Payer: Self-pay | Admitting: Cardiovascular Disease

## 2022-11-07 ENCOUNTER — Other Ambulatory Visit: Payer: Self-pay | Admitting: Family Medicine

## 2022-11-07 DIAGNOSIS — E1141 Type 2 diabetes mellitus with diabetic mononeuropathy: Secondary | ICD-10-CM

## 2022-11-14 ENCOUNTER — Other Ambulatory Visit: Payer: Self-pay | Admitting: Family Medicine

## 2022-11-14 ENCOUNTER — Other Ambulatory Visit: Payer: Self-pay | Admitting: Cardiovascular Disease

## 2022-11-14 DIAGNOSIS — E1141 Type 2 diabetes mellitus with diabetic mononeuropathy: Secondary | ICD-10-CM

## 2022-12-08 ENCOUNTER — Other Ambulatory Visit: Payer: Self-pay | Admitting: Family Medicine

## 2022-12-08 DIAGNOSIS — I10 Essential (primary) hypertension: Secondary | ICD-10-CM

## 2022-12-14 ENCOUNTER — Other Ambulatory Visit: Payer: Self-pay

## 2023-01-09 ENCOUNTER — Telehealth: Payer: Self-pay | Admitting: Family Medicine

## 2023-01-09 ENCOUNTER — Other Ambulatory Visit (HOSPITAL_COMMUNITY): Payer: Self-pay

## 2023-01-09 NOTE — Telephone Encounter (Signed)
Aware labs will be done at visit tomorrow

## 2023-01-10 ENCOUNTER — Other Ambulatory Visit (HOSPITAL_COMMUNITY): Payer: Self-pay

## 2023-01-10 ENCOUNTER — Ambulatory Visit (INDEPENDENT_AMBULATORY_CARE_PROVIDER_SITE_OTHER): Payer: 59 | Admitting: Family Medicine

## 2023-01-10 ENCOUNTER — Encounter: Payer: Self-pay | Admitting: Family Medicine

## 2023-01-10 ENCOUNTER — Other Ambulatory Visit: Payer: Self-pay

## 2023-01-10 VITALS — BP 136/80 | HR 74 | Temp 97.3°F | Ht 67.0 in | Wt 153.2 lb

## 2023-01-10 DIAGNOSIS — E785 Hyperlipidemia, unspecified: Secondary | ICD-10-CM

## 2023-01-10 DIAGNOSIS — E1141 Type 2 diabetes mellitus with diabetic mononeuropathy: Secondary | ICD-10-CM | POA: Diagnosis not present

## 2023-01-10 DIAGNOSIS — E1159 Type 2 diabetes mellitus with other circulatory complications: Secondary | ICD-10-CM

## 2023-01-10 DIAGNOSIS — E1169 Type 2 diabetes mellitus with other specified complication: Secondary | ICD-10-CM | POA: Diagnosis not present

## 2023-01-10 DIAGNOSIS — Z7985 Long-term (current) use of injectable non-insulin antidiabetic drugs: Secondary | ICD-10-CM

## 2023-01-10 DIAGNOSIS — I152 Hypertension secondary to endocrine disorders: Secondary | ICD-10-CM

## 2023-01-10 LAB — BAYER DCA HB A1C WAIVED: HB A1C (BAYER DCA - WAIVED): 8.5 % — ABNORMAL HIGH (ref 4.8–5.6)

## 2023-01-10 MED ORDER — OZEMPIC (2 MG/DOSE) 8 MG/3ML ~~LOC~~ SOPN
2.0000 mg | PEN_INJECTOR | SUBCUTANEOUS | 6 refills | Status: DC
  Filled 2023-01-10 – 2023-02-06 (×2): qty 3, 28d supply, fill #0
  Filled 2023-03-06: qty 3, 28d supply, fill #1
  Filled 2023-04-03: qty 3, 28d supply, fill #2
  Filled 2023-05-01: qty 3, 28d supply, fill #3
  Filled 2023-05-29: qty 3, 28d supply, fill #4
  Filled 2023-06-22: qty 3, 28d supply, fill #5
  Filled 2023-07-20: qty 3, 28d supply, fill #6

## 2023-01-10 NOTE — Patient Instructions (Signed)

## 2023-01-10 NOTE — Progress Notes (Signed)
Subjective:  Patient ID: Matthew Mann, male    DOB: 16-Mar-1966, 57 y.o.   MRN: [id number]  Patient Care Team: Sonny Masters, FNP as PCP - General (Family Medicine) O'Neal, Ronnald Ramp, MD as PCP - Cardiology (Cardiology) [facility] [place], Thomasene Ripple, NP as Nurse Practitioner (Cardiology)   Chief Complaint:  Diabetes (3 month follow up )   HPI: Matthew Mann is a 57 y.o. male presenting on 01/10/2023 for Diabetes (3 month follow up )    1. Type 2 diabetes mellitus with diabetic mononeuropathy, without long-term current use of insulin (HCC) Has been taking medications as prescribed. Increased Ozempic to 2 mg last month and has been tolerating well. Blood sugar was 110 this morning. No polyuria, polyphagia, or polydipsia.   2. Hyperlipidemia associated with type 2 diabetes mellitus (HCC) Compliant with medications - Yes Current medications - zetia, lipitor Side effects from medications - No Diet - generally healthy Exercise - active daily  3. Hypertension associated with diabetes (HCC) Complaint with meds - Yes Current Medications - losartan and lopressor  Checking BP at home - no Exercising Regularly - Yes Watching Salt intake - Yes Pertinent ROS:  Headache - No Fatigue - No Visual Disturbances - No Chest pain - No Dyspnea - No Palpitations - No LE edema - No They report good compliance with medications and can restate their regimen by memory. No medication side effects.  BP Readings from Last 3 Encounters:  01/10/23 136/80  10/12/22 (!) 148/80  06/15/22 120/78        Relevant past medical, surgical, family, and social history reviewed and updated as indicated.  Allergies and medications reviewed and updated. Data reviewed: Chart in Epic.   Past Medical History:  Diagnosis Date   Diabetes mellitus without complication (HCC)    Hyperlipidemia    Hypertension     Past Surgical History:  Procedure Laterality Date   CORONARY ARTERY BYPASS GRAFT N/A  06/19/2021   Procedure: CORONARY ARTERY BYPASS GRAFTING TIMES 5;  Surgeon: Corliss Skains, MD;  Location: [facility] OR;  Service: Open Heart Surgery;  Laterality: N/A;   ENDOVEIN HARVEST OF GREATER SAPHENOUS VEIN Right 06/19/2021   Procedure: ENDOVEIN HARVEST OF GREATER SAPHENOUS VEIN;  Surgeon: Corliss Skains, MD;  Location: [facility] OR;  Service: Open Heart Surgery;  Laterality: Right;   LEFT HEART CATH AND CORONARY ANGIOGRAPHY N/A 06/14/2021   Procedure: LEFT HEART CATH AND CORONARY ANGIOGRAPHY;  Surgeon: Tonny Bollman, MD;  Location: [facility] INVASIVE CV LAB;  Service: Cardiovascular;  Laterality: N/A;   RADIAL ARTERY HARVEST Left 06/19/2021   Procedure: RADIAL ARTERY HARVEST;  Surgeon: Corliss Skains, MD;  Location: [facility] OR;  Service: Open Heart Surgery;  Laterality: Left;   TEE WITHOUT CARDIOVERSION N/A 06/19/2021   Procedure: TRANSESOPHAGEAL ECHOCARDIOGRAM (TEE);  Surgeon: Corliss Skains, MD;  Location: [facility] OR;  Service: Open Heart Surgery;  Laterality: N/A;    Social History   Socioeconomic History   Marital status: Married    Spouse name: Not on file   Number of children: Not on file   Years of education: Not on file   Highest education level: Master's degree (e.g., MA, MS, MEng, MEd, MSW, MBA)  Occupational History   Not on file  Tobacco Use   Smoking status: Never   Smokeless tobacco: Never  Vaping Use   Vaping Use: Never used  Substance and Sexual Activity   Alcohol use: Never   Drug use: Never   Sexual  activity: Yes  Other Topics Concern   Not on file  Social History Narrative   Not on file   Social Determinants of Health   Financial Resource Strain: Low Risk  (01/09/2023)   Overall Financial Resource Strain (CARDIA)    Difficulty of Paying Living Expenses: Not hard at all  Food Insecurity: No Food Insecurity (01/09/2023)   Hunger Vital Sign    Worried About Running Out of Food in the Last Year: Never true    Ran Out of Food in the Last Year: Never true   Transportation Needs: No Transportation Needs (01/09/2023)   PRAPARE - Administrator, Civil Service (Medical): No    Lack of Transportation (Non-Medical): No  Physical Activity: Sufficiently Active (01/09/2023)   Exercise Vital Sign    Days of Exercise per Week: 3 days    Minutes of Exercise per Session: 60 min  Stress: No Stress Concern Present (01/09/2023)   [employer] of Occupational Health - Occupational Stress Questionnaire    Feeling of Stress : Not at all  Social Connections: Socially Integrated (01/09/2023)   Social Connection and Isolation Panel [NHANES]    Frequency of Communication with Friends and Family: More than three times a week    Frequency of Social Gatherings with Friends and Family: Three times a week    Attends Religious Services: More than 4 times per year    Active Member of Clubs or Organizations: Yes    Attends Banker Meetings: More than 4 times per year    Marital Status: Married  Catering manager Violence: Not on file    Outpatient Encounter Medications as of 01/10/2023  Medication Sig   aspirin 81 MG EC tablet Take 1 tablet by mouth daily.   atorvastatin (LIPITOR) 40 MG tablet Take 1 tablet (40 mg total) by mouth daily.   clopidogrel (PLAVIX) 75 MG tablet TAKE 1 TABLET BY MOUTH EVERY DAY   ezetimibe (ZETIA) 10 MG tablet Take 1 tablet (10 mg total) by mouth daily.   FARXIGA 10 MG TABS tablet TAKE 1 TABLET BY MOUTH EVERY DAY   losartan (COZAAR) 100 MG tablet TAKE 1 TABLET BY MOUTH EVERY DAY   metFORMIN (GLUCOPHAGE-XR) 500 MG 24 hr tablet TAKE 2 TABLETS BY MOUTH EVERY DAY WITH BREAKFAST   metoprolol tartrate (LOPRESSOR) 25 MG tablet TAKE 1 TABLET BY MOUTH TWICE A DAY   Multiple Vitamins-Minerals (MULTIVITAMIN WITH MINERALS) tablet Take 2 tablets by mouth daily.   [DISCONTINUED] Semaglutide, 2 MG/DOSE, (OZEMPIC, 2 MG/DOSE,) 8 MG/3ML SOPN Inject 2 mg into the skin once a week.   Semaglutide, 2 MG/DOSE, (OZEMPIC, 2 MG/DOSE,) 8 MG/3ML  SOPN Inject 2 mg into the skin once a week.   [DISCONTINUED] Semaglutide, 1 MG/DOSE, (OZEMPIC, 1 MG/DOSE,) 4 MG/3ML SOPN Inject 1 mg into the skin once a week.   No facility-administered encounter medications on file as of 01/10/2023.    Allergies  Allergen Reactions   Ace Inhibitors Other (See Comments), Swelling and Cough    Tongue swelling and cough.    Review of Systems  Constitutional:  Negative for activity change, appetite change, chills, diaphoresis, fatigue, fever and unexpected weight change.  HENT: Negative.    Eyes: Negative.   Respiratory:  Negative for cough, chest tightness and shortness of breath.   Cardiovascular:  Negative for chest pain, palpitations and leg swelling.  Gastrointestinal:  Negative for blood in stool, constipation, diarrhea, nausea and vomiting.  Endocrine: Negative.  Negative for cold intolerance, heat intolerance,  polydipsia, polyphagia and polyuria.  Genitourinary:  Negative for decreased urine volume, difficulty urinating, dysuria, frequency and urgency.  Musculoskeletal:  Negative for arthralgias and myalgias.  Skin: Negative.   Allergic/Immunologic: Negative.   Neurological:  Negative for dizziness and headaches.  Hematological: Negative.   Psychiatric/Behavioral:  Negative for confusion, hallucinations, sleep disturbance and suicidal ideas.   All other systems reviewed and are negative.       Objective:  BP 136/80 Comment: home reading  Pulse 74   Temp (!) 97.3 F (36.3 C) (Temporal)   Ht 5\' 7"  (1.702 m)   Wt 153 lb 3.2 oz (69.5 kg)   SpO2 100%   BMI 23.99 kg/m    Wt Readings from Last 3 Encounters:  01/10/23 153 lb 3.2 oz (69.5 kg)  10/12/22 156 lb 9.6 oz (71 kg)  06/15/22 156 lb (70.8 kg)    Physical Exam Vitals and nursing note reviewed.  Constitutional:      General: He is not in acute distress.    Appearance: Normal appearance. He is well-developed and well-groomed. He is not ill-appearing, toxic-appearing or  diaphoretic.  HENT:     Head: Normocephalic and atraumatic.     Jaw: There is normal jaw occlusion.     Right Ear: Hearing normal.     Left Ear: Hearing normal.     Nose: Nose normal.     Mouth/Throat:     Lips: Pink.     Mouth: Mucous membranes are moist.     Pharynx: Oropharynx is clear. Uvula midline.  Eyes:     General: Lids are normal.     Extraocular Movements: Extraocular movements intact.     Conjunctiva/sclera: Conjunctivae normal.     Pupils: Pupils are equal, round, and reactive to light.  Neck:     Thyroid: No thyroid mass, thyromegaly or thyroid tenderness.     Vascular: No carotid bruit or JVD.     Trachea: Trachea and phonation normal.  Cardiovascular:     Rate and Rhythm: Normal rate and regular rhythm.     Chest Wall: PMI is not displaced.     Pulses: Normal pulses.     Heart sounds: Normal heart sounds. No murmur heard.    No friction rub. No gallop.  Pulmonary:     Effort: Pulmonary effort is normal. No respiratory distress.     Breath sounds: Normal breath sounds. No wheezing.  Abdominal:     General: Bowel sounds are normal. There is no abdominal bruit.     Palpations: Abdomen is soft. There is no hepatomegaly or splenomegaly.  Musculoskeletal:        General: Normal range of motion.     Cervical back: Normal range of motion and neck supple.     Right lower leg: No edema.     Left lower leg: No edema.  Lymphadenopathy:     Cervical: No cervical adenopathy.  Skin:    General: Skin is warm and dry.     Capillary Refill: Capillary refill takes less than 2 seconds.     Coloration: Skin is not cyanotic, jaundiced or pale.     Findings: No rash.  Neurological:     General: No focal deficit present.     Mental Status: He is alert and oriented to person, place, and time.     Sensory: Sensation is intact.     Motor: Motor function is intact.     Coordination: Coordination is intact.     Gait: Gait is intact.  Deep Tendon Reflexes: Reflexes are normal  and symmetric.  Psychiatric:        Attention and Perception: Attention and perception normal.        Mood and Affect: Mood and affect normal.        Speech: Speech normal.        Behavior: Behavior normal. Behavior is cooperative.        Thought Content: Thought content normal.        Cognition and Memory: Cognition and memory normal.        Judgment: Judgment normal.     Results for orders placed or performed in visit on 01/10/23  Bayer DCA Hb A1c Waived  Result Value Ref Range   HB A1C (BAYER DCA - WAIVED) 8.5 (H) 4.8 - 5.6 %       Pertinent labs & imaging results that were available during my care of the patient were reviewed by me and considered in my medical decision making.  Assessment & Plan:  Gunner was seen today for diabetes.  Diagnoses and all orders for this visit:  Type 2 diabetes mellitus with diabetic mononeuropathy, without long-term current use of insulin (HCC) A1C has improved greatly, 8.5 today. Continue with current regimen. Diet and exercise encouraged. Other labs pending.  -     Bayer DCA Hb A1c Waived -     Semaglutide, 2 MG/DOSE, (OZEMPIC, 2 MG/DOSE,) 8 MG/3ML SOPN; Inject 2 mg into the skin once a week.  Hyperlipidemia associated with type 2 diabetes mellitus (HCC) Diet encouraged - increase intake of fresh fruits and vegetables, increase intake of lean proteins. Bake, broil, or grill foods. Avoid fried, greasy, and fatty foods. Avoid fast foods. Increase intake of fiber-rich whole grains. Exercise encouraged - at least 150 minutes per week and advance as tolerated.  Goal BMI < 25. Continue medications as prescribed. Follow up in 3-6 months as discussed.  -     Lipid panel  Hypertension associated with diabetes (HCC) BP well controlled. Changes were not made in regimen today. Goal BP is 130/80. Pt aware to report any persistent high or low readings. DASH diet and exercise encouraged. Exercise at least 150 minutes per week and increase as tolerated. Goal  BMI > 25. Stress management encouraged. Avoid nicotine and tobacco product use. Avoid excessive alcohol and NSAID's. Avoid more than 2000 mg of sodium daily. Medications as prescribed. Follow up as scheduled.  -     [id number]     Continue all other maintenance medications.  Follow up plan: Return in about 4 months (around 05/13/2023), or if symptoms worsen or fail to improve, for DM.   Continue healthy lifestyle choices, including diet (rich in fruits, vegetables, and lean proteins, and low in salt and simple carbohydrates) and exercise (at least 30 minutes of moderate physical activity daily).  Educational handout given for DM  The above assessment and management plan was discussed with the patient. The patient verbalized understanding of and has agreed to the management plan. Patient is aware to call the clinic if they develop any new symptoms or if symptoms persist or worsen. Patient is aware when to return to the clinic for a follow-up visit. Patient educated on when it is appropriate to go to the emergency department.   Kari Baars, FNP-C [facility] [phone]

## 2023-01-11 LAB — CMP14+EGFR
ALT: 25 IU/L (ref 0–44)
AST: 19 IU/L (ref 0–40)
Albumin/Globulin Ratio: 2.1 (ref 1.2–2.2)
Albumin: 4.8 g/dL (ref 3.8–4.9)
Alkaline Phosphatase: 68 IU/L (ref 44–121)
BUN/Creatinine Ratio: 24 — ABNORMAL HIGH (ref 9–20)
BUN: 23 mg/dL (ref 6–24)
Bilirubin Total: 1.4 mg/dL — ABNORMAL HIGH (ref 0.0–1.2)
CO2: 20 mmol/L (ref 20–29)
Calcium: 10.1 mg/dL (ref 8.7–10.2)
Chloride: 103 mmol/L (ref 96–106)
Creatinine, Ser: 0.94 mg/dL (ref 0.76–1.27)
Globulin, Total: 2.3 g/dL (ref 1.5–4.5)
Glucose: 120 mg/dL — ABNORMAL HIGH (ref 70–99)
Potassium: 4.5 mmol/L (ref 3.5–5.2)
Sodium: 139 mmol/L (ref 134–144)
Total Protein: 7.1 g/dL (ref 6.0–8.5)
eGFR: 96 mL/min/{1.73_m2} (ref >59)

## 2023-01-11 LAB — LIPID PANEL
Chol/HDL Ratio: 2.5 ratio (ref 0.0–5.0)
Cholesterol, Total: 86 mg/dL — ABNORMAL LOW (ref 100–199)
HDL: 34 mg/dL — ABNORMAL LOW (ref >39)
LDL Chol Calc (NIH): 37 mg/dL (ref 0–99)
Triglycerides: 68 mg/dL (ref 0–149)
VLDL Cholesterol Cal: 15 mg/dL (ref 5–40)

## 2023-02-03 ENCOUNTER — Other Ambulatory Visit: Payer: Self-pay | Admitting: Family Medicine

## 2023-02-03 DIAGNOSIS — E1141 Type 2 diabetes mellitus with diabetic mononeuropathy: Secondary | ICD-10-CM

## 2023-02-06 ENCOUNTER — Other Ambulatory Visit: Payer: Self-pay

## 2023-02-08 ENCOUNTER — Other Ambulatory Visit: Payer: Self-pay | Admitting: Family Medicine

## 2023-02-08 DIAGNOSIS — E1141 Type 2 diabetes mellitus with diabetic mononeuropathy: Secondary | ICD-10-CM

## 2023-03-08 ENCOUNTER — Other Ambulatory Visit: Payer: Self-pay

## 2023-03-08 ENCOUNTER — Other Ambulatory Visit: Payer: Self-pay | Admitting: Family Medicine

## 2023-03-08 DIAGNOSIS — I10 Essential (primary) hypertension: Secondary | ICD-10-CM

## 2023-03-21 LAB — HM DIABETES EYE EXAM

## 2023-04-04 ENCOUNTER — Other Ambulatory Visit (HOSPITAL_COMMUNITY): Payer: Self-pay

## 2023-04-21 NOTE — Progress Notes (Unsigned)
Cardiology Clinic Note   Patient Name: Matthew Mann Date of Encounter: 04/23/2023  Primary Care Provider:  Sonny Masters, FNP Primary Cardiologist:  Reatha Harps, MD  Patient Profile    57 year old male with history of diabetes, hyperlipidemia, coronary artery disease status post CABG x5 in 2022, (LIMA to LAD, radial artery to OM, R SVG to diagonal 1, diagonal 2, PDA, with endoscopic greater SVG on the right, left open radial artery harvest) on 06/19/2021 by Dr. Cliffton Asters.  and hypertension.   Past Medical History    Past Medical History:  Diagnosis Date   Diabetes mellitus without complication (HCC)    Hyperlipidemia    Hypertension    Past Surgical History:  Procedure Laterality Date   CORONARY ARTERY BYPASS GRAFT N/A 06/19/2021   Procedure: CORONARY ARTERY BYPASS GRAFTING TIMES 5;  Surgeon: Corliss Skains, MD;  Location: [facility] OR;  Service: Open Heart Surgery;  Laterality: N/A;   ENDOVEIN HARVEST OF GREATER SAPHENOUS VEIN Right 06/19/2021   Procedure: ENDOVEIN HARVEST OF GREATER SAPHENOUS VEIN;  Surgeon: Corliss Skains, MD;  Location: [facility] OR;  Service: Open Heart Surgery;  Laterality: Right;   LEFT HEART CATH AND CORONARY ANGIOGRAPHY N/A 06/14/2021   Procedure: LEFT HEART CATH AND CORONARY ANGIOGRAPHY;  Surgeon: Tonny Bollman, MD;  Location: [facility] INVASIVE CV LAB;  Service: Cardiovascular;  Laterality: N/A;   RADIAL ARTERY HARVEST Left 06/19/2021   Procedure: RADIAL ARTERY HARVEST;  Surgeon: Corliss Skains, MD;  Location: [facility] OR;  Service: Open Heart Surgery;  Laterality: Left;   TEE WITHOUT CARDIOVERSION N/A 06/19/2021   Procedure: TRANSESOPHAGEAL ECHOCARDIOGRAM (TEE);  Surgeon: Corliss Skains, MD;  Location: [facility] OR;  Service: Open Heart Surgery;  Laterality: N/A;    Allergies  Allergies  Allergen Reactions   Ace Inhibitors Other (See Comments), Swelling and Cough    Tongue swelling and cough.    History of Present Illness    [name] returns to  the office today for ongoing assessment and management of CAD, with history of coronary bypass grafting x 5 in 2002 as described above, hyperlipidemia, type 2 diabetes.  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  He does have occasional back pain at the end of a long day.  He has been having some epistaxis on rare occasions from the clopidogrel.  He also has some low-grade nausea at times when he takes his pills in the morning despite taking it with food.  He offers no complaints of any new cardiac symptoms, swelling, pain in his legs with walking, or palpitations.  He is returned to his normal duties and normal exercise level.  Home Medications    Current Outpatient Medications  Medication Sig Dispense Refill   aspirin 81 MG EC tablet Take 1 tablet by mouth daily.     atorvastatin (LIPITOR) 40 MG tablet Take 1 tablet (40 mg total) by mouth daily. 90 tablet 2   clopidogrel (PLAVIX) 75 MG tablet TAKE 1 TABLET BY MOUTH EVERY DAY 90 tablet 3   dapagliflozin propanediol (FARXIGA) 10 MG TABS tablet TAKE 1 TABLET BY MOUTH EVERY DAY 90 tablet 1   ezetimibe (ZETIA) 10 MG tablet Take 1 tablet (10 mg total) by mouth daily. 90 tablet 3   losartan (COZAAR) 100 MG tablet TAKE 1 TABLET BY MOUTH EVERY DAY 90 tablet 0   metFORMIN (GLUCOPHAGE-XR) 500 MG 24 hr tablet TAKE 2 TABLETS BY MOUTH EVERY DAY  WITH BREAKFAST 180 tablet 0   metoprolol tartrate (LOPRESSOR) 25 MG tablet TAKE 1 TABLET BY MOUTH TWICE A DAY 180 tablet 2   Multiple Vitamins-Minerals (MULTIVITAMIN WITH MINERALS) tablet Take 2 tablets by mouth daily.     Semaglutide, 2 MG/DOSE, (OZEMPIC, 2 MG/DOSE,) 8 MG/3ML SOPN Inject 2 mg into the skin once a week. 3 mL 6   No current facility-administered medications for this visit.     Family History    Family History  Problem Relation Age of Onset   Hypertension Mother    Heart disease Sister    Heart attack Maternal  Grandfather    He indicated that his mother is deceased. He indicated that his father is deceased. He indicated that his sister is alive. He indicated that his brother is alive. He indicated that his maternal grandmother is deceased. He indicated that his maternal grandfather is deceased. He indicated that his paternal grandmother is deceased. He indicated that his paternal grandfather is deceased. He indicated that his son is alive.  Social History    Social History   Socioeconomic History   Marital status: Married    Spouse name: Not on file   Number of children: Not on file   Years of education: Not on file   Highest education level: Master's degree (e.g., MA, MS, MEng, MEd, MSW, MBA)  Occupational History   Not on file  Tobacco Use   Smoking status: Never   Smokeless tobacco: Never  Vaping Use   Vaping status: Never Used  Substance and Sexual Activity   Alcohol use: Never   Drug use: Never   Sexual activity: Yes  Other Topics Concern   Not on file  Social History Narrative   Not on file   Social Determinants of Health   Financial Resource Strain: Low Risk  (01/09/2023)   Overall Financial Resource Strain (CARDIA)    Difficulty of Paying Living Expenses: Not hard at all  Food Insecurity: No Food Insecurity (01/09/2023)   Hunger Vital Sign    Worried About Running Out of Food in the Last Year: Never true    Ran Out of Food in the Last Year: Never true  Transportation Needs: No Transportation Needs (01/09/2023)   PRAPARE - Administrator, Civil Service (Medical): No    Lack of Transportation (Non-Medical): No  Physical Activity: Sufficiently Active (01/09/2023)   Exercise Vital Sign    Days of Exercise per Week: 3 days    Minutes of Exercise per Session: 60 min  Stress: No Stress Concern Present (01/09/2023)   [employer] of Occupational Health - Occupational Stress Questionnaire    Feeling of Stress : Not at all  Social Connections: Socially Integrated  (01/09/2023)   Social Connection and Isolation Panel [NHANES]    Frequency of Communication with Friends and Family: More than three times a week    Frequency of Social Gatherings with Friends and Family: Three times a week    Attends Religious Services: More than 4 times per year    Active Member of Clubs or Organizations: Yes    Attends Banker Meetings: More than 4 times per year    Marital Status: Married  Catering manager Violence: Unknown (12/04/2021)   Received from [facility]   HITS    Physically Hurt: Not on file    Insult or Talk Down To: Not on file    Threaten Physical Harm: Not on file    Scream or Curse: Not on  file     Review of Systems    General:  No chills, fever, night sweats or weight changes.  Cardiovascular:  No chest pain, dyspnea on exertion, edema, orthopnea, palpitations, paroxysmal nocturnal dyspnea. Dermatological: No rash, lesions/masses Respiratory: No cough, dyspnea Urologic: No hematuria, dysuria Abdominal: Positive for low-grade nausea after taking pills in the morning,, no vomiting, diarrhea, bright red blood per rectum, melena, or hematemesis Neurologic:  No visual changes, wkns, changes in mental status. All other systems reviewed and are otherwise negative except as noted above.       Physical Exam    VS:  BP (!) 154/86 (BP Location: Left Arm, Patient Position: Sitting, Cuff Size: Normal)   Pulse 66   Ht 5\' 7"  (1.702 m)   Wt 142 lb 12.8 oz (64.8 kg)   SpO2 99%   BMI 22.37 kg/m  , BMI Body mass index is 22.37 kg/m.     GEN: Well nourished, well developed, in no acute distress. HEENT: normal. Neck: Supple, no JVD, carotid bruits, or masses. Cardiac: RRR, split S2 heard best at the left sternal border, no murmurs, rubs, or gallops. No clubbing, cyanosis, edema.  Radials/DP/PT 2+ and equal bilaterally.  Respiratory:  Respirations regular and unlabored, clear to auscultation bilaterally. GI: Soft, nontender, nondistended, BS +  x 4. MS: no deformity or atrophy. Skin: warm and dry, no rash. Neuro:  Strength and sensation are intact. Psych: Normal affect.      Lab Results  Component Value Date   WBC 9.5 10/12/2022   HGB 16.1 10/12/2022   HCT 48.0 10/12/2022   MCV 88 10/12/2022   PLT 222 10/12/2022   Lab Results  Component Value Date   CREATININE 0.94 01/10/2023   BUN 23 01/10/2023   NA 139 01/10/2023   K 4.5 01/10/2023   CL 103 01/10/2023   CO2 20 01/10/2023   Lab Results  Component Value Date   ALT 25 01/10/2023   AST 19 01/10/2023   ALKPHOS 68 01/10/2023   BILITOT 1.4 (H) 01/10/2023   Lab Results  Component Value Date   CHOL 86 (L) 01/10/2023   HDL 34 (L) 01/10/2023   LDLCALC 37 01/10/2023   TRIG 68 01/10/2023   CHOLHDL 2.5 01/10/2023    Lab Results  Component Value Date   HGBA1C 8.5 (H) 01/10/2023     Review of Prior Studies    CABG-06/19/2021 CABG X 5.  LIMA LAD, Radial artery to OM, RSVG to D1, D2, PDA  Endoscopic greater saphenous vein harvest on the right Left open radial artery harvest.   LHC 06/14/2022 1.  Left dominant coronary system with patent left main, severe diffuse mid LAD stenosis, severe mid circumflex stenosis, severe left PDA stenosis, severe diagonal stenosis. 2.  Nondominant RCA with moderate diffuse plaquing 3.  Normal LV function with LVEF 55 to 65% and normal LVEDP   TTE 06/15/2021  1. There appears to be fusion of left and right coronary cusps with  functionally bicuspid aortic valve; trace AI; mild AS (mean gradient 11  mmHg).   2. Left ventricular ejection fraction, by estimation, is 60 to 65%. The  left ventricle has normal function. The left ventricle has no regional  wall motion abnormalities. Left ventricular diastolic parameters are  consistent with Grade I diastolic  dysfunction (impaired relaxation).   3. Right ventricular systolic function is normal. The right ventricular  size is normal. Tricuspid regurgitation signal is inadequate for  assessing  PA pressure.   4. The mitral  valve is normal in structure. Trivial mitral valve  regurgitation. No evidence of mitral stenosis.   5. The aortic valve is bicuspid. Aortic valve regurgitation is trivial.  Mild aortic valve stenosis.   6. The inferior vena cava is normal in size with greater than 50%  respiratory variability, suggesting right atrial pressure of 3 mmHg.  Assessment & Plan   1.  Coronary artery disease: Status post coronary artery bypass grafting in 2022.  He will continue aspirin 81 mg daily but stop clopidogrel as it has been greater than 1 year since STEMI.  He will continue secondary management with blood pressure control, diabetes management, low-cholesterol diet, purposeful exercise.  2.  Hypercholesterolemia: Goal of LDL less than 50 with diabetes and coronary artery bypass grafting, continue atorvastatin and Zetia.  Will need to have fasting lipids and LFTs on follow-up appointment if not completed by primary care.  3.  Hypertension: Continue to keep blood pressure 100/60 which is optimal for diabetic patient with coronary artery disease.  Currently on losartan 100 mg daily, and metoprolol tartrate 25 mg twice daily.  He is to monitor this.  Today it is elevated in the office will need to keep close control as well as low-sodium diet.  If necessary add low-dose HCTZ or low-dose calcium channel blocker.  4.  Type 2 diabetes: Managed by primary care.  Signed, Bettey Mare. Liborio Nixon, ANP, AACC   04/23/2023 12:05 PM      Office [phone] Fax [phone]  Notice: This dictation was prepared with Dragon dictation along with smaller phrase technology. Any transcriptional errors that result from this process are unintentional and may not be corrected upon review.

## 2023-04-23 ENCOUNTER — Encounter: Payer: Self-pay | Admitting: Adult Health

## 2023-04-23 ENCOUNTER — Ambulatory Visit: Payer: 59 | Attending: Adult Health | Admitting: Adult Health

## 2023-04-23 VITALS — BP 154/86 | HR 66 | Ht 67.0 in | Wt 142.8 lb

## 2023-04-23 DIAGNOSIS — I25810 Atherosclerosis of coronary artery bypass graft(s) without angina pectoris: Secondary | ICD-10-CM | POA: Diagnosis not present

## 2023-04-23 DIAGNOSIS — E7800 Pure hypercholesterolemia, unspecified: Secondary | ICD-10-CM

## 2023-04-23 DIAGNOSIS — I10 Essential (primary) hypertension: Secondary | ICD-10-CM | POA: Diagnosis not present

## 2023-04-23 DIAGNOSIS — Z951 Presence of aortocoronary bypass graft: Secondary | ICD-10-CM | POA: Diagnosis not present

## 2023-04-23 NOTE — Patient Instructions (Signed)
Medication Instructions:  Your physician recommends that you continue on your current medications as directed. Please refer to the Current Medication list given to you today.  *If you need a refill on your cardiac medications before your next appointment, please call your pharmacy*   Lab Work: NONE ordered at this time of appointment   Testing/Procedures: NONE ordered at this time of appointment     Follow-Up: At [facility], you and your health needs are our priority.  As part of our continuing mission to provide you with exceptional heart care, we have created designated Provider Care Teams.  These Care Teams include your primary Cardiologist (physician) and Advanced Practice Providers (APPs -  Physician Assistants and Nurse Practitioners) who all work together to provide you with the care you need, when you need it.  We recommend signing up for the patient portal called "MyChart".  Sign up information is provided on this After Visit Summary.  MyChart is used to connect with patients for Virtual Visits (Telemedicine).  Patients are able to view lab/test results, encounter notes, upcoming appointments, etc.  Non-urgent messages can be sent to your provider as well.   To learn more about what you can do with MyChart, go to [email or web address].    Your next appointment:   6 month(s)  Provider:   Reatha Harps, MD

## 2023-05-01 ENCOUNTER — Other Ambulatory Visit (HOSPITAL_COMMUNITY): Payer: Self-pay

## 2023-05-02 ENCOUNTER — Other Ambulatory Visit (HOSPITAL_COMMUNITY): Payer: Self-pay

## 2023-05-02 ENCOUNTER — Encounter (HOSPITAL_COMMUNITY): Payer: Self-pay

## 2023-05-08 ENCOUNTER — Other Ambulatory Visit: Payer: Self-pay | Admitting: Family Medicine

## 2023-05-08 DIAGNOSIS — E1141 Type 2 diabetes mellitus with diabetic mononeuropathy: Secondary | ICD-10-CM

## 2023-05-14 ENCOUNTER — Encounter: Payer: Self-pay | Admitting: Family Medicine

## 2023-05-14 ENCOUNTER — Ambulatory Visit (INDEPENDENT_AMBULATORY_CARE_PROVIDER_SITE_OTHER): Payer: 59 | Admitting: Family Medicine

## 2023-05-14 VITALS — BP 136/88 | HR 72 | Temp 97.6°F | Ht 67.0 in | Wt 143.8 lb

## 2023-05-14 DIAGNOSIS — Z7985 Long-term (current) use of injectable non-insulin antidiabetic drugs: Secondary | ICD-10-CM

## 2023-05-14 DIAGNOSIS — E1169 Type 2 diabetes mellitus with other specified complication: Secondary | ICD-10-CM

## 2023-05-14 DIAGNOSIS — E785 Hyperlipidemia, unspecified: Secondary | ICD-10-CM

## 2023-05-14 DIAGNOSIS — I252 Old myocardial infarction: Secondary | ICD-10-CM | POA: Diagnosis not present

## 2023-05-14 DIAGNOSIS — E1141 Type 2 diabetes mellitus with diabetic mononeuropathy: Secondary | ICD-10-CM

## 2023-05-14 DIAGNOSIS — I152 Hypertension secondary to endocrine disorders: Secondary | ICD-10-CM

## 2023-05-14 DIAGNOSIS — Z7984 Long term (current) use of oral hypoglycemic drugs: Secondary | ICD-10-CM

## 2023-05-14 DIAGNOSIS — E1159 Type 2 diabetes mellitus with other circulatory complications: Secondary | ICD-10-CM | POA: Diagnosis not present

## 2023-05-14 LAB — BAYER DCA HB A1C WAIVED: HB A1C (BAYER DCA - WAIVED): 6.8 % — ABNORMAL HIGH (ref 4.8–5.6)

## 2023-05-14 MED ORDER — LOSARTAN POTASSIUM 100 MG PO TABS
100.0000 mg | ORAL_TABLET | Freq: Every day | ORAL | 2 refills | Status: DC
Start: 2023-05-14 — End: 2023-06-24

## 2023-05-14 NOTE — Patient Instructions (Addendum)

## 2023-05-14 NOTE — Progress Notes (Signed)
Subjective:  Patient ID: Matthew Mann, male    DOB: [birth year], 57 y.o.   MRN: [id number]  Patient Care Team: Sonny Masters, FNP as PCP - General (Family Medicine) O'Neal, Ronnald Ramp, MD as PCP - Cardiology (Cardiology) [facility], Thomasene Ripple, NP as Nurse Practitioner (Cardiology)   Chief Complaint:  Diabetes (4 month follow up )   HPI: Matthew Mann is a 57 y.o. male presenting on 05/14/2023 for Diabetes (4 month follow up )    1. Type 2 diabetes mellitus with diabetic mononeuropathy, without long-term current use of insulin (HCC) Has been doing great on current regimen: metformin XR 500 mg, Ozempic 2 mg, and Farxiga 10 mg. He is tolerating all medications well. No polyuria, polyphagia, or polydipsia. BS running in the lot to mid 100 range.   2. Hypertension associated with diabetes (HCC) Complaint with meds - Yes Current Medications - lopressor, losartan Checking BP at home ranging 120-130/70-80 Exercising Regularly - Yes Watching Salt intake - Yes Pertinent ROS:  Headache - No Fatigue - No Visual Disturbances - No Chest pain - No Dyspnea - No Palpitations - No LE edema - No They report good compliance with medications and can restate their regimen by memory. No medication side effects.  BP Readings from Last 3 Encounters:  05/14/23 136/88  04/23/23 (!) 154/86  01/10/23 136/80     3. Hyperlipidemia associated with type 2 diabetes mellitus (HCC) Compliant with medications - Yes Current medications - zetia, atorvastatin  Side effects from medications - No Diet - generally healthy Exercise - active daily  4. History of non-ST elevation myocardial infarction (NSTEMI) Was seen by the cardiologist recently. Those notes reviewed. Echo reviewed. Pt states he is doing well. No anginal symptoms. Compliant with all medications. Plavix has been stopped, ASA continued by cardiologist.      Relevant past medical, surgical, family, and social history  reviewed and updated as indicated.  Allergies and medications reviewed and updated. Data reviewed: Chart in Epic.   Past Medical History:  Diagnosis Date   Diabetes mellitus without complication (HCC)    Hyperlipidemia    Hypertension     Past Surgical History:  Procedure Laterality Date   CORONARY ARTERY BYPASS GRAFT N/A 06/19/2021   Procedure: CORONARY ARTERY BYPASS GRAFTING TIMES 5;  Surgeon: Corliss Skains, MD;  Location: [facility] OR;  Service: Open Heart Surgery;  Laterality: N/A;   ENDOVEIN HARVEST OF GREATER SAPHENOUS VEIN Right 06/19/2021   Procedure: ENDOVEIN HARVEST OF GREATER SAPHENOUS VEIN;  Surgeon: Corliss Skains, MD;  Location: [facility] OR;  Service: Open Heart Surgery;  Laterality: Right;   LEFT HEART CATH AND CORONARY ANGIOGRAPHY N/A 06/14/2021   Procedure: LEFT HEART CATH AND CORONARY ANGIOGRAPHY;  Surgeon: Tonny Bollman, MD;  Location: [facility] INVASIVE CV LAB;  Service: Cardiovascular;  Laterality: N/A;   RADIAL ARTERY HARVEST Left 06/19/2021   Procedure: RADIAL ARTERY HARVEST;  Surgeon: Corliss Skains, MD;  Location: [facility] OR;  Service: Open Heart Surgery;  Laterality: Left;   TEE WITHOUT CARDIOVERSION N/A 06/19/2021   Procedure: TRANSESOPHAGEAL ECHOCARDIOGRAM (TEE);  Surgeon: Corliss Skains, MD;  Location: [facility] OR;  Service: Open Heart Surgery;  Laterality: N/A;    Social History   Socioeconomic History   Marital status: Married    Spouse name: Not on file   Number of children: Not on file   Years of education: Not on file   Highest education level: Master's degree (e.g., MA, MS, MEng, MEd,  MSW, MBA)  Occupational History   Not on file  Tobacco Use   Smoking status: Never   Smokeless tobacco: Never  Vaping Use   Vaping status: Never Used  Substance and Sexual Activity   Alcohol use: Never   Drug use: Never   Sexual activity: Yes  Other Topics Concern   Not on file  Social History Narrative   Not on file   Social Determinants of Health    Financial Resource Strain: Low Risk  (01/09/2023)   Overall Financial Resource Strain (CARDIA)    Difficulty of Paying Living Expenses: Not hard at all  Food Insecurity: No Food Insecurity (01/09/2023)   Hunger Vital Sign    Worried About Running Out of Food in the Last Year: Never true    Ran Out of Food in the Last Year: Never true  Transportation Needs: No Transportation Needs (01/09/2023)   PRAPARE - Administrator, Civil Service (Medical): No    Lack of Transportation (Non-Medical): No  Physical Activity: Sufficiently Active (01/09/2023)   Exercise Vital Sign    Days of Exercise per Week: 3 days    Minutes of Exercise per Session: 60 min  Stress: No Stress Concern Present (01/09/2023)   [employer] of Occupational Health - Occupational Stress Questionnaire    Feeling of Stress : Not at all  Social Connections: Socially Integrated (01/09/2023)   Social Connection and Isolation Panel [NHANES]    Frequency of Communication with Friends and Family: More than three times a week    Frequency of Social Gatherings with Friends and Family: Three times a week    Attends Religious Services: More than 4 times per year    Active Member of Clubs or Organizations: Yes    Attends Banker Meetings: More than 4 times per year    Marital Status: Married  Catering manager Violence: Unknown (12/04/2021)   Received from [employer], [facility]   HITS    Physically Hurt: Not on file    Insult or Talk Down To: Not on file    Threaten Physical Harm: Not on file    Scream or Curse: Not on file    Outpatient Encounter Medications as of 05/14/2023  Medication Sig   aspirin 81 MG EC tablet Take 1 tablet by mouth daily.   dapagliflozin propanediol (FARXIGA) 10 MG TABS tablet TAKE 1 TABLET BY MOUTH EVERY DAY   ezetimibe (ZETIA) 10 MG tablet Take 1 tablet (10 mg total) by mouth daily.   metFORMIN (GLUCOPHAGE-XR) 500 MG 24 hr tablet TAKE 2 TABLETS BY MOUTH EVERY DAY WITH BREAKFAST    metoprolol tartrate (LOPRESSOR) 25 MG tablet TAKE 1 TABLET BY MOUTH TWICE A DAY   Multiple Vitamins-Minerals (MULTIVITAMIN WITH MINERALS) tablet Take 2 tablets by mouth daily.   Semaglutide, 2 MG/DOSE, (OZEMPIC, 2 MG/DOSE,) 8 MG/3ML SOPN Inject 2 mg into the skin once a week.   [DISCONTINUED] losartan (COZAAR) 100 MG tablet TAKE 1 TABLET BY MOUTH EVERY DAY   atorvastatin (LIPITOR) 40 MG tablet Take 1 tablet (40 mg total) by mouth daily.   losartan (COZAAR) 100 MG tablet Take 1 tablet (100 mg total) by mouth daily.   [DISCONTINUED] clopidogrel (PLAVIX) 75 MG tablet TAKE 1 TABLET BY MOUTH EVERY DAY   No facility-administered encounter medications on file as of 05/14/2023.    Allergies  Allergen Reactions   Ace Inhibitors Other (See Comments), Swelling and Cough    Tongue swelling and cough.   Tropicamide Nausea  Only and Nausea And Vomiting    Review of Systems  Constitutional:  Negative for activity change, appetite change, chills, diaphoresis, fatigue, fever and unexpected weight change.  HENT: Negative.    Eyes: Negative.  Negative for photophobia and visual disturbance.  Respiratory:  Negative for cough, chest tightness and shortness of breath.   Cardiovascular:  Negative for chest pain, palpitations and leg swelling.  Gastrointestinal:  Negative for anal bleeding, blood in stool, constipation, diarrhea, nausea and vomiting.  Endocrine: Negative.  Negative for cold intolerance, heat intolerance, polydipsia, polyphagia and polyuria.  Genitourinary:  Negative for decreased urine volume, difficulty urinating, dysuria, frequency, hematuria and urgency.  Musculoskeletal:  Negative for arthralgias and myalgias.  Skin: Negative.   Allergic/Immunologic: Negative.   Neurological:  Negative for dizziness and headaches.  Hematological: Negative.  Does not bruise/bleed easily.  Psychiatric/Behavioral:  Negative for confusion, hallucinations, sleep disturbance and suicidal ideas.   All other  systems reviewed and are negative.       Objective:  BP 136/88   Pulse 72   Temp 97.6 F (36.4 C) (Temporal)   Ht 5\' 7"  (1.702 m)   Wt 143 lb 12.8 oz (65.2 kg)   SpO2 100%   BMI 22.52 kg/m    Wt Readings from Last 3 Encounters:  05/14/23 143 lb 12.8 oz (65.2 kg)  04/23/23 142 lb 12.8 oz (64.8 kg)  01/10/23 153 lb 3.2 oz (69.5 kg)    Physical Exam Vitals and nursing note reviewed.  Constitutional:      General: He is not in acute distress.    Appearance: Normal appearance. He is well-developed and well-groomed. He is not ill-appearing, toxic-appearing or diaphoretic.  HENT:     Head: Normocephalic and atraumatic.     Jaw: There is normal jaw occlusion.     Right Ear: Hearing normal.     Left Ear: Hearing normal.     Nose: Nose normal.     Mouth/Throat:     Lips: Pink.     Mouth: Mucous membranes are moist.     Pharynx: Oropharynx is clear. Uvula midline.  Eyes:     General: Lids are normal.     Extraocular Movements: Extraocular movements intact.     Conjunctiva/sclera: Conjunctivae normal.     Pupils: Pupils are equal, round, and reactive to light.  Neck:     Thyroid: No thyroid mass, thyromegaly or thyroid tenderness.     Vascular: No carotid bruit or JVD.     Trachea: Trachea and phonation normal.  Cardiovascular:     Rate and Rhythm: Normal rate and regular rhythm.     Chest Wall: PMI is not displaced.     Pulses: Normal pulses.     Heart sounds: Normal heart sounds. No murmur heard.    No friction rub. No gallop.  Pulmonary:     Effort: Pulmonary effort is normal. No respiratory distress.     Breath sounds: Normal breath sounds. No wheezing.  Abdominal:     General: Bowel sounds are normal. There is no distension or abdominal bruit.     Palpations: Abdomen is soft. There is no hepatomegaly or splenomegaly.     Tenderness: There is no abdominal tenderness. There is no right CVA tenderness or left CVA tenderness.     Hernia: No hernia is present.   Musculoskeletal:        General: Normal range of motion.     Cervical back: Normal range of motion and neck supple.  Right lower leg: No edema.     Left lower leg: No edema.  Lymphadenopathy:     Cervical: No cervical adenopathy.  Skin:    General: Skin is warm and dry.     Capillary Refill: Capillary refill takes less than 2 seconds.     Coloration: Skin is not cyanotic, jaundiced or pale.     Findings: No rash.  Neurological:     General: No focal deficit present.     Mental Status: He is alert and oriented to person, place, and time.     Sensory: Sensation is intact.     Motor: Motor function is intact.     Coordination: Coordination is intact.     Gait: Gait is intact.     Deep Tendon Reflexes: Reflexes are normal and symmetric.  Psychiatric:        Attention and Perception: Attention and perception normal.        Mood and Affect: Mood and affect normal.        Speech: Speech normal.        Behavior: Behavior normal. Behavior is cooperative.        Thought Content: Thought content normal.        Cognition and Memory: Cognition and memory normal.        Judgment: Judgment normal.     Results for orders placed or performed in visit on 03/26/23  HM DIABETES EYE EXAM  Result Value Ref Range   HM Diabetic Eye Exam No Retinopathy No Retinopathy       Pertinent labs & imaging results that were available during my care of the patient were reviewed by me and considered in my medical decision making.  Assessment & Plan:  Matthew "Nate" was seen today for diabetes.  Diagnoses and all orders for this visit:  Type 2 diabetes mellitus with diabetic mononeuropathy, without long-term current use of insulin (HCC) A1C 6.8, doing great on current regimen. Continue along with diet and exercise.  -     Bayer DCA Hb A1c Waived  Hypertension associated with diabetes (HCC) BP well controlled. Changes were not made in regimen today. Goal BP is 130/80. Pt aware to report any  persistent high or low readings. DASH diet and exercise encouraged. Exercise at least 150 minutes per week and increase as tolerated. Goal BMI > 25. Stress management encouraged. Avoid nicotine and tobacco product use. Avoid excessive alcohol and NSAID's. Avoid more than 2000 mg of sodium daily. Medications as prescribed. Follow up as scheduled.  -     losartan (COZAAR) 100 MG tablet; Take 1 tablet (100 mg total) by mouth daily.  Hyperlipidemia associated with type 2 diabetes mellitus (HCC) Diet encouraged - increase intake of fresh fruits and vegetables, increase intake of lean proteins. Bake, broil, or grill foods. Avoid fried, greasy, and fatty foods. Avoid fast foods. Increase intake of fiber-rich whole grains. Exercise encouraged - at least 150 minutes per week and advance as tolerated.  Goal BMI < 25. Continue medications as prescribed. Follow up in 3-6 months as discussed.   History of non-ST elevation myocardial infarction (NSTEMI) Followed by cardiologist on a regular basis. No anginal symptoms. Continue current medications.     Continue all other maintenance medications.  Follow up plan: Return in about 4 months (around 09/13/2023) for CPE, DM.   Continue healthy lifestyle choices, including diet (rich in fruits, vegetables, and lean proteins, and low in salt and simple carbohydrates) and exercise (at least 30 minutes of moderate  physical activity daily).  Educational handout given for DM  The above assessment and management plan was discussed with the patient. The patient verbalized understanding of and has agreed to the management plan. Patient is aware to call the clinic if they develop any new symptoms or if symptoms persist or worsen. Patient is aware when to return to the clinic for a follow-up visit. Patient educated on when it is appropriate to go to the emergency department.   Kari Baars, FNP-C [facility] [phone]

## 2023-05-29 ENCOUNTER — Other Ambulatory Visit (HOSPITAL_COMMUNITY): Payer: Self-pay

## 2023-06-22 ENCOUNTER — Other Ambulatory Visit: Payer: Self-pay | Admitting: Family Medicine

## 2023-06-22 ENCOUNTER — Other Ambulatory Visit: Payer: Self-pay | Admitting: Cardiovascular Disease

## 2023-06-22 ENCOUNTER — Other Ambulatory Visit (HOSPITAL_COMMUNITY): Payer: Self-pay

## 2023-06-22 DIAGNOSIS — E1169 Type 2 diabetes mellitus with other specified complication: Secondary | ICD-10-CM

## 2023-06-22 DIAGNOSIS — E1159 Type 2 diabetes mellitus with other circulatory complications: Secondary | ICD-10-CM

## 2023-06-22 DIAGNOSIS — E782 Mixed hyperlipidemia: Secondary | ICD-10-CM

## 2023-07-22 ENCOUNTER — Other Ambulatory Visit: Payer: Self-pay

## 2023-07-29 ENCOUNTER — Encounter: Payer: Self-pay | Admitting: Family Medicine

## 2023-07-30 NOTE — Telephone Encounter (Signed)
Letter written and sent to patient.

## 2023-08-04 ENCOUNTER — Other Ambulatory Visit: Payer: Self-pay | Admitting: Family Medicine

## 2023-08-04 DIAGNOSIS — E1141 Type 2 diabetes mellitus with diabetic mononeuropathy: Secondary | ICD-10-CM

## 2023-08-25 ENCOUNTER — Other Ambulatory Visit: Payer: Self-pay | Admitting: Family Medicine

## 2023-08-25 DIAGNOSIS — E1141 Type 2 diabetes mellitus with diabetic mononeuropathy: Secondary | ICD-10-CM

## 2023-09-08 ENCOUNTER — Other Ambulatory Visit: Payer: Self-pay | Admitting: Family Medicine

## 2023-09-08 DIAGNOSIS — E1141 Type 2 diabetes mellitus with diabetic mononeuropathy: Secondary | ICD-10-CM

## 2023-09-09 ENCOUNTER — Other Ambulatory Visit (HOSPITAL_COMMUNITY): Payer: Self-pay

## 2023-09-09 ENCOUNTER — Other Ambulatory Visit: Payer: Self-pay

## 2023-09-09 MED ORDER — OZEMPIC (2 MG/DOSE) 8 MG/3ML ~~LOC~~ SOPN
2.0000 mg | PEN_INJECTOR | SUBCUTANEOUS | 6 refills | Status: DC
  Filled 2023-09-09: qty 3, 28d supply, fill #0
  Filled 2023-10-03: qty 3, 28d supply, fill #1
  Filled 2023-10-31: qty 3, 28d supply, fill #2
  Filled 2023-12-06: qty 3, 28d supply, fill #3

## 2023-09-19 ENCOUNTER — Ambulatory Visit: Payer: 59 | Admitting: Family Medicine

## 2023-09-19 VITALS — BP 131/78 | HR 67 | Temp 97.4°F | Ht 67.0 in | Wt 137.2 lb

## 2023-09-19 DIAGNOSIS — N138 Other obstructive and reflux uropathy: Secondary | ICD-10-CM

## 2023-09-19 DIAGNOSIS — E785 Hyperlipidemia, unspecified: Secondary | ICD-10-CM

## 2023-09-19 DIAGNOSIS — E1159 Type 2 diabetes mellitus with other circulatory complications: Secondary | ICD-10-CM

## 2023-09-19 DIAGNOSIS — E1141 Type 2 diabetes mellitus with diabetic mononeuropathy: Secondary | ICD-10-CM

## 2023-09-19 DIAGNOSIS — Z0001 Encounter for general adult medical examination with abnormal findings: Secondary | ICD-10-CM

## 2023-09-19 DIAGNOSIS — Z125 Encounter for screening for malignant neoplasm of prostate: Secondary | ICD-10-CM | POA: Diagnosis not present

## 2023-09-19 DIAGNOSIS — N401 Enlarged prostate with lower urinary tract symptoms: Secondary | ICD-10-CM

## 2023-09-19 DIAGNOSIS — Z7984 Long term (current) use of oral hypoglycemic drugs: Secondary | ICD-10-CM

## 2023-09-19 DIAGNOSIS — Z0000 Encounter for general adult medical examination without abnormal findings: Secondary | ICD-10-CM

## 2023-09-19 DIAGNOSIS — Z951 Presence of aortocoronary bypass graft: Secondary | ICD-10-CM

## 2023-09-19 DIAGNOSIS — E1169 Type 2 diabetes mellitus with other specified complication: Secondary | ICD-10-CM

## 2023-09-19 LAB — BAYER DCA HB A1C WAIVED: HB A1C (BAYER DCA - WAIVED): 7.4 % — ABNORMAL HIGH (ref 4.8–5.6)

## 2023-09-19 MED ORDER — ATORVASTATIN CALCIUM 40 MG PO TABS: 40.0000 mg | ORAL_TABLET | Freq: Every day | ORAL | 3 refills | Status: DC

## 2023-09-19 MED ORDER — LOSARTAN POTASSIUM 100 MG PO TABS: 100.0000 mg | ORAL_TABLET | Freq: Every day | ORAL | 3 refills | Status: DC

## 2023-09-19 MED ORDER — DAPAGLIFLOZIN PROPANEDIOL 10 MG PO TABS: 10.0000 mg | ORAL_TABLET | Freq: Every day | ORAL | 2 refills | Status: DC
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
## 2023-09-19 NOTE — Patient Instructions (Addendum)

## 2023-09-20 LAB — CMP14+EGFR
ALT: 30 [IU]/L (ref 0–44)
AST: 25 [IU]/L (ref 0–40)
Albumin: 4.7 g/dL (ref 3.8–4.9)
Alkaline Phosphatase: 67 [IU]/L (ref 44–121)
BUN/Creatinine Ratio: 21 — ABNORMAL HIGH (ref 9–20)
BUN: 20 mg/dL (ref 6–24)
Bilirubin Total: 2 mg/dL — ABNORMAL HIGH (ref 0.0–1.2)
CO2: 22 mmol/L (ref 20–29)
Calcium: 9.7 mg/dL (ref 8.7–10.2)
Chloride: 101 mmol/L (ref 96–106)
Creatinine, Ser: 0.95 mg/dL (ref 0.76–1.27)
Globulin, Total: 2.2 g/dL (ref 1.5–4.5)
Glucose: 106 mg/dL — ABNORMAL HIGH (ref 70–99)
Potassium: 4.5 mmol/L (ref 3.5–5.2)
Sodium: 140 mmol/L (ref 134–144)
Total Protein: 6.9 g/dL (ref 6.0–8.5)
eGFR: 95 mL/min/{1.73_m2} (ref >59)

## 2023-09-20 LAB — CBC WITH DIFFERENTIAL/PLATELET
Basophils Absolute: 0 10*3/uL (ref 0.0–0.2)
Basos: 0 %
EOS (ABSOLUTE): 0.1 10*3/uL (ref 0.0–0.4)
Eos: 1 %
Hematocrit: 48.9 % (ref 37.5–51.0)
Hemoglobin: 16.6 g/dL (ref 13.0–17.7)
Immature Grans (Abs): 0 10*3/uL (ref 0.0–0.1)
Immature Granulocytes: 0 %
Lymphocytes Absolute: 2 10*3/uL (ref 0.7–3.1)
Lymphs: 23 %
MCH: 30.5 pg (ref 26.6–33.0)
MCHC: 33.9 g/dL (ref 31.5–35.7)
MCV: 90 fL (ref 79–97)
Monocytes Absolute: 0.7 10*3/uL (ref 0.1–0.9)
Monocytes: 7 %
Neutrophils Absolute: 6.1 10*3/uL (ref 1.4–7.0)
Neutrophils: 69 %
Platelets: 245 10*3/uL (ref 150–450)
RBC: 5.44 x10E6/uL (ref 4.14–5.80)
RDW: 12.5 % (ref 11.6–15.4)
WBC: 8.9 10*3/uL (ref 3.4–10.8)

## 2023-09-20 LAB — LIPID PANEL
Chol/HDL Ratio: 1.7 {ratio} (ref 0.0–5.0)
Cholesterol, Total: 82 mg/dL — ABNORMAL LOW (ref 100–199)
HDL: 47 mg/dL (ref >39)
LDL Chol Calc (NIH): 22 mg/dL (ref 0–99)
Triglycerides: 54 mg/dL (ref 0–149)
VLDL Cholesterol Cal: 13 mg/dL (ref 5–40)

## 2023-09-20 LAB — PSA, TOTAL AND FREE
PSA, Free Pct: 22.5 %
PSA, Free: 0.27 ng/mL
Prostate Specific Ag, Serum: 1.2 ng/mL (ref 0.0–4.0)

## 2023-09-20 LAB — MICROALBUMIN / CREATININE URINE RATIO
Creatinine, Urine: 145.3 mg/dL
Microalb/Creat Ratio: 2 mg/g{creat} (ref 0–29)
Microalbumin, Urine: 3.6 ug/mL

## 2023-09-20 LAB — VITAMIN B12: Vitamin B-12: 684 pg/mL (ref 232–1245)

## 2023-10-03 ENCOUNTER — Other Ambulatory Visit (HOSPITAL_COMMUNITY): Payer: Self-pay

## 2023-11-01 ENCOUNTER — Other Ambulatory Visit: Payer: Self-pay

## 2023-11-01 ENCOUNTER — Other Ambulatory Visit: Payer: Self-pay | Admitting: Family Medicine

## 2023-11-01 DIAGNOSIS — E1141 Type 2 diabetes mellitus with diabetic mononeuropathy: Secondary | ICD-10-CM

## 2023-11-04 ENCOUNTER — Encounter: Payer: Self-pay | Admitting: Family Medicine

## 2023-12-06 ENCOUNTER — Other Ambulatory Visit (HOSPITAL_COMMUNITY): Payer: Self-pay

## 2024-01-20 ENCOUNTER — Encounter: Payer: Self-pay | Admitting: Family Medicine

## 2024-01-20 DIAGNOSIS — E1141 Type 2 diabetes mellitus with diabetic mononeuropathy: Secondary | ICD-10-CM

## 2024-01-21 MED ORDER — OZEMPIC (2 MG/DOSE) 8 MG/3ML ~~LOC~~ SOPN: 2.0000 mg | PEN_INJECTOR | SUBCUTANEOUS | 0 refills | Status: DC

## 2024-01-22 ENCOUNTER — Other Ambulatory Visit (HOSPITAL_COMMUNITY): Payer: Self-pay

## 2024-01-24 ENCOUNTER — Telehealth: Payer: Self-pay

## 2024-01-24 NOTE — Telephone Encounter (Signed)
Patient needs PA for ozempic

## 2024-01-29 ENCOUNTER — Telehealth: Payer: Self-pay

## 2024-01-29 ENCOUNTER — Other Ambulatory Visit (HOSPITAL_COMMUNITY): Payer: Self-pay

## 2024-01-29 NOTE — Telephone Encounter (Signed)
 Pharmacy Patient Advocate Encounter   Received notification from Onbase that prior authorization for OZEMPIC  is required/requested.   Insurance verification completed.   The patient is insured through [employer] .   Per test claim: PA required; PA submitted to above mentioned insurance via Fax Key/confirmation #/[id number] Status is pending

## 2024-01-29 NOTE — Telephone Encounter (Signed)
 PA request has been Submitted. New Encounter has been or will be created for follow up. For additional info see Pharmacy Prior Auth telephone encounter from 01/29/24.

## 2024-01-31 ENCOUNTER — Other Ambulatory Visit (HOSPITAL_COMMUNITY): Payer: Self-pay

## 2024-01-31 NOTE — Telephone Encounter (Signed)
 Pharmacy Patient Advocate Encounter  Received notification from [facility] that Prior Authorization for Ozempic  2 mg/0.75 ml pen has been APPROVED from 01/01/24 to 01/30/25. Ran test claim, Copay is $24.99. This test claim was processed through [facility]- copay amounts may vary at other pharmacies due to pharmacy/plan contracts, or as the patient moves through the different stages of their insurance plan.   PA #/Case ID/Reference #: --

## 2024-02-03 ENCOUNTER — Telehealth: Admitting: Physician Assistant

## 2024-02-03 DIAGNOSIS — B379 Candidiasis, unspecified: Secondary | ICD-10-CM | POA: Diagnosis not present

## 2024-02-03 MED ORDER — NYSTATIN 100000 UNIT/GM EX CREA: 1.0000 | TOPICAL_CREAM | Freq: Two times a day (BID) | CUTANEOUS | 0 refills | Status: DC

## 2024-02-03 NOTE — Progress Notes (Signed)
E Visit for Rash  We are sorry that you are not feeling well. Here is how we plan to help!  Based upon your presentation it appears you have a fungal infection.  I have prescribed: and Nystatin cream apply to the affected area twice daily   HOME CARE:  Take cool showers and avoid direct sunlight. Apply cool compress or wet dressings. Take a bath in an oatmeal bath.  Sprinkle content of one Aveeno packet under running faucet with comfortably warm water.  Bathe for 15-20 minutes, 1-2 times daily.  Pat dry with a towel. Do not rub the rash. Use hydrocortisone cream. Take an antihistamine like Benadryl for widespread rashes that itch.  The adult dose of Benadryl is 25-50 mg by mouth 4 times daily. Caution:  This type of medication may cause sleepiness.  Do not drink alcohol, drive, or operate dangerous machinery while taking antihistamines.  Do not take these medications if you have prostate enlargement.  Read package instructions thoroughly on all medications that you take.  GET HELP RIGHT AWAY IF:  Symptoms don't go away after treatment. Severe itching that persists. If you rash spreads or swells. If you rash begins to smell. If it blisters and opens or develops a yellow-brown crust. You develop a fever. You have a sore throat. You become short of breath.  MAKE SURE YOU:  Understand these instructions. Will watch your condition. Will get help right away if you are not doing well or get worse.  Thank you for choosing an e-visit.  Your e-visit answers were reviewed by a board certified advanced clinical practitioner to complete your personal care plan. Depending upon the condition, your plan could have included both over the counter or prescription medications.  Please review your pharmacy choice. Make sure the pharmacy is open so you can pick up prescription now. If there is a problem, you may contact your provider through MyChart messaging and have the prescription routed to  another pharmacy.  Your safety is important to us. If you have drug allergies check your prescription carefully.   For the next 24 hours you can use MyChart to ask questions about today's visit, request a non-urgent call back, or ask for a work or school excuse. You will get an email in the next two days asking about your experience. I hope that your e-visit has been valuable and will speed your recovery.  I have spent 5 minutes in review of e-visit questionnaire, review and updating patient chart, medical decision making and response to patient.   Sherrol Vicars M Tamarick Kovalcik, PA-C  

## 2024-02-19 ENCOUNTER — Other Ambulatory Visit: Payer: Self-pay

## 2024-02-19 DIAGNOSIS — E782 Mixed hyperlipidemia: Secondary | ICD-10-CM

## 2024-02-19 MED ORDER — EZETIMIBE 10 MG PO TABS: 10.0000 mg | ORAL_TABLET | Freq: Every day | ORAL | 0 refills | Status: DC

## 2024-02-19 MED ORDER — METOPROLOL TARTRATE 25 MG PO TABS: 25.0000 mg | ORAL_TABLET | Freq: Two times a day (BID) | ORAL | 0 refills | Status: DC

## 2024-03-02 ENCOUNTER — Encounter: Payer: Self-pay | Admitting: Family Medicine

## 2024-03-02 ENCOUNTER — Other Ambulatory Visit: Payer: Self-pay

## 2024-03-02 DIAGNOSIS — E1141 Type 2 diabetes mellitus with diabetic mononeuropathy: Secondary | ICD-10-CM

## 2024-03-02 MED ORDER — METFORMIN HCL ER 500 MG PO TB24
1000.0000 mg | ORAL_TABLET | Freq: Every day | ORAL | 1 refills | Status: DC
Start: 2024-03-02 — End: 2024-04-27

## 2024-03-24 ENCOUNTER — Ambulatory Visit: Payer: Self-pay | Admitting: Family Medicine

## 2024-03-24 ENCOUNTER — Ambulatory Visit: Payer: 59 | Admitting: Family Medicine

## 2024-03-24 ENCOUNTER — Ambulatory Visit (INDEPENDENT_AMBULATORY_CARE_PROVIDER_SITE_OTHER): Admitting: Family Medicine

## 2024-03-24 ENCOUNTER — Encounter: Payer: Self-pay | Admitting: Family Medicine

## 2024-03-24 VITALS — BP 161/91 | HR 64 | Temp 97.0°F | Ht 67.0 in | Wt 138.4 lb

## 2024-03-24 DIAGNOSIS — E1159 Type 2 diabetes mellitus with other circulatory complications: Secondary | ICD-10-CM

## 2024-03-24 DIAGNOSIS — E1141 Type 2 diabetes mellitus with diabetic mononeuropathy: Secondary | ICD-10-CM | POA: Diagnosis not present

## 2024-03-24 DIAGNOSIS — I152 Hypertension secondary to endocrine disorders: Secondary | ICD-10-CM

## 2024-03-24 DIAGNOSIS — L989 Disorder of the skin and subcutaneous tissue, unspecified: Secondary | ICD-10-CM

## 2024-03-24 DIAGNOSIS — E785 Hyperlipidemia, unspecified: Secondary | ICD-10-CM

## 2024-03-24 DIAGNOSIS — Z7984 Long term (current) use of oral hypoglycemic drugs: Secondary | ICD-10-CM

## 2024-03-24 DIAGNOSIS — E1169 Type 2 diabetes mellitus with other specified complication: Secondary | ICD-10-CM

## 2024-03-24 LAB — BAYER DCA HB A1C WAIVED: HB A1C (BAYER DCA - WAIVED): 7.9 % — ABNORMAL HIGH (ref 4.8–5.6)

## 2024-03-24 LAB — CMP14+EGFR
ALT: 29 IU/L (ref 0–44)
AST: 23 IU/L (ref 0–40)
Albumin: 4.4 g/dL (ref 3.8–4.9)
Alkaline Phosphatase: 58 IU/L (ref 44–121)
BUN/Creatinine Ratio: 10 (ref 9–20)
BUN: 10 mg/dL (ref 6–24)
Bilirubin Total: 1.5 mg/dL — ABNORMAL HIGH (ref 0.0–1.2)
CO2: 22 mmol/L (ref 20–29)
Calcium: 9.3 mg/dL (ref 8.7–10.2)
Chloride: 103 mmol/L (ref 96–106)
Creatinine, Ser: 0.96 mg/dL (ref 0.76–1.27)
Globulin, Total: 2.1 g/dL (ref 1.5–4.5)
Glucose: 125 mg/dL — ABNORMAL HIGH (ref 70–99)
Potassium: 4.2 mmol/L (ref 3.5–5.2)
Sodium: 141 mmol/L (ref 134–144)
Total Protein: 6.5 g/dL (ref 6.0–8.5)
eGFR: 93 mL/min/1.73 (ref >59)

## 2024-03-24 MED ORDER — DAPAGLIFLOZIN PROPANEDIOL 10 MG PO TABS
10.0000 mg | ORAL_TABLET | Freq: Every day | ORAL | 2 refills | Status: AC
Start: 2024-03-24 — End: ?

## 2024-03-24 NOTE — Progress Notes (Signed)
 Subjective:  Patient ID: Matthew Mann, male    DOB: 07/19/1966, 58 y.o.   MRN: [id number]  Patient Care Team: Severa Rock HERO, FNP as PCP - General (Family Medicine) O'Neal, Darryle Ned, MD as PCP - Cardiology (Cardiology) [facility], Josefa HERO, NP as Nurse Practitioner (Cardiology)   Chief Complaint:  Diabetes (6 month follow up )   HPI: Matthew Mann is a 58 y.o. male presenting on 03/24/2024 for Diabetes (6 month follow up )   Matthew Mann is a 58 year old male with diabetes and hypertension who presents for routine follow-up.  He is managing his diabetes with Ozempic  2 mg, Farxiga , and metformin  1000 mg daily. He is awaiting blood work results to assess his diabetes control. No increased hunger, thirst, or urination. His last blood sugar reading was 115 mg/dL in the morning. He reports having some dietary indiscretions recently due to birthday parties.  For hypertension, he is taking losartan  and metoprolol . His blood pressure was slightly elevated today but is not usually high. He checks his blood pressure at home only when he feels 'a little off'. No headaches, chest pain, leg swelling, dizziness, or changes in vision.  He has a lesion on his hip that has been present for six to seven months, described as sometimes oozing and getting crusty. He has not seen a dermatologist for this issue yet.  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 He is looking forward to this change and has a retreat planned with his wife on August 19th to meet with the [facility] for support.  He has an upcoming eye exam scheduled for this Friday and a cardiology appointment on August 28th. His cardiology appointments are typically every six months, but scheduling issues have delayed his next visit.          Relevant past medical, surgical, family, and social history reviewed and updated as indicated.  Allergies and  medications reviewed and updated. Data reviewed: Chart in Epic.   Past Medical History:  Diagnosis Date   Diabetes mellitus without complication (HCC)    Hyperlipidemia    Hypertension     Past Surgical History:  Procedure Laterality Date   CORONARY ARTERY BYPASS GRAFT N/A 06/19/2021   Procedure: CORONARY ARTERY BYPASS GRAFTING TIMES 5;  Surgeon: Shyrl Linnie KIDD, MD;  Location: [facility] OR;  Service: Open Heart Surgery;  Laterality: N/A;   ENDOVEIN HARVEST OF GREATER SAPHENOUS VEIN Right 06/19/2021   Procedure: ENDOVEIN HARVEST OF GREATER SAPHENOUS VEIN;  Surgeon: Shyrl Linnie KIDD, MD;  Location: [facility] OR;  Service: Open Heart Surgery;  Laterality: Right;   LEFT HEART CATH AND CORONARY ANGIOGRAPHY N/A 06/14/2021   Procedure: LEFT HEART CATH AND CORONARY ANGIOGRAPHY;  Surgeon: Wonda Sharper, MD;  Location: [facility] INVASIVE CV LAB;  Service: Cardiovascular;  Laterality: N/A;   RADIAL ARTERY HARVEST Left 06/19/2021   Procedure: RADIAL ARTERY HARVEST;  Surgeon: Shyrl Linnie KIDD, MD;  Location: [facility] OR;  Service: Open Heart Surgery;  Laterality: Left;   TEE WITHOUT CARDIOVERSION N/A 06/19/2021   Procedure: TRANSESOPHAGEAL ECHOCARDIOGRAM (TEE);  Surgeon: Shyrl Linnie KIDD, MD;  Location: [facility] OR;  Service: Open Heart Surgery;  Laterality: N/A;    Social History   Socioeconomic History   Marital status: Married    Spouse name: Not on file   Number of children: Not on file   Years of education: Not on file  Highest education level: Master's degree (e.g., MA, MS, MEng, MEd, MSW, MBA)  Occupational History   Not on file  Tobacco Use   Smoking status: Never   Smokeless tobacco: Never  Vaping Use   Vaping status: Never Used  Substance and Sexual Activity   Alcohol use: Never   Drug use: Never   Sexual activity: Yes  Other Topics Concern   Not on file  Social History Narrative   Not on file   Social Drivers of Health   Financial Resource Strain: Low Risk  (03/23/2024)   Overall  Financial Resource Strain (CARDIA)    Difficulty of Paying Living Expenses: Not hard at all  Food Insecurity: No Food Insecurity (03/23/2024)   Hunger Vital Sign    Worried About Running Out of Food in the Last Year: Never true    Ran Out of Food in the Last Year: Never true  Transportation Needs: No Transportation Needs (03/23/2024)   PRAPARE - Administrator, Civil Service (Medical): No    Lack of Transportation (Non-Medical): No  Physical Activity: Sufficiently Active (03/23/2024)   Exercise Vital Sign    Days of Exercise per Week: 5 days    Minutes of Exercise per Session: 40 min  Stress: No Stress Concern Present (03/23/2024)   [employer] of Occupational Health - Occupational Stress Questionnaire    Feeling of Stress: Not at all  Social Connections: Moderately Integrated (03/23/2024)   Social Connection and Isolation Panel    Frequency of Communication with Friends and Family: Once a week    Frequency of Social Gatherings with Friends and Family: Once a week    Attends Religious Services: More than 4 times per year    Active Member of [employer] or Organizations: Yes    Attends Banker Meetings: More than 4 times per year    Marital Status: Married  Catering manager Violence: Unknown (12/04/2021)   Received from [facility]   HITS    Physically Hurt: Not on file    Insult or Talk Down To: Not on file    Threaten Physical Harm: Not on file    Scream or Curse: Not on file    Outpatient Encounter Medications as of 03/24/2024  Medication Sig   aspirin  81 MG EC tablet Take 1 tablet by mouth daily.   atorvastatin  (LIPITOR ) 40 MG tablet Take 1 tablet (40 mg total) by mouth daily.   ezetimibe  (ZETIA ) 10 MG tablet Take 1 tablet (10 mg total) by mouth daily.   losartan  (COZAAR ) 100 MG tablet Take 1 tablet (100 mg total) by mouth daily.   metFORMIN  (GLUCOPHAGE -XR) 500 MG 24 hr tablet Take 2 tablets (1,000 mg total) by mouth daily with breakfast.   metoprolol   tartrate (LOPRESSOR ) 25 MG tablet Take 1 tablet (25 mg total) by mouth 2 (two) times daily.   Multiple Vitamins-Minerals (MULTIVITAMIN WITH MINERALS) tablet Take 2 tablets by mouth daily.   Semaglutide , 2 MG/DOSE, (OZEMPIC , 2 MG/DOSE,) 8 MG/3ML SOPN Inject 2 mg into the skin once a week.   [DISCONTINUED] dapagliflozin  propanediol (FARXIGA ) 10 MG TABS tablet Take 1 tablet (10 mg total) by mouth daily.   [DISCONTINUED] nystatin  cream (MYCOSTATIN ) Apply 1 Application topically 2 (two) times daily.   dapagliflozin  propanediol (FARXIGA ) 10 MG TABS tablet Take 1 tablet (10 mg total) by mouth daily.   No facility-administered encounter medications on file as of 03/24/2024.    Allergies  Allergen Reactions   Ace Inhibitors Other (See Comments), Swelling  and Cough    Tongue swelling and cough.   Tropicamide Nausea Only and Nausea And Vomiting    Pertinent ROS per HPI, otherwise unremarkable      Objective:  BP (!) 161/91   Pulse 64   Temp (!) 97 F (36.1 C)   Ht 5' 7 (1.702 m)   Wt 138 lb 6.4 oz (62.8 kg)   SpO2 100%   BMI 21.68 kg/m    Wt Readings from Last 3 Encounters:  03/24/24 138 lb 6.4 oz (62.8 kg)  09/19/23 137 lb 3.2 oz (62.2 kg)  05/14/23 143 lb 12.8 oz (65.2 kg)    Physical Exam Vitals and nursing note reviewed.  Constitutional:      General: He is not in acute distress.    Appearance: Normal appearance. He is normal weight. He is not ill-appearing, toxic-appearing or diaphoretic.  HENT:     Head: Normocephalic and atraumatic.     Nose: Nose normal.     Mouth/Throat:     Mouth: Mucous membranes are moist.  Eyes:     Conjunctiva/sclera: Conjunctivae normal.     Pupils: Pupils are equal, round, and reactive to light.  Cardiovascular:     Rate and Rhythm: Normal rate and regular rhythm.     Pulses:          Dorsalis pedis pulses are 2+ on the right side and 2+ on the left side.       Posterior tibial pulses are 2+ on the right side and 2+ on the left side.      Heart sounds: Normal heart sounds.  Pulmonary:     Effort: Pulmonary effort is normal.     Breath sounds: Normal breath sounds.  Musculoskeletal:     Cervical back: Neck supple.     Right lower leg: No edema.     Left lower leg: No edema.  Feet:     Right foot:     Protective Sensation: 10 sites tested.  10 sites sensed.     Skin integrity: Skin integrity normal.     Toenail Condition: Right toenails are normal.     Left foot:     Protective Sensation: 10 sites tested.  10 sites sensed.     Skin integrity: Skin integrity normal.     Toenail Condition: Left toenails are normal.  Skin:    General: Skin is warm and dry.     Capillary Refill: Capillary refill takes less than 2 seconds.     Findings: Lesion present.      Neurological:     General: No focal deficit present.     Mental Status: He is alert and oriented to person, place, and time.  Psychiatric:        Mood and Affect: Mood normal.        Behavior: Behavior normal.        Thought Content: Thought content normal.        Judgment: Judgment normal.    Physical Exam   SKIN: Lesion on hip, present for 6-7 months, sometimes oozes and crusty.        Results for orders placed or performed in visit on 09/19/23  Bayer DCA Hb A1c Waived   Collection Time: 09/19/23  9:58 AM  Result Value Ref Range   HB A1C (BAYER DCA - WAIVED) 7.4 (H) 4.8 - 5.6 %  CMP14+EGFR   Collection Time: 09/19/23 10:00 AM  Result Value Ref Range   Glucose 106 (H) 70 - 99  mg/dL   BUN 20 6 - 24 mg/dL   Creatinine, Ser 9.04 0.76 - 1.27 mg/dL   eGFR 95 >40 fO/fpw/8.26   BUN/Creatinine Ratio 21 (H) 9 - 20   Sodium 140 134 - 144 mmol/L   Potassium 4.5 3.5 - 5.2 mmol/L   Chloride 101 96 - 106 mmol/L   CO2 22 20 - 29 mmol/L   Calcium  9.7 8.7 - 10.2 mg/dL   Total Protein 6.9 6.0 - 8.5 g/dL   Albumin  4.7 3.8 - 4.9 g/dL   Globulin, Total 2.2 1.5 - 4.5 g/dL   Bilirubin Total 2.0 (H) 0.0 - 1.2 mg/dL   Alkaline Phosphatase 67 44 - 121 IU/L   AST 25 0 -  40 IU/L   ALT 30 0 - 44 IU/L  CBC with Differential/Platelet   Collection Time: 09/19/23 10:00 AM  Result Value Ref Range   WBC 8.9 3.4 - 10.8 x10E3/uL   RBC 5.44 4.14 - 5.80 x10E6/uL   Hemoglobin 16.6 13.0 - 17.7 g/dL   Hematocrit 51.0 62.4 - 51.0 %   MCV 90 79 - 97 fL   MCH 30.5 26.6 - 33.0 pg   MCHC 33.9 31.5 - 35.7 g/dL   RDW 87.4 88.3 - 84.5 %   Platelets 245 150 - 450 x10E3/uL   Neutrophils 69 Not Estab. %   Lymphs 23 Not Estab. %   Monocytes 7 Not Estab. %   Eos 1 Not Estab. %   Basos 0 Not Estab. %   Neutrophils Absolute 6.1 1.4 - 7.0 x10E3/uL   Lymphocytes Absolute 2.0 0.7 - 3.1 x10E3/uL   Monocytes Absolute 0.7 0.1 - 0.9 x10E3/uL   EOS (ABSOLUTE) 0.1 0.0 - 0.4 x10E3/uL   Basophils Absolute 0.0 0.0 - 0.2 x10E3/uL   Immature Granulocytes 0 Not Estab. %   Immature Grans (Abs) 0.0 0.0 - 0.1 x10E3/uL  Vitamin B12   Collection Time: 09/19/23 10:00 AM  Result Value Ref Range   Vitamin B-12 684 232 - 1,245 pg/mL  PSA, total and free   Collection Time: 09/19/23 10:00 AM  Result Value Ref Range   Prostate Specific Ag, Serum 1.2 0.0 - 4.0 ng/mL   PSA, Free 0.27 N/A ng/mL   PSA, Free Pct 22.5 %  Lipid panel   Collection Time: 09/19/23 10:00 AM  Result Value Ref Range   Cholesterol, Total 82 (L) 100 - 199 mg/dL   Triglycerides 54 0 - 149 mg/dL   HDL 47 >60 mg/dL   VLDL Cholesterol Cal 13 5 - 40 mg/dL   LDL Chol Calc (NIH) 22 0 - 99 mg/dL   Chol/HDL Ratio 1.7 0.0 - 5.0 ratio  Microalbumin / creatinine urine ratio   Collection Time: 09/19/23 10:26 AM  Result Value Ref Range   Creatinine, Urine 145.3 Not Estab. mg/dL   Microalbumin, Urine 3.6 Not Estab. ug/mL   Microalb/Creat Ratio 2 0 - 29 mg/g creat       Pertinent labs & imaging results that were available during my care of the patient were reviewed by me and considered in my medical decision making.  Assessment & Plan:  Matthew Mann was seen today for diabetes.  Diagnoses and all orders for this  visit:  Type 2 diabetes mellitus with diabetic mononeuropathy, without long-term current use of insulin  (HCC) -     Bayer DCA Hb A1c Waived -     CMP14+EGFR -     dapagliflozin  propanediol (FARXIGA ) 10 MG TABS tablet; Take 1 tablet (10 mg  total) by mouth daily.  Hypertension associated with diabetes (HCC) -     Bayer DCA Hb A1c Waived -     CMP14+EGFR  Hyperlipidemia associated with type 2 diabetes mellitus (HCC) -     Bayer DCA Hb A1c Waived -     CMP14+EGFR  Skin lesion -     Ambulatory referral to Dermatology      Type 2 Diabetes Mellitus Type 2 Diabetes Mellitus with recent A1c of 7.9%, indicating suboptimal control. He is on Ozempic , Farxiga , and metformin . Recent dietary indiscretions may contribute to elevated A1c. No symptoms of polyphagia, polydipsia, or polyuria reported. Morning blood glucose was 115 mg/dL. Emphasis on dietary management to achieve glycemic control, especially given cardiac history. Stress and illness may also contribute to glucose variability. - Continue Ozempic , Farxiga , and metformin  as prescribed. - Monitor blood glucose levels throughout the day to identify spikes. - Focus on dietary modifications to maintain blood glucose levels below 150 mg/dL. - Reassess if blood glucose levels remain above 150 mg/dL regularly. - Refill Farxiga  prescription through [facility].  Hypertension Hypertension with recent elevation in blood pressure, though typically well-controlled. Managed with losartan  and metoprolol . No symptoms of cephalalgia, angina, or peripheral edema reported. Advised to monitor salt intake and blood pressure, especially with upcoming cardiology appointment. Blood pressure elevation may be transient due to stress or dietary factors. - Continue losartan  and metoprolol  as prescribed. - Monitor salt intake and blood pressure regularly. - Follow up with cardiologist on April 30, 2024.  Suspected Basal Cell Carcinoma Lesion on hip present for  6-7 months, sometimes oozing and crusty. Suspected basal cell carcinoma. No prior dermatology evaluation. Referral to [facility] for further assessment. Possible management includes cryotherapy or excision depending on evaluation findings. - Referral to [facility] for evaluation and management. - Keep lesion clean and dry. Avoid manipulation of the lesion. - Contact [facility] if no response by mid-next week regarding referral.  General Health Maintenance Routine eye exam scheduled for this Friday. - Attend scheduled eye exam.  Follow-up Follow-up plans discussed for diabetes management and dermatology referral. - Follow up with primary care in 5-6 months unless blood glucose levels remain elevated.          Continue all other maintenance medications.  Follow up plan: Return in about 6 months (around 09/24/2024) for Annual Physical.   Continue healthy lifestyle choices, including diet (rich in fruits, vegetables, and lean proteins, and low in salt and simple carbohydrates) and exercise (at least 30 minutes of moderate physical activity daily).  Educational handout given for DM, DASH  The above assessment and management plan was discussed with the patient. The patient verbalized understanding of and has agreed to the management plan. Patient is aware to call the clinic if they develop any new symptoms or if symptoms persist or worsen. Patient is aware when to return to the clinic for a follow-up visit. Patient educated on when it is appropriate to go to the emergency department.   Rosaline Bruns, FNP-C [facility] [phone]

## 2024-03-24 NOTE — Patient Instructions (Addendum)
 Continue to monitor your blood sugars as we discussed and record them. Bring the log to your next appointment.  Take your medications as directed.    Goal Blood glucose:    Fasting (before meals) = 80 to 130   Within 2 hours of eating = less than 180   Understanding your Hemoglobin A1c: 7.9Goal BP:  For patients younger than 60: Goal BP < 140/90. For patients 60 and older: Goal BP < 150/90. For patients with diabetes: Goal BP < 140/90.  Take your medications faithfully as prescribed. Maintain a healthy weight. Get at least 150 minutes of aerobic exercise per week. Minimize salt intake, less than 2000 mg per day. Minimize alcohol intake.  DASH Eating Plan DASH stands for Dietary Approaches to Stop Hypertension. The DASH eating plan is a healthy eating plan that has been shown to reduce high blood pressure (hypertension). Additional health benefits may include reducing the risk of type 2 diabetes mellitus, heart disease, and stroke. The DASH eating plan may also help with weight loss.  WHAT DO I NEED TO KNOW ABOUT THE DASH EATING PLAN? For the DASH eating plan, you will follow these general guidelines: Choose foods with a percent daily value for sodium of less than 5% (as listed on the food label). Use salt-free seasonings or herbs instead of table salt or sea salt. Check with your health care provider or pharmacist before using salt substitutes. Eat lower-sodium products, often labeled as lower sodium or no salt added. Eat fresh foods. Eat more vegetables, fruits, and low-fat dairy products. Choose whole grains. Look for the word whole as the first word in the ingredient list. Choose fish and skinless chicken or [place] more often than red meat. Limit fish, poultry, and meat to 6 oz (170 g) each day. Limit sweets, desserts, sugars, and sugary drinks. Choose heart-healthy fats. Limit cheese to 1 oz (28 g) per day. Eat more home-cooked food and less restaurant, buffet, and fast  food. Limit fried foods. Cook foods using methods other than frying. Limit canned vegetables. If you do use them, rinse them well to decrease the sodium. When eating at a restaurant, ask that your food be prepared with less salt, or no salt if possible.  WHAT FOODS CAN I EAT? Seek help from a dietitian for individual calorie needs.  Grains Whole grain or whole wheat bread. Brown rice. Whole grain or whole wheat pasta. Quinoa, bulgur, and whole grain cereals. Low-sodium cereals. Corn or whole wheat flour tortillas. Whole grain cornbread. Whole grain crackers. Low-sodium crackers.  Vegetables Fresh or frozen vegetables (raw, steamed, roasted, or grilled). Low-sodium or reduced-sodium tomato and vegetable juices. Low-sodium or reduced-sodium tomato sauce and paste. Low-sodium or reduced-sodium canned vegetables.   Fruits All fresh, canned (in natural juice), or frozen fruits.  Meat and Other Protein Products Ground beef (85% or leaner), grass-fed beef, or beef trimmed of fat. Skinless chicken or [place]. Ground chicken or [place]. Pork trimmed of fat. All fish and seafood. Eggs. Dried beans, peas, or lentils. Unsalted nuts and seeds. Unsalted canned beans.  Dairy Low-fat dairy products, such as skim or 1% milk, 2% or reduced-fat cheeses, low-fat ricotta or cottage cheese, or plain low-fat yogurt. Low-sodium or reduced-sodium cheeses.  Fats and Oils Tub margarines without trans fats. Light or reduced-fat mayonnaise and salad dressings (reduced sodium). Avocado. Safflower, olive, or canola oils. Natural peanut or almond butter.  Other Unsalted popcorn and pretzels. The items listed above may not be a complete list of recommended foods  or beverages. Contact your dietitian for more options.  WHAT FOODS ARE NOT RECOMMENDED?  Grains White bread. White pasta. White rice. Refined cornbread. Bagels and croissants. Crackers that contain trans fat.  Vegetables Creamed or fried vegetables.  Vegetables in a cheese sauce. Regular canned vegetables. Regular canned tomato sauce and paste. Regular tomato and vegetable juices.  Fruits Dried fruits. Canned fruit in light or heavy syrup. Fruit juice.  Meat and Other Protein Products Fatty cuts of meat. Ribs, chicken wings, bacon, sausage, bologna, salami, chitterlings, fatback, hot dogs, bratwurst, and packaged luncheon meats. Salted nuts and seeds. Canned beans with salt.  Dairy Whole or 2% milk, cream, half-and-half, and cream cheese. Whole-fat or sweetened yogurt. Full-fat cheeses or blue cheese. Nondairy creamers and whipped toppings. Processed cheese, cheese spreads, or cheese curds.  Condiments Onion and garlic salt, seasoned salt, table salt, and sea salt. Canned and packaged gravies. Worcestershire sauce. Tartar sauce. Barbecue sauce. Teriyaki sauce. Soy sauce, including reduced sodium. Steak sauce. Fish sauce. Oyster sauce. Cocktail sauce. Horseradish. Ketchup and mustard. Meat flavorings and tenderizers. Bouillon cubes. Hot sauce. Tabasco sauce. Marinades. Taco seasonings. Relishes.  Fats and Oils Butter, stick margarine, lard, shortening, ghee, and bacon fat. Coconut, palm kernel, or palm oils. Regular salad dressings.  Other Pickles and olives. Salted popcorn and pretzels.  The items listed above may not be a complete list of foods and beverages to avoid. Contact your dietitian for more information.  WHERE CAN I FIND MORE INFORMATION? National Heart, Lung, and Blood Institute: CablePromo.it Document Released: 08/09/2011 Document Revised: 01/04/2014 Document Reviewed: 06/24/2013 [facility] Patient Information 2015 [place], [place]. This information is not intended to replace advice given to you by your health care provider. Make sure you discuss any questions you have with your health care provider.   I think that you would greatly benefit from seeing a nutritionist.  If you are  interested, please call Dr. Wonda at [phone] to schedule an appointment.      Diabetes Mellitus and Nutrition    I think that you would greatly benefit from seeing a nutritionist. If this is something you are interested in, please call Dr Wonda at [phone] to schedule an appointment.   When you have diabetes (diabetes mellitus), it is very important to have healthy eating habits because your blood sugar (glucose) levels are greatly affected by what you eat and drink. Eating healthy foods in the appropriate amounts, at about the same times every day, can help you: Control your blood glucose. Lower your risk of heart disease. Improve your blood pressure. Reach or maintain a healthy weight.  Every person with diabetes is different, and each person has different needs for a meal plan. Your health care provider may recommend that you work with a diet and nutrition specialist (dietitian) to make a meal plan that is best for you. Your meal plan may vary depending on factors such as: The calories you need. The medicines you take. Your weight. Your blood glucose, blood pressure, and cholesterol levels. Your activity level. Other health conditions you have, such as heart or kidney disease.  How do carbohydrates affect me? Carbohydrates affect your blood glucose level more than any other type of food. Eating carbohydrates naturally increases the amount of glucose in your blood. Carbohydrate counting is a method for keeping track of how many carbohydrates you eat. Counting carbohydrates is important to keep your blood glucose at a healthy level, especially if you use insulin  or take certain oral diabetes medicines. It is important  to know how many carbohydrates you can safely have in each meal. This is different for every person. Your dietitian can help you calculate how many carbohydrates you should have at each meal and for snack. Foods that contain carbohydrates include: Bread, cereal,  rice, pasta, and crackers. Potatoes and corn. Peas, beans, and lentils. Milk and yogurt. Fruit and juice. Desserts, such as cakes, cookies, ice cream, and candy.  How does alcohol affect me? Alcohol can cause a sudden decrease in blood glucose (hypoglycemia), especially if you use insulin  or take certain oral diabetes medicines. Hypoglycemia can be a life-threatening condition. Symptoms of hypoglycemia (sleepiness, dizziness, and confusion) are similar to symptoms of having too much alcohol. If your health care provider says that alcohol is safe for you, follow these guidelines: Limit alcohol intake to no more than 1 drink per day for nonpregnant women and 2 drinks per day for men. One drink equals 12 oz of beer, 5 oz of wine, or 1 oz of hard liquor. Do not drink on an empty stomach. Keep yourself hydrated with water, diet soda, or unsweetened iced tea. Keep in mind that regular soda, juice, and other mixers may contain a lot of sugar and must be counted as carbohydrates.  What are tips for following this plan?  Reading food labels Start by checking the serving size on the label. The amount of calories, carbohydrates, fats, and other nutrients listed on the label are based on one serving of the food. Many foods contain more than one serving per package. Check the total grams (g) of carbohydrates in one serving. You can calculate the number of servings of carbohydrates in one serving by dividing the total carbohydrates by 15. For example, if a food has 30 g of total carbohydrates, it would be equal to 2 servings of carbohydrates. Check the number of grams (g) of saturated and trans fats in one serving. Choose foods that have low or no amount of these fats. Check the number of milligrams (mg) of sodium in one serving. Most people should limit total sodium intake to less than 2,300 mg per day. Always check the nutrition information of foods labeled as low-fat or nonfat. These foods may be  higher in added sugar or refined carbohydrates and should be avoided. Talk to your dietitian to identify your daily goals for nutrients listed on the label.  Shopping Avoid buying canned, premade, or processed foods. These foods tend to be high in fat, sodium, and added sugar. Shop around the outside edge of the grocery store. This includes fresh fruits and vegetables, bulk grains, fresh meats, and fresh dairy.  Cooking Use low-heat cooking methods, such as baking, instead of high-heat cooking methods like deep frying. Cook using healthy oils, such as olive, canola, or sunflower oil. Avoid cooking with butter, cream, or high-fat meats.  Meal planning Eat meals and snacks regularly, preferably at the same times every day. Avoid going long periods of time without eating. Eat foods high in fiber, such as fresh fruits, vegetables, beans, and whole grains. Talk to your dietitian about how many servings of carbohydrates you can eat at each meal. Eat 4-6 ounces of lean protein each day, such as lean meat, chicken, fish, eggs, or tofu. 1 ounce is equal to 1 ounce of meat, chicken, or fish, 1 egg, or 1/4 cup of tofu. Eat some foods each day that contain healthy fats, such as avocado, nuts, seeds, and fish.  Lifestyle  Check your blood glucose regularly. Exercise at least  30 minutes 5 or more days each week, or as told by your health care provider. Take medicines as told by your health care provider. Do not use any products that contain nicotine or tobacco, such as cigarettes and e-cigarettes. If you need help quitting, ask your health care provider. Work with a Veterinary surgeon or diabetes educator to identify strategies to manage stress and any emotional and social challenges.  What are some questions to ask my health care provider? Do I need to meet with a diabetes educator? Do I need to meet with a dietitian? What number can I call if I have questions? When are the best times to check my blood  glucose?  Where to find more information: American Diabetes Association: diabetes.org/food-and-fitness/food Academy of Nutrition and Dietetics: https://www.vargas.com/ [employer] of Diabetes and Digestive and Kidney Diseases (NIH): FindJewelers.cz  Summary A healthy meal plan will help you control your blood glucose and maintain a healthy lifestyle. Working with a diet and nutrition specialist (dietitian) can help you make a meal plan that is best for you. Keep in mind that carbohydrates and alcohol have immediate effects on your blood glucose levels. It is important to count carbohydrates and to use alcohol carefully. This information is not intended to replace advice given to you by your health care provider. Make sure you discuss any questions you have with your health care provider. Document Released: 05/17/2005 Document Revised: 09/24/2016 Document Reviewed: 09/24/2016 [employer]vier Interactive Patient Education  [employer].

## 2024-04-18 ENCOUNTER — Other Ambulatory Visit: Payer: Self-pay | Admitting: Family Medicine

## 2024-04-18 DIAGNOSIS — E1141 Type 2 diabetes mellitus with diabetic mononeuropathy: Secondary | ICD-10-CM

## 2024-04-26 ENCOUNTER — Other Ambulatory Visit: Payer: Self-pay | Admitting: Family Medicine

## 2024-04-26 DIAGNOSIS — E1141 Type 2 diabetes mellitus with diabetic mononeuropathy: Secondary | ICD-10-CM

## 2024-04-28 NOTE — Progress Notes (Unsigned)
 Cardiology Office Note    Date:  04/30/2024  ID:  Ezekeil Bethel, DOB 08-29-1966, MRN [id number] PCP:  Severa Rock HERO, FNP  Cardiologist:  Darryle ONEIDA Decent, MD  Electrophysiologist:  None   Chief Complaint: Follow up for CAD   History of Present Illness: .    Walton Digilio is a 58 y.o. male with visit-pertinent history of coronary artery disease s/p CABG x 5 in 2022 with LIMA to LAD, radial artery to OM, right SVG to diagonal 1, diagonal 2, PDA with endoscopic greater SVG on the right, left open radial artery harvest on 06/19/2021 by Dr. Shyrl, diabetes, hyperlipidemia, hypertension.  Patient was last in clinic on 04/23/2023 for follow-up.  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  1 year follow-up was recommended.  Today he presents for follow-up.  He reports that he has been doing well. Notes some occasional discomfort at his prior bypass incision such as when a seat belt is on the area.  He denies any chest pain, pressure or tightness on exertion.  He denies any shortness of breath, lower extremity edema, orthopnea or PND.  He denies any palpitations, presyncope or syncope.  He reports that overall he has been doing very well.  Patient reports that he does remain very active, notes that he has a young grandchild. ROS: .   Today he denies chest pain, shortness of breath, lower extremity edema, fatigue, palpitations, melena, hematuria, hemoptysis, diaphoresis, weakness, presyncope, syncope, orthopnea, and PND.  All other systems are reviewed and otherwise negative. Studies Reviewed: SABRA   EKG:  EKG is ordered today, personally reviewed, demonstrating  EKG Interpretation Date/Time:  Thursday April 30 2024 14:42:45 EDT Ventricular Rate:  85 PR Interval:  112 QRS Duration:  80 QT Interval:  368 QTC Calculation: 437 R Axis:   34  Text Interpretation: Normal sinus rhythm Normal ECG When compared with ECG of 23-Apr-2023 10:15, No significant change was found Confirmed by  Hilary Milks [phone]) on 04/30/2024 2:52:46 PM   CV Studies: Cardiac studies reviewed are outlined and summarized above. Otherwise please see EMR for full report. Cardiac Studies & Procedures   ______________________________________________________________________________________________ CARDIAC CATHETERIZATION  CARDIAC CATHETERIZATION 06/14/2021  Conclusion 1.  Left dominant coronary system with patent left main, severe diffuse mid LAD stenosis, severe mid circumflex stenosis, severe left PDA stenosis, severe diagonal stenosis. 2.  Nondominant RCA with moderate diffuse plaquing 3.  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  We will resume heparin  and consult cardiac surgery for consideration of CABG.  His targets are suboptimal and if he is not felt to have suitable anatomy for CABG, he could be treated with targeted PCI and ongoing medical therapy.  If his targets are felt to be suitable for grafting, he would be more completely revascularized with CABG.  Findings Coronary Findings Diagnostic  Dominance: Left  Left Anterior Descending Prox LAD to Mid LAD lesion is 90% stenosed.  First Diagonal Branch 1st Diag lesion is 95% stenosed.  Second Diagonal Branch 2nd Diag lesion is 50% stenosed.  Left Circumflex Mid Cx lesion is 80% stenosed. The lesion is focal and eccentric.  Left Posterior Descending Artery LPDA lesion is 70% stenosed.  Left Posterior Atrioventricular Artery LPAV lesion is 90% stenosed.  Right Coronary Artery Vessel is small. There is moderate diffuse disease throughout the vessel.  Intervention  No interventions have been documented.  ECHOCARDIOGRAM  ECHOCARDIOGRAM COMPLETE 06/15/2021  Narrative ECHOCARDIOGRAM REPORT    Patient Name:   TROI FLORENDO Date of Exam: 06/15/2021 Medical Rec #:  [id number]   Height:       67.0 in Accession #:     [id number]  Weight:       168.7 lb Date of Birth:  August 05, 1966    BSA:          1.881 m Patient Age:    55 years    BP:           143/91 mmHg Patient Gender: M           HR:           73 bpm. Exam Location:  Inpatient  Procedure: 2D Echo, Cardiac Doppler and Color Doppler  Indications:    NSTEMI I21.4  History:        Patient has no prior history of Echocardiogram examinations. CAD, Cath 06/14/21; Risk Factors:Hypertension, Diabetes and Dyslipidemia.  Sonographer:    Vernell Hammersmith RDCS Referring Phys: [id number] CHRISTOPHER RONALD BERGE  IMPRESSIONS   1. There appears to be fusion of left and right coronary cusps with functionally bicuspid aortic valve; trace AI; mild AS (mean gradient 11 mmHg). 2. Left ventricular ejection fraction, by estimation, is 60 to 65%. The left ventricle has normal function. The left ventricle has no regional wall motion abnormalities. Left ventricular diastolic parameters are consistent with Grade I diastolic dysfunction (impaired relaxation). 3. Right ventricular systolic function is normal. The right ventricular size is normal. Tricuspid regurgitation signal is inadequate for assessing PA pressure. 4. The mitral valve is normal in structure. Trivial mitral valve regurgitation. No evidence of mitral stenosis. 5. The aortic valve is bicuspid. Aortic valve regurgitation is trivial. Mild aortic valve stenosis. 6. The inferior vena cava is normal in size with greater than 50% respiratory variability, suggesting right atrial pressure of 3 mmHg.  FINDINGS Left Ventricle: Left ventricular ejection fraction, by estimation, is 60 to 65%. The left ventricle has normal function. The left ventricle has no regional wall motion abnormalities. The left ventricular internal cavity size was normal in size. There is no left ventricular hypertrophy. Left ventricular diastolic parameters are consistent with Grade I diastolic dysfunction (impaired relaxation).  Right Ventricle: The  right ventricular size is normal. Right ventricular systolic function is normal. Tricuspid regurgitation signal is inadequate for assessing PA pressure. The tricuspid regurgitant velocity is 1.84 m/s, and with an assumed right atrial pressure of 3 mmHg, the estimated right ventricular systolic pressure is 16.5 mmHg.  Left Atrium: Left atrial size was normal in size.  Right Atrium: Right atrial size was normal in size.  Pericardium: There is no evidence of pericardial effusion.  Mitral Valve: The mitral valve is normal in structure. Trivial mitral valve regurgitation. No evidence of mitral valve stenosis.  Tricuspid Valve: The tricuspid valve is normal in structure. Tricuspid valve regurgitation is trivial. No evidence of tricuspid stenosis.  Aortic Valve: The aortic valve is bicuspid. Aortic valve regurgitation is trivial. Mild aortic stenosis is present. Aortic valve mean gradient measures 11.0 mmHg. Aortic valve peak gradient measures 19.5 mmHg. Aortic valve area, by VTI measures 1.22 cm.  Pulmonic Valve: The pulmonic valve was normal in structure. Pulmonic valve regurgitation is not visualized. No evidence of pulmonic stenosis.  Aorta: The aortic root is normal in size and structure.  Venous: The inferior vena cava is normal in size with greater than 50% respiratory variability, suggesting right atrial pressure of 3 mmHg.  IAS/Shunts: The interatrial septum was not well visualized.  Additional Comments: There appears to be fusion of left and right coronary cusps with functionally bicuspid aortic valve; trace AI; mild AS (mean gradient 11 mmHg).   LEFT VENTRICLE PLAX 2D LVIDd:         4.60 cm   Diastology LVIDs:         3.30 cm   LV e' medial:    7.95 cm/s LV PW:         0.80 cm   LV E/e' medial:  10.5 LV IVS:        0.90 cm   LV e' lateral:   14.60 cm/s LVOT diam:     2.00 cm   LV E/e' lateral: 5.7 LV SV:         52 LV SV Index:   28 LVOT Area:     3.14 cm   RIGHT  VENTRICLE RV Basal diam:  3.30 cm  LEFT ATRIUM             Index        RIGHT ATRIUM           Index LA diam:        2.90 cm 1.54 cm/m   RA Area:     13.40 cm LA Vol (A2C):   26.4 ml 14.04 ml/m  RA Volume:   31.30 ml  16.64 ml/m LA Vol (A4C):   25.3 ml 13.45 ml/m LA Biplane Vol: 28.0 ml 14.89 ml/m AORTIC VALVE AV Area (Vmax):    1.18 cm AV Area (Vmean):   1.17 cm AV Area (VTI):     1.22 cm AV Vmax:           221.00 cm/s AV Vmean:          151.000 cm/s AV VTI:            0.427 m AV Peak Grad:      19.5 mmHg AV Mean Grad:      11.0 mmHg LVOT Vmax:         83.20 cm/s LVOT Vmean:        56.200 cm/s LVOT VTI:          0.166 m LVOT/AV VTI ratio: 0.39  AORTA Ao Root diam: 3.50 cm Ao Asc diam:  2.90 cm  MITRAL VALVE                TRICUSPID VALVE MV Area (PHT): 3.02 cm     TR Peak grad:   13.5 mmHg MV Decel Time: 251 msec     TR Vmax:        184.00 cm/s MV E velocity: 83.20 cm/s MV A velocity: 122.00 cm/s  SHUNTS MV E/A ratio:  0.68         Systemic VTI:  0.17 m Systemic Diam: 2.00 cm  Redell Shallow MD Electronically signed by Redell Shallow MD Signature Date/Time: 06/15/2021/11:52:44 AM    Final   TEE  ECHO INTRAOPERATIVE TEE 06/19/2021  Narrative *INTRAOPERATIVE TRANSESOPHAGEAL REPORT *    Patient Name:   ULYSESS WITZ    Date of Exam: 06/19/2021 Medical Rec #:  [id number]      Height:       67.0 in Accession #:    [id number]     Weight:       164.9 lb Date of Birth:  [birth year]       BSA:          1.86 m Patient  Age:    55 years       BP:           119/86 mmHg Patient Gender: M              HR:           68 bpm. Exam Location:  Anesthesiology  Transesophogeal exam was perform intraoperatively during surgical procedure. Patient was closely monitored under general anesthesia during the entirety of examination.  Indications:     CAD Sonographer:     Lyle Marc RDCS Performing Phys: Bernardino Mango MD Diagnosing Phys: Bernardino Mango  MD  Complications: No known complications during this procedure. POST-OP IMPRESSIONS _ Left Ventricle: The left ventricle is unchanged from pre-bypass. _ Right Ventricle: The right ventricle appears unchanged from pre-bypass. _ Aorta: The aorta appears unchanged from pre-bypass. _ Left Atrial Appendage: The left atrial appendage appears unchanged from pre-bypass. _ Aortic Valve: The aortic valve appears unchanged from pre-bypass. _ Mitral Valve: The mitral valve appears unchanged from pre-bypass. _ Tricuspid Valve: The tricuspid valve appears unchanged from pre-bypass. _ Pulmonic Valve: The pulmonic valve appears unchanged from pre-bypass. _ Interatrial Septum: The interatrial septum appears unchanged from pre-bypass. _ Pericardium: The pericardium appears unchanged from pre-bypass.  PRE-OP FINDINGS Left Ventricle: The left ventricle has mildly reduced systolic function, with an ejection fraction of 45-50%. The cavity size was normal. There is no increase in left ventricular wall thickness. There is no left ventricular hypertrophy.  Right Ventricle: The right ventricle has normal systolic function. The cavity was normal. There is no increase in right ventricular wall thickness.  Left Atrium: Left atrial size was normal in size. No left atrial/left atrial appendage thrombus was detected.  Right Atrium: Right atrial size was normal in size.  Interatrial Septum: No atrial level shunt detected by color flow Doppler.  Pericardium: There is no evidence of pericardial effusion.  Mitral Valve: The mitral valve is normal in structure. Mitral valve regurgitation is trivial by color flow Doppler.  Tricuspid Valve: The tricuspid valve was normal in structure. Tricuspid valve regurgitation was not visualized by color flow Doppler.  Aortic Valve: The aortic valve is tricuspid. Aortic valve regurgitation was not visualized by color flow Doppler. There is no stenosis of the aortic valve. There is  mild thickening and mild calcification present on the aortic valve right coronary, left coronary and non-coronary cusps with normal mobility.  Pulmonic Valve: The pulmonic valve was normal in structure. Pulmonic valve regurgitation is not visualized by color flow Doppler.   Aorta: The aortic root, ascending aorta and aortic arch are normal in size and structure. There is evidence of plaque in the descending aorta.   Bernardino Mango MD Electronically signed by Bernardino Mango MD Signature Date/Time: 06/19/2021/2:26:20 PM    Final        ______________________________________________________________________________________________       Current Reported Medications:.    Current Meds  Medication Sig   aspirin  81 MG EC tablet Take 1 tablet by mouth daily.   dapagliflozin  propanediol (FARXIGA ) 10 MG TABS tablet Take 1 tablet (10 mg total) by mouth daily.   ezetimibe  (ZETIA ) 10 MG tablet Take 1 tablet (10 mg total) by mouth daily.   losartan  (COZAAR ) 100 MG tablet Take 1 tablet (100 mg total) by mouth daily.   metFORMIN  (GLUCOPHAGE -XR) 500 MG 24 hr tablet TAKE 2 TABLETS BY MOUTH EVERY DAY WITH BREAKFAST   Multiple Vitamins-Minerals (MULTIVITAMIN WITH MINERALS) tablet Take 2 tablets by mouth daily.   Semaglutide ,  2 MG/DOSE, (OZEMPIC , 2 MG/DOSE,) 8 MG/3ML SOPN INJECT 2 MG UNDER THE SKIN WEEKLY   [DISCONTINUED] atorvastatin  (LIPITOR ) 40 MG tablet Take 1 tablet (40 mg total) by mouth daily.   [DISCONTINUED] metoprolol  tartrate (LOPRESSOR ) 25 MG tablet Take 1 tablet (25 mg total) by mouth 2 (two) times daily.    Physical Exam:    VS:  BP 132/78   Pulse 85   Ht 5' 7 (1.702 m)   Wt 141 lb 9.6 oz (64.2 kg)   SpO2 96%   BMI 22.18 kg/m    Wt Readings from Last 3 Encounters:  04/30/24 141 lb 9.6 oz (64.2 kg)  03/24/24 138 lb 6.4 oz (62.8 kg)  09/19/23 137 lb 3.2 oz (62.2 kg)    GEN: Well nourished, well developed in no acute distress NECK: No JVD; No carotid bruits CARDIAC: RRR,  1/6 systolic murmur, no rubs or gallops RESPIRATORY:  Clear to auscultation without rales, wheezing or rhonchi  ABDOMEN: Soft, non-tender, non-distended EXTREMITIES:  No edema; No acute deformity     Asessement and Plan:.    CAD: NSTEMI in October 2022 s/p CABG x 5 in 2022. Stable with no anginal symptoms. No indication for ischemic evaluation.  Heart healthy diet and regular cardiovascular exercise encouraged.  Reviewed ED precautions.  Continue aspirin  81 mg daily, Lipitor  40 mg daily, Zetia  10 mg daily, losartan  100 mg daily, Toprol  tartrate 25 mg twice daily.  Hyperlipidemia: Last lipid profile on 09/19/2023 indicated total cholesterol 82, triglycerides 54, HDL 47 and LDL 22.  Continue Lipitor  40 mg daily and Zetia  10 mg daily.  Hypertension: Blood pressure today 132/78. Notes that he does have some white coat hypertension. Continue current antihypertensive regimen.  Type II DM: Last hemoglobin A1c 7.9 on 03/24/24.  On Farxiga , metformin  and Ozempic .  Monitored and managed per PCP.  Aortic valve stenosis: Echo in 06/2021 indicated that there appeared to be fusion of left and right coronary cusp with functionally bicuspid aortic valve, trace AI, mild AS.  Patient denies any chest pain, shortness of breath, lower extreme edema, orthopnea or PND.  Will obtain echocardiogram for monitoring of aortic valve stenosis.   Disposition: F/u with Dr. Barbaraann in one year or sooner if needed.   Signed, Jasiel Belisle D Trishelle Devora, NP

## 2024-04-30 ENCOUNTER — Encounter: Payer: Self-pay | Admitting: Cardiology

## 2024-04-30 ENCOUNTER — Ambulatory Visit: Attending: Cardiology | Admitting: Cardiology

## 2024-04-30 VITALS — BP 132/78 | HR 85 | Ht 67.0 in | Wt 141.6 lb

## 2024-04-30 DIAGNOSIS — E1141 Type 2 diabetes mellitus with diabetic mononeuropathy: Secondary | ICD-10-CM

## 2024-04-30 DIAGNOSIS — I10 Essential (primary) hypertension: Secondary | ICD-10-CM

## 2024-04-30 DIAGNOSIS — E1169 Type 2 diabetes mellitus with other specified complication: Secondary | ICD-10-CM | POA: Diagnosis not present

## 2024-04-30 DIAGNOSIS — I25810 Atherosclerosis of coronary artery bypass graft(s) without angina pectoris: Secondary | ICD-10-CM | POA: Diagnosis not present

## 2024-04-30 DIAGNOSIS — E785 Hyperlipidemia, unspecified: Secondary | ICD-10-CM

## 2024-04-30 DIAGNOSIS — E782 Mixed hyperlipidemia: Secondary | ICD-10-CM

## 2024-04-30 DIAGNOSIS — I350 Nonrheumatic aortic (valve) stenosis: Secondary | ICD-10-CM

## 2024-04-30 MED ORDER — ATORVASTATIN CALCIUM 40 MG PO TABS: 40.0000 mg | ORAL_TABLET | Freq: Every day | ORAL | 3 refills | Status: AC

## 2024-04-30 MED ORDER — METOPROLOL TARTRATE 25 MG PO TABS: 25.0000 mg | ORAL_TABLET | Freq: Two times a day (BID) | ORAL | 0 refills | Status: DC

## 2024-04-30 NOTE — Patient Instructions (Signed)
 Medication Instructions:  No changes *If you need a refill on your cardiac medications before your next appointment, please call your pharmacy*  Lab Work: No labs  Testing/Procedures: Your physician has requested that you have an echocardiogram. Echocardiography is a painless test that uses sound waves to create images of your heart. It provides your doctor with information about the size and shape of your heart and how well your heart's chambers and valves are working. This procedure takes approximately one hour. There are no restrictions for this procedure. Please do NOT wear cologne, perfume, aftershave, or lotions (deodorant is allowed). Please arrive 15 minutes prior to your appointment time.  Please note: We ask at that you not bring children with you during ultrasound (echo/ vascular) testing. Due to room size and safety concerns, children are not allowed in the ultrasound rooms during exams. Our front office staff cannot provide observation of children in our lobby area while testing is being conducted. An adult accompanying a patient to their appointment will only be allowed in the ultrasound room at the discretion of the ultrasound technician under special circumstances. We apologize for any inconvenience.  Follow-Up: At [facility], you and your health needs are our priority.  As part of our continuing mission to provide you with exceptional heart care, our providers are all part of one team.  This team includes your primary Cardiologist (physician) and Advanced Practice Providers or APPs (Physician Assistants and Nurse Practitioners) who all work together to provide you with the care you need, when you need it.  Your next appointment:   1 year(s)  Provider:   Darryle ONEIDA Decent, MD

## 2024-05-01 ENCOUNTER — Other Ambulatory Visit: Payer: Self-pay | Admitting: Cardiovascular Disease

## 2024-05-06 ENCOUNTER — Other Ambulatory Visit: Payer: Self-pay

## 2024-05-06 ENCOUNTER — Other Ambulatory Visit: Payer: Self-pay | Admitting: Cardiovascular Disease

## 2024-05-06 ENCOUNTER — Encounter: Payer: Self-pay | Admitting: Family Medicine

## 2024-05-06 DIAGNOSIS — E1159 Type 2 diabetes mellitus with other circulatory complications: Secondary | ICD-10-CM

## 2024-05-06 DIAGNOSIS — E782 Mixed hyperlipidemia: Secondary | ICD-10-CM

## 2024-05-06 MED ORDER — LOSARTAN POTASSIUM 100 MG PO TABS: 100.0000 mg | ORAL_TABLET | Freq: Every day | ORAL | 1 refills | Status: AC

## 2024-06-08 ENCOUNTER — Ambulatory Visit (HOSPITAL_COMMUNITY)
Admission: RE | Admit: 2024-06-08 | Discharge: 2024-06-08 | Disposition: A | Discharge status: Home / Self Care | Source: Ambulatory Visit | Attending: Internal Medicine | Admitting: Internal Medicine

## 2024-06-08 DIAGNOSIS — I350 Nonrheumatic aortic (valve) stenosis: Secondary | ICD-10-CM | POA: Diagnosis not present

## 2024-06-08 LAB — ECHOCARDIOGRAM COMPLETE
AR max vel: 1.04 cm2
AV Area VTI: 1.1 cm2
AV Area mean vel: 1.04 cm2
AV Mean grad: 12 mmHg
AV Peak grad: 21.2 mmHg
Ao pk vel: 2.3 m/s
Area-P 1/2: 2.6 cm2
P 1/2 time: 387 ms
S' Lateral: 2.7 cm

## 2024-06-14 ENCOUNTER — Ambulatory Visit: Payer: Self-pay | Admitting: Cardiology

## 2024-07-07 ENCOUNTER — Encounter: Admitting: Family Medicine

## 2024-07-13 ENCOUNTER — Other Ambulatory Visit: Payer: Self-pay | Admitting: *Deleted

## 2024-07-13 DIAGNOSIS — E1141 Type 2 diabetes mellitus with diabetic mononeuropathy: Secondary | ICD-10-CM

## 2024-07-27 ENCOUNTER — Other Ambulatory Visit: Payer: Self-pay | Admitting: Cardiovascular Disease

## 2024-07-27 DIAGNOSIS — E782 Mixed hyperlipidemia: Secondary | ICD-10-CM

## 2024-08-06 ENCOUNTER — Other Ambulatory Visit: Payer: Self-pay | Admitting: Family Medicine

## 2024-08-06 DIAGNOSIS — E1141 Type 2 diabetes mellitus with diabetic mononeuropathy: Secondary | ICD-10-CM

## 2024-08-10 ENCOUNTER — Other Ambulatory Visit: Payer: Self-pay

## 2024-08-11 MED ORDER — METOPROLOL TARTRATE 25 MG PO TABS: 25.0000 mg | ORAL_TABLET | Freq: Two times a day (BID) | ORAL | 3 refills | Status: AC

## 2024-10-01 ENCOUNTER — Other Ambulatory Visit: Payer: Self-pay | Admitting: Family Medicine

## 2024-10-01 DIAGNOSIS — E1141 Type 2 diabetes mellitus with diabetic mononeuropathy: Secondary | ICD-10-CM

## 2024-10-02 ENCOUNTER — Other Ambulatory Visit: Payer: Self-pay | Admitting: Medical Genetics

## 2024-10-09 ENCOUNTER — Encounter: Payer: Self-pay | Admitting: Family Medicine

## 2024-10-09 ENCOUNTER — Ambulatory Visit: Admitting: Family Medicine

## 2024-10-09 VITALS — BP 128/78 | HR 64 | Temp 97.6°F | Ht 67.0 in | Wt 144.6 lb

## 2024-10-09 DIAGNOSIS — E1169 Type 2 diabetes mellitus with other specified complication: Secondary | ICD-10-CM

## 2024-10-09 DIAGNOSIS — E1159 Type 2 diabetes mellitus with other circulatory complications: Secondary | ICD-10-CM

## 2024-10-09 DIAGNOSIS — Z1211 Encounter for screening for malignant neoplasm of colon: Secondary | ICD-10-CM

## 2024-10-09 DIAGNOSIS — E1141 Type 2 diabetes mellitus with diabetic mononeuropathy: Secondary | ICD-10-CM

## 2024-10-09 DIAGNOSIS — Z125 Encounter for screening for malignant neoplasm of prostate: Secondary | ICD-10-CM

## 2024-10-09 DIAGNOSIS — Z0000 Encounter for general adult medical examination without abnormal findings: Secondary | ICD-10-CM

## 2024-10-09 DIAGNOSIS — N401 Enlarged prostate with lower urinary tract symptoms: Secondary | ICD-10-CM

## 2024-10-09 LAB — BAYER DCA HB A1C WAIVED: HB A1C (BAYER DCA - WAIVED): 8.4 % — ABNORMAL HIGH (ref 4.8–5.6)

## 2024-10-09 NOTE — Progress Notes (Signed)
 "  Complete physical exam  Patient: Matthew Mann   DOB: [birth year]   59 y.o. Male  MRN: [id number]  Subjective:    Chief Complaint  Patient presents with   Annual Exam    Matthew Mann is a 59 y.o. male who presents today for a complete physical exam. He reports consuming a general diet. Home exercise routine includes treadmill. He generally feels well. He reports sleeping well. He does not have additional problems to discuss today.    Most recent fall risk assessment:    10/09/2024   10:20 AM  Fall Risk   Falls in the past year? 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/19/2021   History of non-ST elevation myocardial infarction (NSTEMI) 06/14/2021   Type 2 diabetes mellitus with diabetic mononeuropathy, without long-term current use of insulin  (HCC) 12/28/2019   Hyperlipidemia associated with type 2 diabetes mellitus (HCC) 07/19/2016   BPH with obstruction/lower urinary tract symptoms 07/31/2010   Hypertension associated with diabetes (HCC) 07/31/2010   Past Medical History:  Diagnosis Date   Diabetes mellitus without complication (HCC)    Hyperlipidemia    Hypertension    Past Surgical History:  Procedure Laterality Date   CORONARY ARTERY BYPASS GRAFT N/A 06/19/2021   Procedure: CORONARY ARTERY BYPASS GRAFTING TIMES 5;  Surgeon: Shyrl Linnie KIDD, MD;  Location: [facility] OR;  Service: Open Heart Surgery;  Laterality: N/A;   ENDOVEIN HARVEST OF GREATER SAPHENOUS VEIN Right 06/19/2021   Procedure: ENDOVEIN HARVEST OF GREATER SAPHENOUS VEIN;  Surgeon: Shyrl Linnie KIDD, MD;   Location: [facility] OR;  Service: Open Heart Surgery;  Laterality: Right;   LEFT HEART CATH AND CORONARY ANGIOGRAPHY N/A 06/14/2021   Procedure: LEFT HEART CATH AND CORONARY ANGIOGRAPHY;  Surgeon: Wonda Sharper, MD;  Location: [facility] INVASIVE CV LAB;  Service: Cardiovascular;  Laterality: N/A;   RADIAL ARTERY HARVEST Left 06/19/2021   Procedure: RADIAL ARTERY HARVEST;  Surgeon: Shyrl Linnie KIDD, MD;  Location: [facility] OR;  Service: Open Heart Surgery;  Laterality: Left;   TEE WITHOUT CARDIOVERSION N/A 06/19/2021   Procedure: TRANSESOPHAGEAL ECHOCARDIOGRAM (TEE);  Surgeon: Shyrl Linnie KIDD, MD;  Location: [facility] OR;  Service: Open Heart Surgery;  Laterality: N/A;   Social History[1] Social History   Socioeconomic History   Marital status: Married    Spouse name: Not on file   Number of children: Not on file   Years of education: Not on file   Highest education level: Master's degree (e.g., MA, MS, MEng, MEd, MSW, MBA)  Occupational History   Not on file  Tobacco Use   Smoking status: Never   Smokeless tobacco: Never  Vaping Use   Vaping status: Never Used  Substance and Sexual Activity   Alcohol use: Never   Drug use: Never   Sexual activity: Yes  Other Topics Concern   Not on file  Social History Narrative   Not on file   Social Drivers of Health   Tobacco Use:  Low Risk (10/09/2024)   Patient History    Smoking Tobacco Use: Never    Smokeless Tobacco Use: Never    Passive Exposure: Not on file  Financial Resource Strain: Low Risk (10/05/2024)   Overall Financial Resource Strain (CARDIA)    Difficulty of Paying Living Expenses: Not hard at all  Food Insecurity: No Food Insecurity (10/05/2024)   Epic    Worried About Radiation Protection Practitioner of Food in the Last Year: Never true    Ran Out of Food in the Last Year: Never true  Transportation Needs: No Transportation Needs (10/05/2024)   Epic    Lack of Transportation (Medical): No    Lack of Transportation (Non-Medical): No  Physical Activity:  Sufficiently Active (10/05/2024)   Exercise Vital Sign    Days of Exercise per Week: 5 days    Minutes of Exercise per Session: 30 min  Stress: No Stress Concern Present (10/05/2024)   [employer] of Occupational Health - Occupational Stress Questionnaire    Feeling of Stress: Not at all  Social Connections: Socially Integrated (10/05/2024)   Social Connection and Isolation Panel    Frequency of Communication with Friends and Family: Three times a week    Frequency of Social Gatherings with Friends and Family: Twice a week    Attends Religious Services: More than 4 times per year    Active Member of Clubs or Organizations: Yes    Attends Banker Meetings: More than 4 times per year    Marital Status: Married  Catering Manager Violence: Unknown (12/04/2021)   Received from [facility]   HITS    Physically Hurt: Not on file    Insult or Talk Down To: Not on file    Threaten Physical Harm: Not on file    Scream or Curse: Not on file  Depression (PHQ2-9): Low Risk (10/09/2024)   Depression (PHQ2-9)    PHQ-2 Score: 0  Alcohol Screen: Not on file  Housing: Low Risk (10/05/2024)   Epic    Unable to Pay for Housing in the Last Year: No    Number of Times Moved in the Last Year: 0    Homeless in the Last Year: No  Utilities: Not on file  Health Literacy: Not on file   Family Status  Relation Name Status   Mother  Deceased   Father  Deceased   Sister 4 Alive   Brother 2 Alive   Son 2 Alive   MGM  Deceased   MGF  Deceased   PGM  Deceased   PGF  Deceased  No partnership data on file   Family History  Problem Relation Age of Onset   Hypertension Mother    Heart disease Sister    Heart attack Maternal Grandfather    Allergies[2]    Patient Care Team: Severa Rock HERO, FNP as PCP - General (Family Medicine) O'Neal, Darryle Ned, MD as PCP - Cardiology (Cardiology) [facility] [place], Josefa HERO, NP as Nurse Practitioner (Cardiology)   Show/hide medication  list[3]  ROS per HPI     Objective:     BP 128/78   Pulse 64   Temp 97.6 F (36.4 C)   Ht 5' 7 (1.702 m)   Wt 144 lb 9.6 oz (65.6 kg)   SpO2 97%   BMI 22.65 kg/m  BP Readings from Last 3 Encounters:  10/09/24 128/78  04/30/24 132/78  03/24/24 (!) 161/91   Wt Readings from Last 3 Encounters:  10/09/24 144 lb  9.6 oz (65.6 kg)  04/30/24 141 lb 9.6 oz (64.2 kg)  03/24/24 138 lb 6.4 oz (62.8 kg)   SpO2 Readings from Last 3 Encounters:  10/09/24 97%  04/30/24 96%  03/24/24 100%      Physical Exam Vitals and nursing note reviewed.  Constitutional:      General: He is not in acute distress.    Appearance: Normal appearance. He is well-developed, well-groomed and normal weight. He is not ill-appearing, toxic-appearing or diaphoretic.  HENT:     Head: Normocephalic and atraumatic.     Jaw: There is normal jaw occlusion.     Right Ear: Hearing, tympanic membrane, ear canal and external ear normal.     Left Ear: Hearing, tympanic membrane, ear canal and external ear normal.     Nose: Nose normal.     Mouth/Throat:     Lips: Pink.     Mouth: Mucous membranes are moist.     Pharynx: Oropharynx is clear. Uvula midline.  Eyes:     General: Lids are normal.     Extraocular Movements: Extraocular movements intact.     Conjunctiva/sclera: Conjunctivae normal.     Pupils: Pupils are equal, round, and reactive to light.  Neck:     Thyroid: No thyroid mass, thyromegaly or thyroid tenderness.     Vascular: No carotid bruit or JVD.     Trachea: Trachea and phonation normal.  Cardiovascular:     Rate and Rhythm: Normal rate and regular rhythm.     Chest Wall: PMI is not displaced.     Pulses: Normal pulses.          Dorsalis pedis pulses are 2+ on the right side and 2+ on the left side.       Posterior tibial pulses are 2+ on the right side and 2+ on the left side.     Heart sounds: Normal heart sounds. No murmur heard.    No friction rub. No gallop.  Pulmonary:     Effort:  Pulmonary effort is normal. No respiratory distress.     Breath sounds: Normal breath sounds. No wheezing.  Chest:     Chest wall: No mass.  Breasts:    Breasts are symmetrical.  Abdominal:     General: Abdomen is flat. Bowel sounds are normal. There is no distension or abdominal bruit.     Palpations: Abdomen is soft. There is no hepatomegaly or splenomegaly.     Tenderness: There is no abdominal tenderness. There is no right CVA tenderness or left CVA tenderness.     Hernia: No hernia is present.  Musculoskeletal:        General: Normal range of motion.     Cervical back: Full passive range of motion without pain, normal range of motion and neck supple.     Right lower leg: No edema.     Left lower leg: No edema.  Feet:     Right foot:     Skin integrity: Skin integrity normal.     Toenail Condition: Right toenails are normal.     Left foot:     Skin integrity: Skin integrity normal.     Toenail Condition: Left toenails are normal.  Lymphadenopathy:     Cervical: No cervical adenopathy.     Upper Body:     Right upper body: No supraclavicular, axillary or pectoral adenopathy.     Left upper body: No supraclavicular, axillary or pectoral adenopathy.  Skin:    General: Skin is  warm and dry.     Capillary Refill: Capillary refill takes less than 2 seconds.     Coloration: Skin is not cyanotic, jaundiced or pale.     Findings: No rash.  Neurological:     General: No focal deficit present.     Mental Status: He is alert and oriented to person, place, and time.     Sensory: Sensation is intact.     Motor: Motor function is intact.     Coordination: Coordination is intact.     Gait: Gait is intact.     Deep Tendon Reflexes: Reflexes are normal and symmetric.  Psychiatric:        Attention and Perception: Attention and perception normal.        Mood and Affect: Mood and affect normal.        Speech: Speech normal.        Behavior: Behavior normal. Behavior is cooperative.         Thought Content: Thought content normal.        Cognition and Memory: Cognition and memory normal.        Judgment: Judgment normal.       Last CBC Lab Results  Component Value Date   WBC 8.9 09/19/2023   HGB 16.6 09/19/2023   HCT 48.9 09/19/2023   MCV 90 09/19/2023   MCH 30.5 09/19/2023   RDW 12.5 09/19/2023   PLT 245 09/19/2023   Last metabolic panel Lab Results  Component Value Date   GLUCOSE 125 (H) 03/24/2024   NA 141 03/24/2024   K 4.2 03/24/2024   CL 103 03/24/2024   CO2 22 03/24/2024   BUN 10 03/24/2024   CREATININE 0.96 03/24/2024   EGFR 93 03/24/2024   CALCIUM  9.3 03/24/2024   PROT 6.5 03/24/2024   ALBUMIN  4.4 03/24/2024   LABGLOB 2.1 03/24/2024   AGRATIO 2.1 01/10/2023   BILITOT 1.5 (H) 03/24/2024   ALKPHOS 58 03/24/2024   AST 23 03/24/2024   ALT 29 03/24/2024   ANIONGAP 15 09/03/2021   Last lipids Lab Results  Component Value Date   CHOL 82 (L) 09/19/2023   HDL 47 09/19/2023   LDLCALC 22 09/19/2023   TRIG 54 09/19/2023   CHOLHDL 1.7 09/19/2023   Last hemoglobin A1c Lab Results  Component Value Date   HGBA1C 7.9 (H) 03/24/2024   Last vitamin B12 and Folate Lab Results  Component Value Date   VITAMINB12 684 09/19/2023        Assessment & Plan:    Routine Health Maintenance and Physical Exam  Immunization History  Administered Date(s) Administered   Janssen (J&J) SARS-COV-2 Vaccination 02/02/2020    Health Maintenance  Topic Date Due   Fecal DNA (Cologuard)  08/03/2024   Diabetic kidney evaluation - Urine ACR  09/18/2024   HEMOGLOBIN A1C  09/24/2024   COVID-19 Vaccine (2 - 2025-26 season) 10/25/2024 (Originally 05/04/2024)   Influenza Vaccine  12/01/2024 (Originally 04/03/2024)   Zoster Vaccines- Shingrix (1 of 2) 01/06/2025 (Originally 10/09/2017)   Hepatitis B Vaccines 19-59 Average Risk (1 of 3 - 19+ 3-dose series) 03/24/2025 (Originally 10/09/1986)   DTaP/Tdap/Td (1 - Tdap) 10/09/2025 (Originally 10/09/1986)   Pneumococcal Vaccine:  50+ Years (1 of 2 - PCV) 10/09/2025 (Originally 10/09/1986)   Hepatitis C Screening  10/09/2025 (Originally 10/09/1985)   HIV Screening  10/09/2025 (Originally 10/09/1982)   Diabetic kidney evaluation - eGFR measurement  03/24/2025   FOOT EXAM  03/24/2025   OPHTHALMOLOGY EXAM  03/27/2025   HPV VACCINES (  No Doses Required) Completed   Meningococcal B Vaccine  Aged Out    Discussed health benefits of physical activity, and encouraged him to engage in regular exercise appropriate for his age and condition.  Problem List Items Addressed This Visit       Cardiovascular and Mediastinum   Hypertension associated with diabetes (HCC)   Relevant Orders   CBC with Differential/Platelet   CMP14+EGFR     Endocrine   Hyperlipidemia associated with type 2 diabetes mellitus (HCC)   Relevant Orders   Lipid panel   Type 2 diabetes mellitus with diabetic mononeuropathy, without long-term current use of insulin  (HCC)   Relevant Orders   Bayer DCA Hb A1c Waived   Vitamin B12   Microalbumin / creatinine urine ratio     Genitourinary   BPH with obstruction/lower urinary tract symptoms   Relevant Orders   PSA   Other Visit Diagnoses       Annual physical exam    -  Primary   Relevant Orders   TSH   T4, free   PSA     Screening for prostate cancer         Screening for colorectal cancer       Relevant Orders   Cologuard      Return in about 6 months (around 04/08/2025), or if symptoms worsen or fail to improve, for DM.     Rosaline Bruns, FNP      [1]  Social History Tobacco Use   Smoking status: Never   Smokeless tobacco: Never  Vaping Use   Vaping status: Never Used  Substance Use Topics   Alcohol use: Never   Drug use: Never  [2]  Allergies Allergen Reactions   Ace Inhibitors Other (See Comments), Swelling and Cough    Tongue swelling and cough.   Tropicamide Nausea Only and Nausea And Vomiting  [3]  Outpatient Medications Prior to Visit  Medication Sig   aspirin  81 MG  EC tablet Take 1 tablet by mouth daily.   atorvastatin  (LIPITOR ) 40 MG tablet Take 1 tablet (40 mg total) by mouth daily.   dapagliflozin  propanediol (FARXIGA ) 10 MG TABS tablet Take 1 tablet (10 mg total) by mouth daily.   ezetimibe  (ZETIA ) 10 MG tablet TAKE 1 TABLET DAILY (CALL OFFICE TO SCHEDULE YEARLY APPOINTMENT WITH DOCTOR ONEAL FOR AUGUST 2025 BEFORE ANY MORE REFILLS, [id number])   losartan  (COZAAR ) 100 MG tablet Take 1 tablet (100 mg total) by mouth daily.   metFORMIN  (GLUCOPHAGE -XR) 500 MG 24 hr tablet TAKE 2 TABLETS DAILY WITH BREAKFAST   metoprolol  tartrate (LOPRESSOR ) 25 MG tablet Take 1 tablet (25 mg total) by mouth 2 (two) times daily.   Multiple Vitamins-Minerals (MULTIVITAMIN WITH MINERALS) tablet Take 2 tablets by mouth daily.   Semaglutide , 2 MG/DOSE, (OZEMPIC , 2 MG/DOSE,) 8 MG/3ML SOPN INJECT 2 MG UNDER THE SKIN WEEKLY   No facility-administered medications prior to visit.   "

## 2024-10-29 ENCOUNTER — Other Ambulatory Visit

## 2025-04-08 ENCOUNTER — Ambulatory Visit: Admitting: Family Medicine
